# Patient Record
Sex: Male | Born: 1955 | Race: Black or African American | Hispanic: No | Marital: Single | Smoking: Current every day smoker
Health system: Southern US, Community
[De-identification: ages and names within clinical notes are randomized; demographics above are authoritative.]

## PROBLEM LIST (undated history)

## (undated) DIAGNOSIS — I10 Essential (primary) hypertension: Secondary | ICD-10-CM

## (undated) DIAGNOSIS — T7840XA Allergy, unspecified, initial encounter: Secondary | ICD-10-CM

## (undated) DIAGNOSIS — E785 Hyperlipidemia, unspecified: Secondary | ICD-10-CM

## (undated) DIAGNOSIS — Z86010 Personal history of colonic polyps: Secondary | ICD-10-CM

## (undated) HISTORY — PX: POLYPECTOMY: SHX149

## (undated) HISTORY — PX: TONSILLECTOMY: SUR1361

## (undated) HISTORY — DX: Essential (primary) hypertension: I10

## (undated) HISTORY — DX: Hyperlipidemia, unspecified: E78.5

## (undated) HISTORY — PX: COLONOSCOPY: SHX174

## (undated) HISTORY — DX: Allergy, unspecified, initial encounter: T78.40XA

## (undated) HISTORY — DX: Personal history of colonic polyps: Z86.010

---

## 2016-12-22 ENCOUNTER — Encounter: Payer: Self-pay | Admitting: Family Medicine

## 2016-12-30 ENCOUNTER — Encounter: Payer: Self-pay | Admitting: Internal Medicine

## 2017-02-14 ENCOUNTER — Ambulatory Visit (AMBULATORY_SURGERY_CENTER): Payer: Self-pay | Admitting: *Deleted

## 2017-02-14 VITALS — Ht 73.0 in | Wt 195.2 lb

## 2017-02-14 DIAGNOSIS — Z1211 Encounter for screening for malignant neoplasm of colon: Secondary | ICD-10-CM

## 2017-02-14 NOTE — Progress Notes (Signed)
No egg or soy allergy known to patient  No issues with past sedation with any surgeries  or procedures, no intubation problems  No diet pills per patient No home 02 use per patient  No blood thinners per patient  Pt denies issues with constipation  No A fib or A flutter  EMMI video sent to pt's e mail  

## 2017-02-20 ENCOUNTER — Telehealth: Payer: Self-pay | Admitting: Internal Medicine

## 2017-02-21 NOTE — Telephone Encounter (Signed)
Called pt and obtained email. Re-entered emmi Angela/PV

## 2017-02-22 ENCOUNTER — Telehealth: Payer: Self-pay | Admitting: Internal Medicine

## 2017-02-22 NOTE — Telephone Encounter (Signed)
Corrected information on EMMI. Notified patient that he should be able to access this now.

## 2017-02-28 ENCOUNTER — Encounter: Payer: Self-pay | Admitting: Internal Medicine

## 2017-02-28 ENCOUNTER — Ambulatory Visit (AMBULATORY_SURGERY_CENTER): Payer: Self-pay | Admitting: Internal Medicine

## 2017-02-28 ENCOUNTER — Other Ambulatory Visit: Payer: Self-pay | Admitting: Internal Medicine

## 2017-02-28 VITALS — BP 113/77 | HR 70 | Temp 99.3°F | Resp 13 | Ht 73.0 in | Wt 195.0 lb

## 2017-02-28 DIAGNOSIS — Z1211 Encounter for screening for malignant neoplasm of colon: Secondary | ICD-10-CM

## 2017-02-28 DIAGNOSIS — D125 Benign neoplasm of sigmoid colon: Secondary | ICD-10-CM

## 2017-02-28 DIAGNOSIS — Z1212 Encounter for screening for malignant neoplasm of rectum: Secondary | ICD-10-CM

## 2017-02-28 DIAGNOSIS — D124 Benign neoplasm of descending colon: Secondary | ICD-10-CM

## 2017-02-28 DIAGNOSIS — D123 Benign neoplasm of transverse colon: Secondary | ICD-10-CM

## 2017-02-28 MED ORDER — SODIUM CHLORIDE 0.9 % IV SOLN: 500.0000 mL | INTRAVENOUS | Status: DC

## 2017-02-28 NOTE — Progress Notes (Signed)
A and O x3. Report to RN. Tolerated MAC anesthesia well.

## 2017-02-28 NOTE — Patient Instructions (Addendum)
I found and removed 10 polyps today. I will let you know pathology results and when to have another routine colonoscopy by mail and/or My Chart.  You also have a condition called diverticulosis - common and not usually a problem. Please read the handout provided. I also saw enlarged hemorrhoids inside.  I appreciate the opportunity to care for you. Gatha Mayer, MD, FACG   YOU HAD AN ENDOSCOPIC PROCEDURE TODAY AT [facility]:   Refer to the procedure report that was given to you for any specific questions about what was found during the examination.  If the procedure report does not answer your questions, please call your gastroenterologist to clarify.  If you requested that your care partner not be given the details of your procedure findings, then the procedure report has been included in a sealed envelope for you to review at your convenience later.  YOU SHOULD EXPECT: Some feelings of bloating in the abdomen. Passage of more gas than usual.  Walking can help get rid of the air that was put into your GI tract during the procedure and reduce the bloating. If you had a lower endoscopy (such as a colonoscopy or flexible sigmoidoscopy) you may notice spotting of blood in your stool or on the toilet paper. If you underwent a bowel prep for your procedure, you may not have a normal bowel movement for a few days.  Please Note:  You might notice some irritation and congestion in your nose or some drainage.  This is from the oxygen used during your procedure.  There is no need for concern and it should clear up in a day or so.  SYMPTOMS TO REPORT IMMEDIATELY:   Following lower endoscopy (colonoscopy or flexible sigmoidoscopy):  Excessive amounts of blood in the stool  Significant tenderness or worsening of abdominal pains  Swelling of the abdomen that is new, acute  Fever of 100F or higher   For urgent or emergent issues, a gastroenterologist can be reached at any  hour by calling [phone].   No ASA products or NSAIDS for 2 weeks! Please read all handouts given to you by your recovery nurse.   DIET:  We do recommend a small meal at first, but then you may proceed to your regular diet.  Drink plenty of fluids but you should avoid alcoholic beverages for 24 hours.  ACTIVITY:  You should plan to take it easy for the rest of today and you should NOT DRIVE or use heavy machinery until tomorrow (because of the sedation medicines used during the test).    FOLLOW UP: Our staff will call the number listed on your records the next business day following your procedure to check on you and address any questions or concerns that you may have regarding the information given to you following your procedure. If we do not reach you, we will leave a message.  However, if you are feeling well and you are not experiencing any problems, there is no need to return our call.  We will assume that you have returned to your regular daily activities without incident.  If any biopsies were taken you will be contacted by phone or by letter within the next 1-3 weeks.  Please call us at [phone] if you have not heard about the biopsies in 3 weeks.    SIGNATURES/CONFIDENTIALITY: You and/or your care partner have signed paperwork which will be entered into your electronic medical record.  These signatures attest to  the fact that that the information above on your After Visit Summary has been reviewed and is understood.  Full responsibility of the confidentiality of this discharge information lies with you and/or your care-partner.  Thank you for letting us take care of your healthcare needs today.

## 2017-02-28 NOTE — Progress Notes (Signed)
Called to room to assist during endoscopic procedure.  Patient ID and intended procedure confirmed with present staff. Received instructions for my participation in the procedure from the performing physician.  

## 2017-02-28 NOTE — Op Note (Signed)
[facility] Patient Name: Samuel Bates Procedure Date: 02/28/2017 2:50 PM MRN: [id number] Endoscopist: Gatha Mayer , MD Age: 61 Referring MD:  Date of Birth: 1956/04/24 Gender: Male Account #: [id number] Procedure:                Colonoscopy Indications:              Screening for colorectal malignant neoplasm, This                            is the patient's first colonoscopy Medicines:                Propofol per Anesthesia, Monitored Anesthesia Care Procedure:                Pre-Anesthesia Assessment:                           - Prior to the procedure, a History and Physical                            was performed, and patient medications and                            allergies were reviewed. The patient's tolerance of                            previous anesthesia was also reviewed. The risks                            and benefits of the procedure and the sedation                            options and risks were discussed with the patient.                            All questions were answered, and informed consent                            was obtained. Prior Anticoagulants: The patient has                            taken no previous anticoagulant or antiplatelet                            agents. ASA Grade Assessment: II - A patient with                            mild systemic disease. After reviewing the risks                            and benefits, the patient was deemed in                            satisfactory condition to undergo the procedure.  After obtaining informed consent, the colonoscope                            was passed under direct vision. Throughout the                            procedure, the patient's blood pressure, pulse, and                            oxygen saturations were monitored continuously. The                            Colonoscope was introduced through the anus and   advanced to the the cecum, identified by                            appendiceal orifice and ileocecal valve. The                            colonoscopy was performed without difficulty. The                            patient tolerated the procedure well. The quality                            of the bowel preparation was good. The bowel                            preparation used was Miralax. The ileocecal valve,                            appendiceal orifice, and rectum were photographed. Scope In: 3:00:28 PM Scope Out: 3:25:13 PM Scope Withdrawal Time: 0 hours 20 minutes 34 seconds  Total Procedure Duration: 0 hours 24 minutes 45 seconds  Findings:                 The perianal and digital rectal examinations were                            normal. Pertinent negatives include normal prostate                            (size, shape, and consistency).                           Two pedunculated polyps were found in the sigmoid                            colon. The polyps were 12 to 15 mm in size. These                            polyps were removed with a hot snare. Resection and  retrieval were complete. Verification of patient                            identification for the specimen was done. Estimated                            blood loss: none.                           Eight sessile polyps were found in the descending                            colon and transverse colon. The polyps were 3 to 8                            mm in size. These polyps were removed with a cold                            snare. Resection and retrieval were complete.                            Verification of patient identification for the                            specimen was done. Estimated blood loss was minimal.                           Many small and large-mouthed diverticula were found                            in the sigmoid colon.                           Internal  hemorrhoids were found during retroflexion.                           The exam was otherwise without abnormality on                            direct and retroflexion views. Complications:            No immediate complications. Estimated Blood Loss:     Estimated blood loss was minimal. Impression:               - Two 12 to 15 mm polyps in the sigmoid colon,                            removed with a hot snare. Resected and retrieved.                            Proximal 12 mm and distal 15 mm - separate bottles                           - Eight 3 to 8 mm polyps in the  descending colon                            and in the transverse colon, removed with a cold                            snare. Resected and retrieved.                           10 POLYPS TOTAL                           - Diverticulosis in the sigmoid colon.                           - Internal hemorrhoids.                           - The examination was otherwise normal on direct                            and retroflexion views. Recommendation:           - Patient has a contact number available for                            emergencies. The signs and symptoms of potential                            delayed complications were discussed with the                            patient. Return to normal activities tomorrow.                            Written discharge instructions were provided to the                            patient.                           - Resume previous diet.                           - Continue present medications.                           - No aspirin, ibuprofen, naproxen, or other                            non-steroidal anti-inflammatory drugs for 2 weeks                            after polyp removal.                           - Repeat colonoscopy date to be determined after  pending pathology results are reviewed for                            surveillance. Gatha Mayer, MD 02/28/2017 3:35:22 PM This report has been signed electronically.

## 2017-03-01 ENCOUNTER — Telehealth: Payer: Self-pay | Admitting: *Deleted

## 2017-03-01 NOTE — Telephone Encounter (Signed)
  Follow up Call-  Call back number 02/28/2017  Post procedure Call Back phone  # [phone]  Permission to leave phone message Yes  Some recent data might be hidden     Patient questions:  Do you have a fever, pain , or abdominal swelling? No. Pain Score  0 *  Have you tolerated food without any problems? Yes.    Have you been able to return to your normal activities? Yes.    Do you have any questions about your discharge instructions: Diet   No. Medications  No. Follow up visit  No.  Do you have questions or concerns about your Care? No.  Actions: * If pain score is 4 or above: No action needed, pain <4.

## 2017-03-07 ENCOUNTER — Encounter: Payer: Self-pay | Admitting: Internal Medicine

## 2017-03-07 DIAGNOSIS — Z86010 Personal history of colonic polyps: Secondary | ICD-10-CM

## 2017-03-07 DIAGNOSIS — Z860101 Personal history of adenomatous and serrated colon polyps: Secondary | ICD-10-CM

## 2017-03-07 HISTORY — DX: Personal history of colonic polyps: Z86.010

## 2017-03-07 HISTORY — DX: Personal history of adenomatous and serrated colon polyps: Z86.0101

## 2018-02-28 ENCOUNTER — Encounter: Payer: Self-pay | Admitting: Internal Medicine

## 2018-03-30 ENCOUNTER — Other Ambulatory Visit: Payer: Self-pay

## 2018-03-30 ENCOUNTER — Ambulatory Visit (AMBULATORY_SURGERY_CENTER): Payer: Self-pay | Admitting: *Deleted

## 2018-03-30 VITALS — Ht 73.0 in | Wt 203.0 lb

## 2018-03-30 DIAGNOSIS — Z86010 Personal history of colonic polyps: Secondary | ICD-10-CM

## 2018-03-30 MED ORDER — BISACODYL 5 MG PO TBEC
DELAYED_RELEASE_TABLET | ORAL | 0 refills | Status: DC
Start: 2018-03-30 — End: 2018-04-09

## 2018-03-30 MED ORDER — POLYETHYLENE GLYCOL 3350 17 GM/SCOOP PO POWD: ORAL | 0 refills | Status: DC

## 2018-03-30 NOTE — Progress Notes (Signed)
No egg or soy allergy known to patient  No issues with past sedation with any surgeries  or procedures, no intubation problems  No diet pills per patient No home 02 use per patient  No blood thinners per patient  Pt denies issues with constipation  No A fib or A flutter  EMMI video offered and declined by the patient. Rx for Miralax and Ducolax printed for the patient to take to his pharmacy.(patient has an [place] card)

## 2018-04-09 ENCOUNTER — Ambulatory Visit (AMBULATORY_SURGERY_CENTER): Payer: Self-pay | Admitting: Internal Medicine

## 2018-04-09 ENCOUNTER — Encounter: Payer: Self-pay | Admitting: Internal Medicine

## 2018-04-09 VITALS — BP 111/72 | HR 74 | Temp 99.6°F | Resp 18 | Ht 73.0 in | Wt 203.0 lb

## 2018-04-09 DIAGNOSIS — Z860100 Personal history of colon polyps, unspecified: Secondary | ICD-10-CM

## 2018-04-09 DIAGNOSIS — D122 Benign neoplasm of ascending colon: Secondary | ICD-10-CM

## 2018-04-09 DIAGNOSIS — D126 Benign neoplasm of colon, unspecified: Secondary | ICD-10-CM

## 2018-04-09 DIAGNOSIS — D125 Benign neoplasm of sigmoid colon: Secondary | ICD-10-CM

## 2018-04-09 DIAGNOSIS — Z860101 Personal history of adenomatous and serrated colon polyps: Secondary | ICD-10-CM

## 2018-04-09 DIAGNOSIS — K635 Polyp of colon: Secondary | ICD-10-CM

## 2018-04-09 DIAGNOSIS — D123 Benign neoplasm of transverse colon: Secondary | ICD-10-CM

## 2018-04-09 MED ORDER — SODIUM CHLORIDE 0.9 % IV SOLN: 500.0000 mL | Freq: Once | INTRAVENOUS | Status: DC

## 2018-04-09 NOTE — Progress Notes (Signed)
Pt's states no medical or surgical changes since previsit or office visit. 

## 2018-04-09 NOTE — Progress Notes (Signed)
A/ox3 pleased with MAC, report to RN 

## 2018-04-09 NOTE — Patient Instructions (Addendum)
I removed 5 tiny polyps today and saw the internal hemorrhoids (these are cause of bleeding you described).   I will let you know pathology results and when to have another routine colonoscopy by mail and/or My Chart.  I appreciate the opportunity to care for you. Gatha Mayer, MD, FACG   YOU HAD AN ENDOSCOPIC PROCEDURE TODAY AT [facility]:   Refer to the procedure report that was given to you for any specific questions about what was found during the examination.  If the procedure report does not answer your questions, please call your gastroenterologist to clarify.  If you requested that your care partner not be given the details of your procedure findings, then the procedure report has been included in a sealed envelope for you to review at your convenience later.  YOU SHOULD EXPECT: Some feelings of bloating in the abdomen. Passage of more gas than usual.  Walking can help get rid of the air that was put into your GI tract during the procedure and reduce the bloating. If you had a lower endoscopy (such as a colonoscopy or flexible sigmoidoscopy) you may notice spotting of blood in your stool or on the toilet paper. If you underwent a bowel prep for your procedure, you may not have a normal bowel movement for a few days.  Please Note:  You might notice some irritation and congestion in your nose or some drainage.  This is from the oxygen used during your procedure.  There is no need for concern and it should clear up in a day or so.  SYMPTOMS TO REPORT IMMEDIATELY:   Following lower endoscopy (colonoscopy or flexible sigmoidoscopy):  Excessive amounts of blood in the stool  Significant tenderness or worsening of abdominal pains  Swelling of the abdomen that is new, acute  Fever of 100F or higher    For urgent or emergent issues, a gastroenterologist can be reached at any hour by calling [phone].   DIET:  We do recommend a small meal at first, but  then you may proceed to your regular diet.  Drink plenty of fluids but you should avoid alcoholic beverages for 24 hours.  ACTIVITY:  You should plan to take it easy for the rest of today and you should NOT DRIVE or use heavy machinery until tomorrow (because of the sedation medicines used during the test).    FOLLOW UP: Our staff will call the number listed on your records the next business day following your procedure to check on you and address any questions or concerns that you may have regarding the information given to you following your procedure. If we do not reach you, we will leave a message.  However, if you are feeling well and you are not experiencing any problems, there is no need to return our call.  We will assume that you have returned to your regular daily activities without incident.  If any biopsies were taken you will be contacted by phone or by letter within the next 1-3 weeks.  Please call us at [phone] if you have not heard about the biopsies in 3 weeks.    SIGNATURES/CONFIDENTIALITY: You and/or your care partner have signed paperwork which will be entered into your electronic medical record.  These signatures attest to the fact that that the information above on your After Visit Summary has been reviewed and is understood.  Full responsibility of the confidentiality of this discharge information lies with you and/or your  care-partner.

## 2018-04-09 NOTE — Progress Notes (Signed)
Called to room to assist during endoscopic procedure.  Patient ID and intended procedure confirmed with present staff. Received instructions for my participation in the procedure from the performing physician.  

## 2018-04-10 ENCOUNTER — Telehealth: Payer: Self-pay

## 2018-04-10 NOTE — Telephone Encounter (Signed)
  Follow up Call-  Call back number 04/09/2018 02/28/2017  Post procedure Call Back phone  # [phone]  Permission to leave phone message Yes Yes  Some recent data might be hidden     Patient questions:  Do you have a fever, pain , or abdominal swelling? No. Pain Score  0 *  Have you tolerated food without any problems? Yes.    Have you been able to return to your normal activities? Yes.    Do you have any questions about your discharge instructions: Diet   No. Medications  No. Follow up visit  No.  Do you have questions or concerns about your Care? No.  Actions: * If pain score is 4 or above: No action needed, pain <4.

## 2018-04-25 ENCOUNTER — Encounter: Payer: Self-pay | Admitting: Internal Medicine

## 2018-04-25 NOTE — Op Note (Signed)
[facility] Patient Name: Samuel Bates Procedure Date: 04/09/2018 4:18 PM MRN: [id number] Endoscopist: Gatha Mayer , MD Age: 62 Referring MD:  Date of Birth: 28-Jan-1956 Gender: Male Account #: [id number] Procedure:                Colonoscopy Indications:              Surveillance: History of numerous (> 10) adenomas                            on last colonoscopy (< 3 yrs) Medicines:                Propofol per Anesthesia, Monitored Anesthesia Care Procedure:                Pre-Anesthesia Assessment:                           - Prior to the procedure, a History and Physical                            was performed, and patient medications and                            allergies were reviewed. The patient's tolerance of                            previous anesthesia was also reviewed. The risks                            and benefits of the procedure and the sedation                            options and risks were discussed with the patient.                            All questions were answered, and informed consent                            was obtained. Prior Anticoagulants: The patient has                            taken no previous anticoagulant or antiplatelet                            agents. ASA Grade Assessment: II - A patient with                            mild systemic disease. After reviewing the risks                            and benefits, the patient was deemed in                            satisfactory condition to undergo the procedure.  After obtaining informed consent, the colonoscope                            was passed under direct vision. Throughout the                            procedure, the patient's blood pressure, pulse, and                            oxygen saturations were monitored continuously. The                            Colonoscope was introduced through the anus and                            advanced  to the the cecum, identified by                            appendiceal orifice and ileocecal valve. The                            colonoscopy was performed without difficulty. The                            patient tolerated the procedure well. The quality                            of the bowel preparation was good. The ileocecal                            valve, appendiceal orifice, and rectum were                            photographed. Scope In: 4:31:50 PM Scope Out: 4:49:08 PM Scope Withdrawal Time: 0 hours 12 minutes 36 seconds  Total Procedure Duration: 0 hours 17 minutes 18 seconds  Findings:                 The perianal and digital rectal examinations were                            normal.                           Five sessile polyps were found in the sigmoid                            colon, transverse colon and ascending colon. The                            polyps were diminutive in size. These polyps were                            removed with a cold snare. Resection and retrieval  were complete. Verification of patient                            identification for the specimen was done. Estimated                            blood loss was minimal.                           Multiple diverticula were found in the sigmoid                            colon.                           Internal hemorrhoids were found.                           The exam was otherwise without abnormality on                            direct and retroflexion views. Complications:            No immediate complications. Estimated Blood Loss:     Estimated blood loss was minimal. Impression:               - Five diminutive polyps in the sigmoid colon, in                            the transverse colon and in the ascending colon,                            removed with a cold snare. Resected and retrieved.                           - Diverticulosis in the sigmoid colon.                            - Internal hemorrhoids.                           - The examination was otherwise normal on direct                            and retroflexion views.                           - Personal history of colonic polyps. 10 adenomas                            2018 Recommendation:           - Patient has a contact number available for                            emergencies. The signs and symptoms of potential  delayed complications were discussed with the                            patient. Return to normal activities tomorrow.                            Written discharge instructions were provided to the                            patient.                           - Resume previous diet.                           - Continue present medications.                           - Repeat colonoscopy is recommended for                            surveillance. The colonoscopy date will be                            determined after pathology results from today's                            exam become available for review. Gatha Mayer, MD 04/25/2018 8:10:05 AM This report has been signed electronically.

## 2018-04-25 NOTE — Progress Notes (Signed)
2 adenomas 3 hyperplastic Recall 2022

## 2018-07-28 ENCOUNTER — Encounter (HOSPITAL_COMMUNITY): Payer: Self-pay

## 2018-07-28 ENCOUNTER — Emergency Department (HOSPITAL_COMMUNITY): Payer: Medicaid Other

## 2018-07-28 DIAGNOSIS — N186 End stage renal disease: Secondary | ICD-10-CM | POA: Diagnosis present

## 2018-07-28 DIAGNOSIS — A419 Sepsis, unspecified organism: Secondary | ICD-10-CM | POA: Diagnosis not present

## 2018-07-28 DIAGNOSIS — I5082 Biventricular heart failure: Secondary | ICD-10-CM | POA: Diagnosis present

## 2018-07-28 DIAGNOSIS — Z8371 Family history of colonic polyps: Secondary | ICD-10-CM | POA: Diagnosis not present

## 2018-07-28 DIAGNOSIS — E875 Hyperkalemia: Secondary | ICD-10-CM | POA: Diagnosis not present

## 2018-07-28 DIAGNOSIS — I081 Rheumatic disorders of both mitral and tricuspid valves: Secondary | ICD-10-CM | POA: Diagnosis present

## 2018-07-28 DIAGNOSIS — Z452 Encounter for adjustment and management of vascular access device: Secondary | ICD-10-CM

## 2018-07-28 DIAGNOSIS — I361 Nonrheumatic tricuspid (valve) insufficiency: Secondary | ICD-10-CM

## 2018-07-28 DIAGNOSIS — K922 Gastrointestinal hemorrhage, unspecified: Secondary | ICD-10-CM | POA: Diagnosis not present

## 2018-07-28 DIAGNOSIS — Z515 Encounter for palliative care: Secondary | ICD-10-CM | POA: Diagnosis not present

## 2018-07-28 DIAGNOSIS — E871 Hypo-osmolality and hyponatremia: Secondary | ICD-10-CM | POA: Diagnosis not present

## 2018-07-28 DIAGNOSIS — I255 Ischemic cardiomyopathy: Secondary | ICD-10-CM | POA: Diagnosis present

## 2018-07-28 DIAGNOSIS — Z888 Allergy status to other drugs, medicaments and biological substances status: Secondary | ICD-10-CM | POA: Diagnosis not present

## 2018-07-28 DIAGNOSIS — Z681 Body mass index (BMI) 19 or less, adult: Secondary | ICD-10-CM | POA: Diagnosis not present

## 2018-07-28 DIAGNOSIS — E785 Hyperlipidemia, unspecified: Secondary | ICD-10-CM | POA: Diagnosis present

## 2018-07-28 DIAGNOSIS — F17290 Nicotine dependence, other tobacco product, uncomplicated: Secondary | ICD-10-CM | POA: Diagnosis present

## 2018-07-28 DIAGNOSIS — I214 Non-ST elevation (NSTEMI) myocardial infarction: Secondary | ICD-10-CM | POA: Diagnosis present

## 2018-07-28 DIAGNOSIS — J9601 Acute respiratory failure with hypoxia: Secondary | ICD-10-CM | POA: Diagnosis not present

## 2018-07-28 DIAGNOSIS — R34 Anuria and oliguria: Secondary | ICD-10-CM | POA: Diagnosis not present

## 2018-07-28 DIAGNOSIS — R0902 Hypoxemia: Secondary | ICD-10-CM

## 2018-07-28 DIAGNOSIS — I2119 ST elevation (STEMI) myocardial infarction involving other coronary artery of inferior wall: Secondary | ICD-10-CM | POA: Diagnosis present

## 2018-07-28 DIAGNOSIS — D696 Thrombocytopenia, unspecified: Secondary | ICD-10-CM | POA: Diagnosis not present

## 2018-07-28 DIAGNOSIS — D631 Anemia in chronic kidney disease: Secondary | ICD-10-CM | POA: Diagnosis present

## 2018-07-28 DIAGNOSIS — D500 Iron deficiency anemia secondary to blood loss (chronic): Secondary | ICD-10-CM | POA: Diagnosis present

## 2018-07-28 DIAGNOSIS — R570 Cardiogenic shock: Secondary | ICD-10-CM | POA: Diagnosis present

## 2018-07-28 DIAGNOSIS — I472 Ventricular tachycardia, unspecified: Secondary | ICD-10-CM

## 2018-07-28 DIAGNOSIS — I340 Nonrheumatic mitral (valve) insufficiency: Secondary | ICD-10-CM

## 2018-07-28 DIAGNOSIS — R079 Chest pain, unspecified: Secondary | ICD-10-CM | POA: Diagnosis present

## 2018-07-28 DIAGNOSIS — Z823 Family history of stroke: Secondary | ICD-10-CM | POA: Diagnosis not present

## 2018-07-28 DIAGNOSIS — D649 Anemia, unspecified: Secondary | ICD-10-CM

## 2018-07-28 DIAGNOSIS — N185 Chronic kidney disease, stage 5: Secondary | ICD-10-CM

## 2018-07-28 DIAGNOSIS — I132 Hypertensive heart and chronic kidney disease with heart failure and with stage 5 chronic kidney disease, or end stage renal disease: Secondary | ICD-10-CM | POA: Diagnosis present

## 2018-07-28 DIAGNOSIS — I5021 Acute systolic (congestive) heart failure: Secondary | ICD-10-CM

## 2018-07-28 DIAGNOSIS — Z880 Allergy status to penicillin: Secondary | ICD-10-CM

## 2018-07-28 DIAGNOSIS — Z825 Family history of asthma and other chronic lower respiratory diseases: Secondary | ICD-10-CM | POA: Diagnosis not present

## 2018-07-28 DIAGNOSIS — N179 Acute kidney failure, unspecified: Secondary | ICD-10-CM

## 2018-07-28 DIAGNOSIS — I249 Acute ischemic heart disease, unspecified: Secondary | ICD-10-CM

## 2018-07-28 DIAGNOSIS — M109 Gout, unspecified: Secondary | ICD-10-CM | POA: Diagnosis not present

## 2018-07-28 DIAGNOSIS — G629 Polyneuropathy, unspecified: Secondary | ICD-10-CM | POA: Diagnosis not present

## 2018-07-28 DIAGNOSIS — R6521 Severe sepsis with septic shock: Secondary | ICD-10-CM | POA: Diagnosis not present

## 2018-07-28 DIAGNOSIS — Z66 Do not resuscitate: Secondary | ICD-10-CM | POA: Diagnosis not present

## 2018-07-28 DIAGNOSIS — K7200 Acute and subacute hepatic failure without coma: Secondary | ICD-10-CM | POA: Diagnosis not present

## 2018-07-28 DIAGNOSIS — I493 Ventricular premature depolarization: Secondary | ICD-10-CM | POA: Diagnosis not present

## 2018-07-28 DIAGNOSIS — T380X5A Adverse effect of glucocorticoids and synthetic analogues, initial encounter: Secondary | ICD-10-CM | POA: Diagnosis not present

## 2018-07-28 DIAGNOSIS — I5043 Acute on chronic combined systolic (congestive) and diastolic (congestive) heart failure: Secondary | ICD-10-CM | POA: Diagnosis present

## 2018-07-28 DIAGNOSIS — I509 Heart failure, unspecified: Secondary | ICD-10-CM

## 2018-07-28 DIAGNOSIS — E43 Unspecified severe protein-calorie malnutrition: Secondary | ICD-10-CM | POA: Diagnosis present

## 2018-07-28 DIAGNOSIS — J9690 Respiratory failure, unspecified, unspecified whether with hypoxia or hypercapnia: Secondary | ICD-10-CM

## 2018-07-28 DIAGNOSIS — N170 Acute kidney failure with tubular necrosis: Secondary | ICD-10-CM | POA: Diagnosis present

## 2018-07-28 DIAGNOSIS — I2510 Atherosclerotic heart disease of native coronary artery without angina pectoris: Secondary | ICD-10-CM | POA: Diagnosis present

## 2018-07-28 DIAGNOSIS — R739 Hyperglycemia, unspecified: Secondary | ICD-10-CM | POA: Diagnosis present

## 2018-07-28 LAB — CK: CK TOTAL: 608 U/L — AB (ref 49–397)

## 2018-07-28 LAB — DIFFERENTIAL
BASOS ABS: 0.1 10*3/uL (ref 0.0–0.1)
Band Neutrophils: 0 %
Basophils Relative: 0 %
Blasts: 0 %
Eosinophils Absolute: 0 10*3/uL (ref 0.0–0.5)
Eosinophils Relative: 0 %
Lymphocytes Relative: 8 %
Lymphs Abs: 8.7 10*3/uL — ABNORMAL HIGH (ref 0.7–4.0)
METAMYELOCYTES PCT: 2 %
Monocytes Absolute: 11.5 10*3/uL — ABNORMAL HIGH (ref 0.1–1.0)
Monocytes Relative: 4 %
Myelocytes: 0 %
Neutro Abs: 78.6 10*3/uL — ABNORMAL HIGH (ref 1.7–7.7)
Neutrophils Relative %: 86 %
Other: 0 %
Promyelocytes Relative: 0 %
nRBC: 88 /100 WBC — ABNORMAL HIGH

## 2018-07-28 LAB — I-STAT TROPONIN, ED
Troponin i, poc: 28.64 ng/mL (ref 0.00–0.08)
Troponin i, poc: 26.71 ng/mL (ref 0.00–0.08)

## 2018-07-28 LAB — CBC
HCT: 30.2 % — ABNORMAL LOW (ref 39.0–52.0)
Hemoglobin: 9.3 g/dL — ABNORMAL LOW (ref 13.0–17.0)
MCH: 21 pg — AB (ref 26.0–34.0)
MCHC: 30.8 g/dL (ref 30.0–36.0)
MCV: 68.3 fL — ABNORMAL LOW (ref 80.0–100.0)
Platelets: ADEQUATE 10*3/uL (ref 150–400)
RBC: 4.42 MIL/uL (ref 4.22–5.81)
RDW: 21.4 % — ABNORMAL HIGH (ref 11.5–15.5)
WBC: 9.6 10*3/uL (ref 4.0–10.5)
nRBC: 33.6 % — ABNORMAL HIGH (ref 0.0–0.2)

## 2018-07-28 LAB — BASIC METABOLIC PANEL
Anion gap: 18 — ABNORMAL HIGH (ref 5–15)
BUN: 198 mg/dL — ABNORMAL HIGH (ref 8–23)
CO2: 21 mmol/L — ABNORMAL LOW (ref 22–32)
Calcium: 7.8 mg/dL — ABNORMAL LOW (ref 8.9–10.3)
Chloride: 89 mmol/L — ABNORMAL LOW (ref 98–111)
Creatinine, Ser: 3.91 mg/dL — ABNORMAL HIGH (ref 0.61–1.24)
GFR calc Af Amer: 18 mL/min — ABNORMAL LOW (ref >60)
GFR calc non Af Amer: 15 mL/min — ABNORMAL LOW (ref >60)
Glucose, Bld: 154 mg/dL — ABNORMAL HIGH (ref 70–99)
Potassium: 5 mmol/L (ref 3.5–5.1)
Sodium: 128 mmol/L — ABNORMAL LOW (ref 135–145)

## 2018-07-28 LAB — RETICULOCYTES
IMMATURE RETIC FRACT: 32.6 % — AB (ref 2.3–15.9)
RBC.: 4.46 MIL/uL (ref 4.22–5.81)
Retic Count, Absolute: 95.4 10*3/uL (ref 19.0–186.0)
Retic Ct Pct: 2.1 % (ref 0.4–3.1)

## 2018-07-28 LAB — HEPATIC FUNCTION PANEL
ALT: 1058 U/L — ABNORMAL HIGH (ref 0–44)
AST: 1103 U/L — AB (ref 15–41)
Albumin: 3.6 g/dL (ref 3.5–5.0)
Alkaline Phosphatase: 86 U/L (ref 38–126)
BILIRUBIN DIRECT: 1.2 mg/dL — AB (ref 0.0–0.2)
BILIRUBIN INDIRECT: 1.8 mg/dL — AB (ref 0.3–0.9)
Total Bilirubin: 3 mg/dL — ABNORMAL HIGH (ref 0.3–1.2)
Total Protein: 7.2 g/dL (ref 6.5–8.1)

## 2018-07-28 LAB — DIC (DISSEMINATED INTRAVASCULAR COAGULATION)PANEL
D-Dimer, Quant: 12.38 ug/mL-FEU — ABNORMAL HIGH (ref 0.00–0.50)
Fibrinogen: 300 mg/dL (ref 210–475)
INR: 1.81
Prothrombin Time: 20.8 seconds — ABNORMAL HIGH (ref 11.4–15.2)
aPTT: 32 seconds (ref 24–36)

## 2018-07-28 LAB — URINALYSIS, ROUTINE W REFLEX MICROSCOPIC
Bilirubin Urine: NEGATIVE
Glucose, UA: NEGATIVE mg/dL
Hgb urine dipstick: NEGATIVE
KETONES UR: NEGATIVE mg/dL
Leukocytes, UA: NEGATIVE
Nitrite: NEGATIVE
PROTEIN: NEGATIVE mg/dL
Specific Gravity, Urine: 1.013 (ref 1.005–1.030)
pH: 5 (ref 5.0–8.0)

## 2018-07-28 LAB — IRON AND TIBC
Iron: 14 ug/dL — ABNORMAL LOW (ref 45–182)
Saturation Ratios: 3 % — ABNORMAL LOW (ref 17.9–39.5)
TIBC: 458 ug/dL — ABNORMAL HIGH (ref 250–450)
UIBC: 444 ug/dL

## 2018-07-28 LAB — COOXEMETRY PANEL
CARBOXYHEMOGLOBIN: 1.3 % (ref 0.5–1.5)
Methemoglobin: 1.8 % — ABNORMAL HIGH (ref 0.0–1.5)
O2 Saturation: 27.9 %
Total hemoglobin: 7.5 g/dL — ABNORMAL LOW (ref 12.0–16.0)

## 2018-07-28 LAB — NA AND K (SODIUM & POTASSIUM), RAND UR
Potassium Urine: 43 mmol/L
Sodium, Ur: <10 mmol/L

## 2018-07-28 LAB — FERRITIN: Ferritin: 28 ng/mL (ref 24–336)

## 2018-07-28 LAB — PROTIME-INR
INR: 1.83
Prothrombin Time: 20.9 seconds — ABNORMAL HIGH (ref 11.4–15.2)

## 2018-07-28 LAB — CBG MONITORING, ED: Glucose-Capillary: 148 mg/dL — ABNORMAL HIGH (ref 70–99)

## 2018-07-28 LAB — ECHOCARDIOGRAM COMPLETE

## 2018-07-28 LAB — LACTIC ACID, PLASMA
Lactic Acid, Venous: 2.2 mmol/L (ref 0.5–1.9)
Lactic Acid, Venous: 2.3 mmol/L (ref 0.5–1.9)

## 2018-07-28 LAB — MRSA PCR SCREENING: MRSA by PCR: NEGATIVE

## 2018-07-28 LAB — DIC (DISSEMINATED INTRAVASCULAR COAGULATION) PANEL
PLATELETS: 194 10*3/uL (ref 150–400)
SMEAR REVIEW: NONE SEEN

## 2018-07-28 LAB — TROPONIN I: Troponin I: 38.76 ng/mL (ref <0.03)

## 2018-07-28 LAB — AMMONIA: Ammonia: 21 umol/L (ref 9–35)

## 2018-07-28 LAB — CREATININE, URINE, RANDOM: Creatinine, Urine: 151.62 mg/dL

## 2018-07-28 LAB — BRAIN NATRIURETIC PEPTIDE: B Natriuretic Peptide: 4188.4 pg/mL — ABNORMAL HIGH (ref 0.0–100.0)

## 2018-07-28 LAB — FOLATE: Folate: 43.4 ng/mL (ref >5.9)

## 2018-07-28 LAB — VITAMIN B12: VITAMIN B 12: 5016 pg/mL — AB (ref 180–914)

## 2018-07-28 LAB — POC OCCULT BLOOD, ED: Fecal Occult Bld: POSITIVE — AB

## 2018-07-28 MED ORDER — VANCOMYCIN VARIABLE DOSE PER UNSTABLE RENAL FUNCTION (PHARMACIST DOSING): Status: DC

## 2018-07-28 MED ORDER — HEPARIN BOLUS VIA INFUSION
4000.0000 [IU] | Freq: Once | INTRAVENOUS | Status: DC
  Filled 2018-07-28: qty 4000

## 2018-07-28 MED ORDER — SODIUM CHLORIDE 0.9 % FOR CRRT
INTRAVENOUS_CENTRAL | Status: DC | PRN
  Administered 2018-07-30 – 2018-08-07 (×4): via INTRAVENOUS_CENTRAL
  Filled 2018-07-28 (×5): qty 1000

## 2018-07-28 MED ORDER — HEPARIN (PORCINE) 25000 UT/250ML-% IV SOLN
1100.0000 [IU]/h | INTRAVENOUS | Status: DC
  Filled 2018-07-28: qty 250

## 2018-07-28 MED ORDER — NITROGLYCERIN 0.4 MG SL SUBL: 0.4000 mg | SUBLINGUAL_TABLET | SUBLINGUAL | Status: DC | PRN

## 2018-07-28 MED ORDER — SODIUM CHLORIDE 0.9% FLUSH
3.0000 mL | Freq: Once | INTRAVENOUS | Status: AC
  Administered 2018-07-28: 3 mL via INTRAVENOUS

## 2018-07-28 MED ORDER — HEPARIN SODIUM (PORCINE) 1000 UNIT/ML DIALYSIS
1000.0000 [IU] | INTRAMUSCULAR | Status: DC | PRN
  Administered 2018-08-01: 2800 [IU] via INTRAVENOUS_CENTRAL
  Administered 2018-08-04 (×2): 2400 [IU] via INTRAVENOUS_CENTRAL
  Administered 2018-08-04: 3000 [IU] via INTRAVENOUS_CENTRAL
  Administered 2018-08-04 – 2018-08-06 (×6): 2400 [IU] via INTRAVENOUS_CENTRAL
  Administered 2018-08-10: 3600 [IU] via INTRAVENOUS_CENTRAL
  Filled 2018-07-28: qty 6
  Filled 2018-07-28: qty 3
  Filled 2018-07-28 (×2): qty 6
  Filled 2018-07-28: qty 4
  Filled 2018-07-28: qty 6
  Filled 2018-07-28: qty 3
  Filled 2018-07-28 (×3): qty 6
  Filled 2018-07-28: qty 3
  Filled 2018-07-28 (×6): qty 6

## 2018-07-28 MED ORDER — SODIUM CHLORIDE 0.9 % IV SOLN
2.0000 g | Freq: Two times a day (BID) | INTRAVENOUS | Status: DC
  Administered 2018-07-28 – 2018-07-29 (×3): 2 g via INTRAVENOUS
  Filled 2018-07-28 (×4): qty 2

## 2018-07-28 MED ORDER — NOREPINEPHRINE-SODIUM CHLORIDE 4-0.9 MG/250ML-% IV SOLN
0.0000 ug/min | INTRAVENOUS | Status: DC
  Administered 2018-07-28 – 2018-07-29 (×2): 5 ug/min via INTRAVENOUS
  Administered 2018-07-29: 17 ug/min via INTRAVENOUS
  Filled 2018-07-28 (×3): qty 250

## 2018-07-28 MED ORDER — HEPARIN (PORCINE) 25000 UT/250ML-% IV SOLN
1500.0000 [IU]/h | INTRAVENOUS | Status: DC
  Administered 2018-07-28: 1100 [IU]/h via INTRAVENOUS
  Administered 2018-07-29: 1400 [IU]/h via INTRAVENOUS
  Administered 2018-07-30 – 2018-07-31 (×3): 1500 [IU]/h via INTRAVENOUS
  Filled 2018-07-28 (×5): qty 250

## 2018-07-28 MED ORDER — PRISMASOL BGK 4/2.5 32-4-2.5 MEQ/L REPLACEMENT SOLN
Status: DC
  Administered 2018-07-28 – 2018-08-10 (×20): via INTRAVENOUS_CENTRAL
  Filled 2018-07-28 (×27): qty 5000

## 2018-07-28 MED ORDER — VANCOMYCIN HCL 10 G IV SOLR
2000.0000 mg | Freq: Once | INTRAVENOUS | Status: AC
  Administered 2018-07-28: 2000 mg via INTRAVENOUS
  Filled 2018-07-28 (×2): qty 2000

## 2018-07-28 MED ORDER — VANCOMYCIN HCL IN DEXTROSE 1-5 GM/200ML-% IV SOLN
1000.0000 mg | INTRAVENOUS | Status: DC
  Administered 2018-07-29: 1000 mg via INTRAVENOUS
  Filled 2018-07-28: qty 200

## 2018-07-28 MED ORDER — ONDANSETRON HCL 4 MG/2ML IJ SOLN: 4.0000 mg | Freq: Four times a day (QID) | INTRAMUSCULAR | Status: DC | PRN

## 2018-07-28 MED ORDER — SODIUM CHLORIDE 0.9 % IV SOLN
1.0000 g | INTRAVENOUS | Status: DC
  Filled 2018-07-28: qty 1

## 2018-07-28 MED ORDER — ASPIRIN EC 81 MG PO TBEC
81.0000 mg | DELAYED_RELEASE_TABLET | Freq: Every day | ORAL | Status: DC
  Administered 2018-07-29 – 2018-08-18 (×19): 81 mg via ORAL
  Filled 2018-07-28 (×21): qty 1

## 2018-07-28 MED ORDER — ACETAMINOPHEN 325 MG PO TABS: 650.0000 mg | ORAL_TABLET | ORAL | Status: DC | PRN

## 2018-07-28 MED ORDER — MILRINONE LACTATE IN DEXTROSE 20-5 MG/100ML-% IV SOLN
0.2500 ug/kg/min | INTRAVENOUS | Status: DC
  Administered 2018-07-28 – 2018-07-31 (×5): 0.125 ug/kg/min via INTRAVENOUS
  Administered 2018-08-01: 0.25 ug/kg/min via INTRAVENOUS
  Filled 2018-07-28 (×4): qty 100

## 2018-07-28 MED ORDER — NOREPINEPHRINE-SODIUM CHLORIDE 4-0.9 MG/250ML-% IV SOLN
INTRAVENOUS | Status: AC
  Filled 2018-07-28: qty 250

## 2018-07-28 MED ORDER — PANTOPRAZOLE SODIUM 40 MG IV SOLR
40.0000 mg | Freq: Two times a day (BID) | INTRAVENOUS | Status: DC
  Administered 2018-07-28 – 2018-08-01 (×8): 40 mg via INTRAVENOUS
  Filled 2018-07-28 (×8): qty 40

## 2018-07-28 MED ORDER — PRISMASOL BGK 4/2.5 32-4-2.5 MEQ/L IV SOLN
INTRAVENOUS | Status: DC
  Administered 2018-07-28 – 2018-07-29 (×6): via INTRAVENOUS_CENTRAL
  Filled 2018-07-28 (×14): qty 5000

## 2018-07-28 MED ORDER — ALTEPLASE 2 MG IJ SOLR
2.0000 mg | Freq: Once | INTRAMUSCULAR | Status: AC | PRN
  Administered 2018-08-04: 2 mg
  Filled 2018-07-28 (×2): qty 2

## 2018-07-28 MED ORDER — ATORVASTATIN CALCIUM 40 MG PO TABS: 40.0000 mg | ORAL_TABLET | Freq: Every day | ORAL | Status: DC

## 2018-07-28 MED ORDER — ASPIRIN 81 MG PO CHEW
324.0000 mg | CHEWABLE_TABLET | Freq: Once | ORAL | Status: AC
  Administered 2018-07-28: 324 mg via ORAL
  Filled 2018-07-28: qty 4

## 2018-07-28 MED ORDER — PRISMASOL BGK 4/2.5 32-4-2.5 MEQ/L REPLACEMENT SOLN
Status: DC
  Administered 2018-07-28 – 2018-08-09 (×12): via INTRAVENOUS_CENTRAL
  Filled 2018-07-28 (×14): qty 5000

## 2018-07-28 MED ORDER — HEPARIN BOLUS VIA INFUSION
4000.0000 [IU] | Freq: Once | INTRAVENOUS | Status: AC
  Administered 2018-07-28: 4000 [IU] via INTRAVENOUS
  Filled 2018-07-28: qty 4000

## 2018-07-28 NOTE — Progress Notes (Addendum)
Pharmacy Antibiotic Note  Samuel Bates is a 63 y.o. male admitted on 07/29/2018 with HCAP.  Pharmacy has been consulted for vancomycin and cefepime dosing.  Plan: Cefepime 1gm IV q24h Vancomycin 2000mg  IV x 1 then f/u per renal function and levels F/u renal function and clinical course.   Weight: 198 lb (89.8 kg)  Temp (24hrs), Avg:97.7 F (36.5 C), Min:97.7 F (36.5 C), Max:97.7 F (36.5 C)  Recent Labs  Lab 07/05/2018 1431  WBC 9.6  CREATININE 3.91*    Estimated Creatinine Clearance: 22.1 mL/min (A) (by C-G formula based on SCr of 3.91 mg/dL (H)).    Allergies  Allergen Reactions  . Penicillins Hives    Did it involve swelling of the face/tongue/throat, SOB, or low BP? YES Did it involve sudden or severe rash/hives, skin peeling, or any reaction on the inside of your mouth or nose? NO Did you need to seek medical attention at a hospital or doctor's office? YES When did it last happen? 30 years ago If all above answers are "NO", may proceed with cephalosporin use.  . Simvastatin Other (See Comments)    insomnia   Judeen Geralds A. Levada Dy, PharmD, [place] Pager: [phone] Please utilize Amion for appropriate phone number to reach the unit pharmacist ([place])  Addendum:  Patient starting CRRT. Will adjust cefepime and vancomycin for CRRT therapy.   Cefepime 2gm IV q12h Vancomycin 1000mg  IV q24h  Doctor Sheahan A. Levada Dy, PharmD, [place] Pager: [phone] Please utilize Amion for appropriate phone number to reach the unit pharmacist ([place])    07/09/2018 4:41 PM

## 2018-07-28 NOTE — Progress Notes (Signed)
ANTICOAGULATION CONSULT NOTE - Initial Consult  Pharmacy Consult for heparin Indication: Chest pain/ACS  Allergies  Allergen Reactions  . Penicillins Hives    Did it involve swelling of the face/tongue/throat, SOB, or low BP? YES Did it involve sudden or severe rash/hives, skin peeling, or any reaction on the inside of your mouth or nose? NO Did you need to seek medical attention at a hospital or doctor's office? YES When did it last happen? 30 years ago If all above answers are "NO", may proceed with cephalosporin use.  . Simvastatin Other (See Comments)    insomnia    Patient Measurements: Weight: 198 lb (89.8 kg) Heparin Dosing Weight: 89.8 kg  Vital Signs: Temp: 97.7 F (36.5 C) (01/25 1353) Temp Source: Rectal (01/25 1353) BP: 110/83 (01/25 1600) Pulse Rate: 88 (01/25 1500)  Labs: Recent Labs    07/16/2018 1431  HGB 9.3*  HCT 30.2*  PLT PLATELET CLUMPS NOTED ON SMEAR, COUNT APPEARS ADEQUATE  CREATININE 3.91*    Estimated Creatinine Clearance: 22.1 mL/min (A) (by C-G formula based on SCr of 3.91 mg/dL (H)).   Medical History: Past Medical History:  Diagnosis Date  . Allergy   . Hx of adenomatous colonic polyps 03/07/2017  . Hyperlipidemia   . Hypertension    Assessment: 63 yo male admitted with chest pain and r/o NSTEMI/ACS. MD wishes to start heparin drip. No anticoagulation noted PTA.    Goal of Therapy:  Heparin level 0.3-0.7 units/ml Monitor platelets by anticoagulation protocol: Yes   Plan:  Heparin bolus 4000 units IV x 1 Heparin drip 1100 units/hr HL in 6 hours HL and CBC daily Monitor for bleeding  Amayiah Gosnell A. Levada Dy, PharmD, [place] Pager: [phone] Please utilize Amion for appropriate phone number to reach the unit pharmacist ([place])   07/25/2018,4:30 PM

## 2018-07-28 NOTE — ED Notes (Signed)
Patient place on Zoll pads.

## 2018-07-28 NOTE — ED Provider Notes (Signed)
[facility] EMERGENCY DEPARTMENT Provider Note   CSN: [id number] Arrival date & time: 07/05/2018  1331     History   Chief Complaint Chief Complaint  Patient presents with  . Shortness of Breath    HPI Samuel Bates is a 63 y.o. male.  HPI   63 year old male with history hypertension, hyperlipidemia, smoking presents with concern for intermittent chest pain and shortness of breath.  Patient reports that symptoms began last Saturday, with nausea, vomiting, and cough productive of white mucus.  Monday night, he developed central chest pressure without radiation which was severe.  Reports after that episode, he has had intermittent chest pressure, which has come and gone.  Reports he is not had any chest pressure today.  Reports, however over the last few days it was worsened with laying down flat and ambulation.  Reports associated dyspnea which has continued through today.  Reports orthopnea, as well as dyspnea on exertion.  Reports cough has continued, but denies fevers.  Denies history of bleeding, black or bloody stools.  Emesis was yellow in color, no coffee ground emesis or bleeding. No diarrhea. Denies any difficulty urinating.  Reports no known fam hx of CAD Smokes black and milds but is decreasing. No etoh or drug use.  No hx of MI. Takes meds for htn and hlpd.   Past Medical History:  Diagnosis Date  . Allergy   . Hx of adenomatous colonic polyps 03/07/2017  . Hyperlipidemia   . Hypertension     Patient Active Problem List   Diagnosis Date Noted  . Hx of adenomatous colonic polyps 03/07/2017    Past Surgical History:  Procedure Laterality Date  . COLONOSCOPY    . POLYPECTOMY    . TONSILLECTOMY     age 12        Home Medications    Prior to Admission medications   Medication Sig Start Date End Date Taking? Authorizing Provider  amLODipine (NORVASC) 10 MG tablet Take 10 mg by mouth daily.   Yes [provider]  atorvastatin  (LIPITOR) 40 MG tablet Take 40 mg by mouth daily.    [provider]  Cetirizine HCl 10 MG CAPS Take 1 capsule by mouth as needed.    [provider]  hydrochlorothiazide (HYDRODIURIL) 25 MG tablet Take 25 mg by mouth daily.    [provider]  losartan (COZAAR) 50 MG tablet Take 50 mg by mouth daily.    [provider]  omega-3 fish oil (MAXEPA) 1000 MG CAPS capsule Take 2 capsules by mouth daily.    [provider]    Family History Family History  Problem Relation Age of Onset  . Colon polyps Brother   . Stroke Brother   . COPD Father   . COPD Sister   . Colon cancer Neg Hx   . Esophageal cancer Neg Hx   . Rectal cancer Neg Hx   . Stomach cancer Neg Hx   . Diabetes Neg Hx   . Colitis Neg Hx     Social History Social History   Tobacco Use  . Smoking status: Current Every Day Smoker    Types: Cigars  . Smokeless tobacco: Never Used  . Tobacco comment: has cut back not daily  Substance Use Topics  . Alcohol use: Yes    Alcohol/week: 2.0 standard drinks    Types: 2 Cans of beer per week    Comment: 2-3 beers per day  . Drug use: Yes  Types: Marijuana    Comment: had some Saturday 04/07/2018     Allergies   Penicillins and Simvastatin   Review of Systems Review of Systems  Constitutional: Positive for fatigue. Negative for fever.  Respiratory: Positive for cough and shortness of breath.   Cardiovascular: Positive for chest pain. Negative for leg swelling.  Gastrointestinal: Positive for nausea and vomiting. Negative for abdominal pain, blood in stool, constipation and diarrhea.  Genitourinary: Negative for difficulty urinating and dysuria.  Skin: Negative for rash.  Neurological: Negative for headaches.     Physical Exam Updated Vital Signs BP 108/81   Pulse 88   Temp 97.7 F (36.5 C) (Rectal)   Resp (!) 25   Wt 89.8 kg   SpO2 95%   BMI 26.12 kg/m   Physical Exam Vitals signs and nursing note reviewed.    Constitutional:      General: He is not in acute distress.    Appearance: He is well-developed. He is ill-appearing. He is not diaphoretic.  HENT:     Head: Normocephalic and atraumatic.  Eyes:     Conjunctiva/sclera: Conjunctivae normal.  Neck:     Musculoskeletal: Normal range of motion.  Cardiovascular:     Rate and Rhythm: Normal rate and regular rhythm.     Heart sounds: Normal heart sounds. No murmur. No friction rub. No gallop.   Pulmonary:     Effort: Pulmonary effort is normal. No respiratory distress.     Breath sounds: Rales present. No wheezing.  Abdominal:     General: There is no distension.     Palpations: Abdomen is soft.     Tenderness: There is no abdominal tenderness. There is no guarding.  Skin:    General: Skin is warm and dry.  Neurological:     Mental Status: He is alert and oriented to person, place, and time.      ED Treatments / Results  Labs (all labs ordered are listed, but only abnormal results are displayed) Labs Reviewed  BASIC METABOLIC PANEL - Abnormal; Notable for the following components:      Result Value   Sodium 128 (*)    Chloride 89 (*)    CO2 21 (*)    Glucose, Bld 154 (*)    BUN 198 (*)    Creatinine, Ser 3.91 (*)    Calcium 7.8 (*)    GFR calc non Af Amer 15 (*)    GFR calc Af Amer 18 (*)    Anion gap 18 (*)    All other components within normal limits  CBC - Abnormal; Notable for the following components:   Hemoglobin 9.3 (*)    HCT 30.2 (*)    MCV 68.3 (*)    MCH 21.0 (*)    RDW 21.4 (*)    nRBC 33.6 (*)    All other components within normal limits  I-STAT TROPONIN, ED - Abnormal; Notable for the following components:   Troponin i, poc 28.64 (*)    All other components within normal limits  CBG MONITORING, ED - Abnormal; Notable for the following components:   Glucose-Capillary 148 (*)    All other components within normal limits  I-STAT TROPONIN, ED - Abnormal; Notable for the following components:   Troponin  i, poc 26.71 (*)    All other components within normal limits  POC OCCULT BLOOD, ED - Abnormal; Notable for the following components:   Fecal Occult Bld POSITIVE (*)    All other components within normal  limits  CULTURE, BLOOD (ROUTINE X 2)  CULTURE, BLOOD (ROUTINE X 2)  VITAMIN B12  FOLATE  IRON AND TIBC  FERRITIN  RETICULOCYTES  URINALYSIS, ROUTINE W REFLEX MICROSCOPIC  DIFFERENTIAL  HEPATIC FUNCTION PANEL  DIC (DISSEMINATED INTRAVASCULAR COAGULATION) PANEL  PROTIME-INR  NA AND K (SODIUM & POTASSIUM), RAND UR  CREATININE, URINE, RANDOM  BRAIN NATRIURETIC PEPTIDE  CK  LACTIC ACID, PLASMA  LACTIC ACID, PLASMA  HEPARIN LEVEL (UNFRACTIONATED)  HEPARIN LEVEL (UNFRACTIONATED)  CBC    EKG EKG Interpretation  Date/Time:  Saturday July 28 2018 13:38:41 EST Ventricular Rate:  79 PR Interval:  164 QRS Duration: 150 QT Interval:  436 QTC Calculation: 499 R Axis:   170 Text Interpretation:  Normal sinus rhythm Possible Left atrial enlargement Non-specific intra-ventricular conduction block Right ventricular hypertrophy Cannot rule out Septal infarct , age undetermined Possible Lateral infarct , age undetermined Inferior infarct , age undetermined Abnormal ECG No previous ECGs available Confirmed by Gareth Morgan [phone]) on 07/06/2018 1:46:47 PM   Radiology Dg Chest 2 View  Result Date: 07/13/2018 CLINICAL DATA:  Shortness of breath for the past week with intermittent chest pain. EXAM: CHEST - 2 VIEW COMPARISON:  05/10/2006. FINDINGS: Stable enlarged cardiac silhouette. Interval band like area of patchy opacity in the right mid lung zone. Clear left lung. Stable mild diffuse prominence of the interstitial markings with normal vascularity. Decreased peribronchial thickening. Mild thoracolumbar spine degenerative changes. IMPRESSION: 1. Interval band like area of patchy opacity in the right mid lung zone. This could represent pneumonia, patchy atelectasis or small area of pulmonary  infarction. 2. Stable cardiomegaly and mild chronic interstitial lung disease. Electronically Signed   By: Claudie Revering M.D.   On: 07/20/2018 14:36    Procedures .Critical Care Performed by: Gareth Morgan, MD Authorized by: Gareth Morgan, MD   Critical care provider statement:    Critical care time (minutes):  45   Critical care was necessary to treat or prevent imminent or life-threatening deterioration of the following conditions:  Cardiac failure and circulatory failure   Critical care was time spent personally by me on the following activities:  Discussions with consultants, discussions with primary provider, evaluation of patient's response to treatment, examination of patient, obtaining history from patient or surrogate, re-evaluation of patient's condition, pulse oximetry, ordering and review of radiographic studies, ordering and review of laboratory studies and ordering and performing treatments and interventions   (including critical care time)  Medications Ordered in ED Medications  heparin bolus via infusion 4,000 Units (has no administration in time range)  heparin ADULT infusion 100 units/mL (25000 units/277mL sodium chloride 0.45%) (has no administration in time range)  ceFEPIme (MAXIPIME) 1 g in sodium chloride 0.9 % 100 mL IVPB (has no administration in time range)  vancomycin (VANCOCIN) 2,000 mg in sodium chloride 0.9 % 500 mL IVPB (has no administration in time range)  vancomycin variable dose per unstable renal function (pharmacist dosing) (has no administration in time range)  sodium chloride flush (NS) 0.9 % injection 3 mL (3 mLs Intravenous Given 07/27/2018 1433)  aspirin chewable tablet 324 mg (324 mg Oral Given 07/08/2018 1533)     Initial Impression / Assessment and Plan / ED Course  I have reviewed the triage vital signs and the nursing notes.  Pertinent labs & imaging results that were available during my care of the patient were reviewed by me and considered  in my medical decision making (see chart for details).     63 year old male with  history hypertension, hyperlipidemia, smoking presents with concern for intermittent chest pain and shortness of breath.  EKG shows elevation in the inferior and lateral leads, intraventricular conduction delay, with no previous EKGs for comparison.  Patient with shortness of breath today, without chest pain, and code STEMI not activated.  Troponin elevated to level of 28.  Called cardiology emergently regarding troponin elevation and presentation.  Discussed with Dr. Stanford Breed.  Suspect given patient's history of chest pressure on Monday, with no active chest pain today, and dyspnea, that he likely had an MI earlier this week, and is now having symptoms of congestive heart failure.  Labs returned showing acute kidney injury with a creatinine of 3.9, potassium of 5, BUN of 188.  Patient also with anemia with a hemoglobin of 9.  No recent prior lab work available.  He denies any history of black or bloody stools, or any other history of bleeding.  On rectal exam, he has dark stool, but it is not classic melena, and given overall presentation most consistent with cardiac ischemia, will continue with plan to give heparin for ACS.  Hemoccult ordered however do not feel this will affect plan at this time and feel continued close monitoring of hgb is appropriate.   CXR shows band like opacity-given cough, will cover for pneumonia but feel pneumonia is less likely and patient most likely with CHF as etiology of cough.   Suspect likely MI earlier this week, now with hypoperfusion from acute systolic heart failure leading to acute renal failure.  Labs pending looking at hepatic function, anemia panel, BNP, urine studies, ck.    Dr. Jonnie Finner of nephrology consulted and seeing patient. Dr. Stanford Breed of Cardiology to admit to ICU with CC Dr. Halford Chessman consulting.   Given aspirin, heparin.  Blood pressures stable. Pt sleepy however with  normal mentation.    Final Clinical Impressions(s) / ED Diagnoses   Final diagnoses:  ACS (acute coronary syndrome) ([place])  Acute renal failure, unspecified acute renal failure type (HCC)  Anemia, unspecified type    ED Discharge Orders    None       Gareth Morgan, MD 07/11/2018 1715

## 2018-07-28 NOTE — ED Notes (Signed)
Echo at the bedside °

## 2018-07-28 NOTE — Consult Note (Addendum)
Renal Service Consult Note [facility]  Travas Schexnayder 07/31/2018 Sol [place] Requesting Physician:  Dr. Billy Fischer, E.   Reason for Consult:  Renal failure HPI: The patient is a 63 y.o. year-old w/ history of HTN, HL came to ED for SOB x 1 week w/ some intermittent chest pain. No cardiac history. Had prolonged CP episode 1 wk ago w/ N/V. Now worsening DOE, nonprod cough but no edema or orthopnea. CXR here no CHF, small infiltrate RUL above the fissure. Trop is 26 and creat 3.9, BUN 180.   Admitted for CP, poss PNA and renal failure, started on IV heparin and IV abx. Asked to see for renal failure.    No prior hosp admission here.  Pt is awake but poor historian.  Was vomiting earlier this week.  No sob or cough.  He has 3 children, brother and cousin locally. Hx HTN , HL. Single and lives w/ his girlfriend.  +etoh, cigars, thc.   ROS  denies CP  no joint pain   no HA  no blurry vision  no rash    no dysuria  no difficulty voiding  no change in urine color    Past Medical History  Past Medical History:  Diagnosis Date  . Allergy   . Hx of adenomatous colonic polyps 03/07/2017  . Hyperlipidemia   . Hypertension    Past Surgical History  Past Surgical History:  Procedure Laterality Date  . COLONOSCOPY    . POLYPECTOMY    . TONSILLECTOMY     age 68   Family History  Family History  Problem Relation Age of Onset  . Colon polyps Brother   . Stroke Brother   . COPD Father   . COPD Sister   . Colon cancer Neg Hx   . Esophageal cancer Neg Hx   . Rectal cancer Neg Hx   . Stomach cancer Neg Hx   . Diabetes Neg Hx   . Colitis Neg Hx    Social History  reports that he has been smoking cigars. He has never used smokeless tobacco. He reports current alcohol use of about 2.0 standard drinks of alcohol per week. He reports current drug use. Drug: Marijuana. Allergies  Allergies  Allergen Reactions  . Penicillins Hives    Did it involve swelling of the  face/tongue/throat, SOB, or low BP? YES Did it involve sudden or severe rash/hives, skin peeling, or any reaction on the inside of your mouth or nose? NO Did you need to seek medical attention at a hospital or doctor's office? YES When did it last happen? 30 years ago If all above answers are "NO", may proceed with cephalosporin use.  . Simvastatin Other (See Comments)    insomnia   Home medications Prior to Admission medications   Medication Sig Start Date End Date Taking? Authorizing Provider  amLODipine (NORVASC) 10 MG tablet Take 10 mg by mouth daily.   Yes [provider]  atorvastatin (LIPITOR) 40 MG tablet Take 40 mg by mouth daily.    [provider]  Cetirizine HCl 10 MG CAPS Take 1 capsule by mouth as needed.    [provider]  hydrochlorothiazide (HYDRODIURIL) 25 MG tablet Take 25 mg by mouth daily.    [provider]  losartan (COZAAR) 50 MG tablet Take 50 mg by mouth daily.    [provider]  omega-3 fish oil (MAXEPA) 1000 MG CAPS capsule Take 2 capsules by mouth daily.    [provider]   Liver Function Tests No results for input(s): AST, ALT, ALKPHOS, BILITOT, PROT, ALBUMIN in the last 168 hours. No results for input(s): LIPASE, AMYLASE in the last 168 hours. CBC Recent Labs  Lab 08/03/2018 1431  WBC 9.6  HGB 9.3*  HCT 30.2*  MCV 68.3*  PLT PLATELET CLUMPS NOTED ON SMEAR, COUNT APPEARS ADEQUATE   Basic Metabolic Panel Recent Labs  Lab 07/06/2018 1431  NA 128*  K 5.0  CL 89*  CO2 21*  GLUCOSE 154*  BUN 198*  CREATININE 3.91*  CALCIUM 7.8*   Iron/TIBC/Ferritin/ %Sat No results found for: IRON, TIBC, FERRITIN, IRONPCTSAT  Vitals:   08/03/2018 1530 07/14/2018 1545 07/26/2018 1600 07/22/2018 1618  BP: (!) 116/99 (!) 117/95 110/83   Pulse:      Resp: (!) 27 18 20    Temp:      TempSrc:      SpO2:      Weight:    89.8 kg   Exam Gen awake, a bit foggy, no odor, follows all commands, Ox 3, appears  ill No rash, cyanosis or gangrene Sclera anicteric, throat clear  No jvd or bruits Chest clear bilat RRR no MRG Abd soft ntnd no mass or ascites +bs no bruits GU normal male MS no joint effusions or deformity Ext no LE or UE edema, no wounds or ulcers Neuro is a bit off, Ox 3, no asterixis, +mild tremors    Home meds:  - amlodipine 10/ losartan 50 qd/ hydrochlorothiazide 25 qd  - atorvastatin 40 qd  - prn's   Na 128  K 5.0  CO2 21  BUN 198  Cr 3.91  Ca 7.8    Trop 26.71  WBC 9k  Hb 9.3  Plt's clumping  CXR area of infiltrate above the fissure R lung, no edema / CHF   Assessment: 1. Renal failure - severe azotemia in setting of recent CP episode/ ^^trop and low EF on echo.  Suspect subacute / acute renal failure. Get renal US, UA and urine lytes to start with. Foley cath.  Pt looks uremic and prob has cardiogenic shock. Will need CRRT.  ECHO showed very low EF, 10% estimate per cards. Have d/w cardiology. CCM evaluating as well.  2. CM EF very low - final result pending 3. ^^trop 4. HTN - hold BP meds 5. Volume - no gross excess, CXR relatively clear  6. Anemia Hb 9.3, follow 7. ^LFT's ?shock liver    P: 1. As above.        Kelly Splinter MD [employer] pager [phone]   07/21/2018, 5:06 PM

## 2018-07-28 NOTE — ED Triage Notes (Signed)
Pt presents for evaluation of SOB x 1 week with some intermittent CP.

## 2018-07-28 NOTE — Consult Note (Signed)
NAME:  Samuel Bates, MRN:  [id number], DOB:  05-25-1956, LOS: 0 ADMISSION DATE:  07/13/2018, CONSULTATION DATE:  1/25 REFERRING MD: Gareth Morgan  , CHIEF COMPLAINT:  Shortness of breath x 5 days   Brief History   63 year old male with history of hypertension, hyperlipidemia and polyps who presents 1/25 with shortness of breath on exertion and non-productive cough x 5 days. No fever or chills.He did have chest pain x 1 week ago, which preceded his current symptoms. Because his dyspnea worsened, he presented for further evaluation to the ED.   History of present illness   Samuel Bates is a 63 y.o. male with a hx of hypertension, hyperlipidemia and polyps who presents 1/25 with shortness of breath on exertion and non-productive cough. EKG showed inferolateral ST elevation but there are inferior lateral Q waves as well. His initial troponin is 28 with follow-up being 26.7  , but no current chest pain. Pt. Did have CP x 1 week ago, substernal and radiated to his left upper extremity.  There was associated nausea and vomiting but he denies diaphoresis or dyspnea at that time.  He had recurrent pain by his report on Sunday and last chest pain was on Monday.  Over the past 5 days he has had worsening dyspnea on exertion but denies orthopnea or PND.Per his wife he has become progressively more confused, which is out of character for him. Marland Kitchen He was seen by Dr. Stanford Breed . He felt this was a late presentation infarct .  Given that he is not having active chest pain and has severe acute renal insufficiency he  did not feel  He  needed to proceed with cardiac catheterization emergently. Plan is for statin, and ASA. No BB at present. Echo is pending. Patient  is currently on 3 L [place] with sats of 96%. He states he has developed a cough and orthopnea over the past 5 days.He denies any fever or chills. He states he is a current every day smoker of cigars. He smokes approximately 2 cigars a day.He has smoked cigars for  56 years. Lactate, CK, LFT's, DIC panel, are pending. PCCM have been asked to consult in this patient suspected low perfusion with late presentation inferolateral myocardial infarction     Past Medical History   Past Medical History:  Diagnosis Date  . Allergy   . Hx of adenomatous colonic polyps 03/07/2017  . Hyperlipidemia   . Hypertension     Significant Hospital Events   1/25 Admission for dyspnea on exertion, AMS  Consults:  PCCM Cards Renal  Procedures:    Significant Diagnostic Tests:  1/25 CXR Interval band like area of patchy opacity in the right mid lung zone. This could represent pneumonia, patchy atelectasis or small area of pulmonary infarction. Stable cardiomegaly and mild chronic interstitial lung disease. 1/25 Echo EF is 10-15%, mild concentric hypertrophy, Systolic dysfunction severely reduced,Diffuse hypokinesis, restrictive physiology, indicative of decreased left ventricular diastolic compliance and/or increased left atrial pressure. Doppler parameters are consistent with high ventricular filling pressure. Regional wall motion abnormality: Akinesis of the mid-apical anterior, apical inferior, and apical myocardium; severe hypokinesis of the basal-mid inferior myocardium. MR>> moderate regurgitation, LA Moderately dilated, RV mildly dilated, RA severely dilated, TV moderate regurgitation, PAP 45 mm Hg, RAP 15 mm Hg.  1/25 Renal US    Micro Data:    Antimicrobials:  Vanc 1/26>> Cefepime 1/25>>  Interim history/subjective:  Pt currently on 3 L [place], soft BP  Objective  Blood pressure 110/83, pulse 88, temperature 97.7 F (36.5 C), temperature source Rectal, resp. rate 20, weight 89.8 kg, SpO2 95 %.       No intake or output data in the 24 hours ending 07/05/2018 1644 Filed Weights   07/06/2018 1618  Weight: 89.8 kg    Examination: General: Elderly male appears acutely ill , on 3 L [place], in NAD HENT: NCAT, No JVD, NO LAD Lungs: Bilateral chest  excursion, clear throughout, diminished per bases Cardiovascular: S1. S2, RRR No RG, + systolic murmur Abdomen: ND, NT, BS + Extremities: Warm and dry, no obvious deformities Neuro: Awake and alert x 2-3, Following commands, MAE x 4   Resolved Hospital Problem list     Assessment & Plan:  Recent inferior lateral myocardial infarction ( 1 week ago) EF is 10-15% per echo No current chest pain Soft BP BNP 4188 Plan: Per Cards  EKG prn Trend troponins Consider milrinone Trend Lactate Consider central line placement Statin ASA Hold heparin for now   Infiltrate on CXR Afebrile Plan Titrate oxygen to maintain sats > 94% Start vanc and cefepime as ordered Trend CXR Trend WBC and Fever Curve Sputum Culture if able Follow micro  Acute Kidney injury due to hypoperfusion state Creatinine 3.91 BUN 198 Plan Renal Consult>> HD most likely Renal US Consider HD cath Trend BMET and UO Avoid nephro toxic medications Maintain renal perfusion MAP > 65 mmHg  Anemia Heme + stools Plan Trend CBC Monitor for bleeding Type and screen Transfuse for HGB < 7   Abnormal LFT's 2/2 low perfusion state ALT / AST elevated  Plan Trend LFT's Check Ammonia Level   Nutrition NPO for now  Best practice:  Diet: NPO Pain/Anxiety/Delirium protocol (if indicated): NA VAP protocol (if indicated): NA DVT prophylaxis: PAS Hose GI prophylaxis: Protonix Glucose control: CBG Mobility:  BR Code Status: Full Family Communication: Pt updated Disposition:ICU   Labs   CBC: Recent Labs  Lab 07/13/2018 1431  WBC 9.6  HGB 9.3*  HCT 30.2*  MCV 68.3*  PLT PLATELET CLUMPS NOTED ON SMEAR, COUNT APPEARS ADEQUATE    Basic Metabolic Panel: Recent Labs  Lab 07/14/2018 1431  NA 128*  K 5.0  CL 89*  CO2 21*  GLUCOSE 154*  BUN 198*  CREATININE 3.91*  CALCIUM 7.8*   GFR: Estimated Creatinine Clearance: 22.1 mL/min (A) (by C-G formula based on SCr of 3.91 mg/dL (H)). Recent Labs  Lab  07/05/2018 1431  WBC 9.6    Liver Function Tests: No results for input(s): AST, ALT, ALKPHOS, BILITOT, PROT, ALBUMIN in the last 168 hours. No results for input(s): LIPASE, AMYLASE in the last 168 hours. No results for input(s): AMMONIA in the last 168 hours.  ABG No results found for: PHART, PCO2ART, PO2ART, HCO3, TCO2, ACIDBASEDEF, O2SAT   Coagulation Profile: No results for input(s): INR, PROTIME in the last 168 hours.  Cardiac Enzymes: No results for input(s): CKTOTAL, CKMB, CKMBINDEX, TROPONINI in the last 168 hours.  HbA1C: No results found for: HGBA1C  CBG: Recent Labs  Lab 07/21/2018 1532  GLUCAP 148*    Review of Systems:   Gen: Denies fever, chills, weight change, + fatigue, night sweats HEENT: Denies blurred vision, double vision, hearing loss, tinnitus, sinus congestion, rhinorrhea, sore throat, neck stiffness, dysphagia PULM: + shortness of breath, + cough, + sputum production, hemoptysis, wheezing CV: Denies chest pain, edema, + orthopnea, paroxysmal nocturnal dyspnea, palpitations GI: Denies abdominal pain, nausea, vomiting, diarrhea, hematochezia, + melena, constipation, change in bowel  habits GU: Denies dysuria, hematuria, polyuria, oliguria, urethral discharge Endocrine: Denies hot or cold intolerance, polyuria, polyphagia or appetite change Derm: Denies rash, dry skin, scaling or peeling skin change Heme: Denies easy bruising, bleeding, bleeding gums Neuro: Denies headache, numbness, weakness, slurred speech, loss of memory or consciousness  Past Medical History  He,  has a past medical history of Allergy, adenomatous colonic polyps (03/07/2017), Hyperlipidemia, and Hypertension.   Surgical History    Past Surgical History:  Procedure Laterality Date  . COLONOSCOPY    . POLYPECTOMY    . TONSILLECTOMY     age 62     Social History   reports that he has been smoking cigars. He has never used smokeless tobacco. He reports current alcohol use of about  2.0 standard drinks of alcohol per week. He reports current drug use. Drug: Marijuana.   Family History   His family history includes COPD in his father and sister; Colon polyps in his brother; Stroke in his brother. There is no history of Colon cancer, Esophageal cancer, Rectal cancer, Stomach cancer, Diabetes, or Colitis.   Allergies Allergies  Allergen Reactions  . Penicillins Hives    Did it involve swelling of the face/tongue/throat, SOB, or low BP? YES Did it involve sudden or severe rash/hives, skin peeling, or any reaction on the inside of your mouth or nose? NO Did you need to seek medical attention at a hospital or doctor's office? YES When did it last happen? 30 years ago If all above answers are "NO", may proceed with cephalosporin use.  . Simvastatin Other (See Comments)    insomnia     Home Medications  Prior to Admission medications   Medication Sig Start Date End Date Taking? Authorizing Provider  amLODipine (NORVASC) 10 MG tablet Take 10 mg by mouth daily.   Yes [provider]  atorvastatin (LIPITOR) 40 MG tablet Take 40 mg by mouth daily.    [provider]  Cetirizine HCl 10 MG CAPS Take 1 capsule by mouth as needed.    [provider]  hydrochlorothiazide (HYDRODIURIL) 25 MG tablet Take 25 mg by mouth daily.    [provider]  losartan (COZAAR) 50 MG tablet Take 50 mg by mouth daily.    [provider]  omega-3 fish oil (MAXEPA) 1000 MG CAPS capsule Take 2 capsules by mouth daily.    [provider]     APP Critical care time: 36 minutes      Magdalen Spatz, AGACNP-BC [place] Pager # [phone] After 3 pm [phone] 07/06/2018 4:45 PM

## 2018-07-28 NOTE — Procedures (Signed)
Central Venous Catheter Insertion Procedure Note Samuel Bates [id number] [birth year]  Procedure: Insertion of Central Venous Catheter Indications: Acute renal failure  Procedure Details Consent: Risks of procedure as well as the alternatives and risks of each were explained to the (patient/caregiver).  Consent for procedure obtained. Time Out: Verified patient identification, verified procedure, site/side was marked, verified correct patient position, special equipment/implants available, medications/allergies/relevent history reviewed, required imaging and test results available.  Performed  Maximum sterile technique was used including antiseptics, cap, gloves, gown, hand hygiene, mask and sheet. Skin prep: Chlorhexidine; local anesthetic administered A antimicrobial bonded/coated triple lumen dialysis catheter was placed in the right femoral vein due to multiple attempts were done on both IJ's with a very easy blood return but guidewire could not be threaded and both sides despite easy first time sticks and trial of higher and lower sticks.  Evaluation Blood flow good Complications: No apparent complications Patient did tolerate procedure well.   Samuel Bates 07/22/2018, 9:06 PM

## 2018-07-28 NOTE — ED Notes (Signed)
Patient transported to X-ray 

## 2018-07-28 NOTE — Consult Note (Addendum)
Cardiology Consultation:   Patient ID: Samuel Bates MRN: [id number]; DOB: February 07, 1956  Admit date: 07/05/2018 Date of Consult: 07/27/2018  Primary Care Provider: Saintclair Halsted, FNP Primary Cardiologist: New; Dr Samuel Bates    Patient Profile:   Samuel Bates is a 63 y.o. male with a hx of hypertension, hyperlipidemia and polyps who is being seen today for the evaluation of acute inferolateral myocardial infarction at the request of Samuel Morgan MD.  History of Present Illness:   No prior cardiac history.  Patient somewhat vague today.  However he states he had chest pain 1 week ago for 1 hour that was substernal and radiated to his left upper extremity.  There was associated nausea and vomiting but he denies diaphoresis or dyspnea at that time.  He had recurrent pain by his report on Sunday and last chest pain was on Monday.  Over the past 5 days he has had worsening dyspnea on exertion but denies orthopnea or PND.  He denies pedal edema.  He has not had fevers or chills but has had a cough.  It is nonproductive.  Because of his symptoms he presented for further evaluation.  Note he also denies melena or hematochezia.  Past Medical History:  Diagnosis Date  . Allergy   . Hx of adenomatous colonic polyps 03/07/2017  . Hyperlipidemia   . Hypertension     Past Surgical History:  Procedure Laterality Date  . COLONOSCOPY    . POLYPECTOMY    . TONSILLECTOMY     age 80     Inpatient Medications: Scheduled Meds:  Continuous Infusions:  PRN Meds:   Allergies:    Allergies  Allergen Reactions  . Penicillins Hives    Did it involve swelling of the face/tongue/throat, SOB, or low BP? YES Did it involve sudden or severe rash/hives, skin peeling, or any reaction on the inside of your mouth or nose? NO Did you need to seek medical attention at a hospital or doctor's office? YES When did it last happen? 30 years ago If all above answers are "NO", may proceed with  cephalosporin use.  . Simvastatin Other (See Comments)    insomnia    Social History:   Social History   Socioeconomic History  . Marital status: Single    Spouse name: Not on file  . Number of children: 3  . Years of education: Not on file  . Highest education level: Not on file  Occupational History  . Not on file  Social Needs  . Financial resource strain: Not on file  . Food insecurity:    Worry: Not on file    Inability: Not on file  . Transportation needs:    Medical: Not on file    Non-medical: Not on file  Tobacco Use  . Smoking status: Current Every Day Smoker    Types: Cigars  . Smokeless tobacco: Never Used  . Tobacco comment: has cut back not daily  Substance and Sexual Activity  . Alcohol use: Yes    Alcohol/week: 2.0 standard drinks    Types: 2 Cans of beer per week    Comment: 2-3 beers per day  . Drug use: Yes    Types: Marijuana    Comment: had some Saturday 04/07/2018  . Sexual activity: Not on file  Lifestyle  . Physical activity:    Days per week: Not on file    Minutes per session: Not on file  . Stress: Not on file  Relationships  . Social connections:  Talks on phone: Not on file    Gets together: Not on file    Attends religious service: Not on file    Active member of club or organization: Not on file    Attends meetings of clubs or organizations: Not on file    Relationship status: Not on file  . Intimate partner violence:    Fear of current or ex partner: Not on file    Emotionally abused: Not on file    Physically abused: Not on file    Forced sexual activity: Not on file  Other Topics Concern  . Not on file  Social History Narrative  . Not on file    Family History:    Family History  Problem Relation Age of Onset  . Colon polyps Brother   . Stroke Brother   . COPD Father   . COPD Sister   . Colon cancer Neg Hx   . Esophageal cancer Neg Hx   . Rectal cancer Neg Hx   . Stomach cancer Neg Hx   . Diabetes Neg Hx   .  Colitis Neg Hx      ROS:  Please see the history of present illness.  Patient denies fevers, chills, hemoptysis, melena, hematochezia, hematemesis.  He has had dyspnea on exertion and chest pain as outlined in HPI. All other ROS reviewed and negative.     Physical Exam/Data:   Vitals:   07/19/2018 1500 07/09/2018 1515 07/11/2018 1530 07/27/2018 1545  BP: (!) 116/94 107/88 (!) 116/99 (!) 117/95  Pulse: 88     Resp: (!) 23  (!) 27 18  Temp:      TempSrc:      SpO2:       No intake or output data in the 24 hours ending 07/18/2018 1608 Last 3 Weights 04/09/2018 03/30/2018 02/28/2017  Weight (lbs) 203 lb 203 lb 195 lb  Weight (kg) 92.08 kg 92.08 kg 88.451 kg     There is no height or weight on file to calculate BMI.  General:  Well nourished, well developed HEENT: normal Lymph: no adenopathy Neck: positive JVD Endocrine:  No thryomegaly Vascular: No carotid bruits; FA pulses 2+ bilaterally without bruits  Cardiac:  normal S1, S2; RRR; 2/6 systolic murmur apex Lungs:  clear to auscultation bilaterally, no wheezing, rhonchi or rales  Abd: soft, nontender, no hepatomegaly  Ext: no edema Musculoskeletal:  No deformities; 2+ radial and femoral pulses.  2+ right dorsalis pedis and 1+ left. Skin: warm and dry  Neuro:  CNs 2-12 intact, no focal abnormalities noted; somnelent Psych:  Normal affect   EKG:  The EKG was personally reviewed and demonstrates: Sinus rhythm, right axis deviation, inferior lateral ST elevation with inferior lateral Q waves.   Laboratory Data:  Chemistry Recent Labs  Lab 08/03/2018 1431  NA 128*  K 5.0  CL 89*  CO2 21*  GLUCOSE 154*  BUN 198*  CREATININE 3.91*  CALCIUM 7.8*  GFRNONAA 15*  GFRAA 18*  ANIONGAP 18*    Hematology Recent Labs  Lab 07/05/2018 1431  WBC 9.6  RBC 4.42  HGB 9.3*  HCT 30.2*  MCV 68.3*  MCH 21.0*  MCHC 30.8  RDW 21.4*  PLT PLATELET CLUMPS NOTED ON SMEAR, COUNT APPEARS ADEQUATE    Recent Labs  Lab 08/02/2018 1447  07/19/2018 1536  TROPIPOC 28.64* 26.71*    Radiology/Studies:  Dg Chest 2 View  Result Date: 07/18/2018 CLINICAL DATA:  Shortness of breath for the past week with intermittent  chest pain. EXAM: CHEST - 2 VIEW COMPARISON:  05/10/2006. FINDINGS: Stable enlarged cardiac silhouette. Interval band like area of patchy opacity in the right mid lung zone. Clear left lung. Stable mild diffuse prominence of the interstitial markings with normal vascularity. Decreased peribronchial thickening. Mild thoracolumbar spine degenerative changes. IMPRESSION: 1. Interval band like area of patchy opacity in the right mid lung zone. This could represent pneumonia, patchy atelectasis or small area of pulmonary infarction. 2. Stable cardiomegaly and mild chronic interstitial lung disease. Electronically Signed   By: Claudie Revering M.D.   On: 07/21/2018 14:36    Assessment and Plan:   1. Recent inferior lateral myocardial infarction-patient presents with complaints of chest pain intermittently last 5 days ago.  He then developed severe dyspnea on exertion.  His electrocardiogram shows inferolateral ST elevation but there are inferior lateral Q waves as well. His initial troponin is 28 with follow-up being 26.7.  I think this is a late presentation infarct and given that he is not having active chest pain and has severe acute renal insufficiency I do not think we need to proceed with cardiac catheterization emergently.  Will treat with aspirin, heparin and statin.  His blood pressure is borderline and he will likely require dialysis for uremia.  I will therefore not add a beta-blocker.  Check echocardiogram for LV function.  Prognosis is guarded as he is critically ill.  May need milrinone.  He will ultimately require cardiac catheterization as his renal function improves. 2. Acute renal failure-question secondary to hypoperfusion of kidneys with recent infarct.  Patient will need nephrology consultation and probable dialysis.  He  is somewhat somnolent and I wonder if this is secondary to uremia. 3. Microcytic anemia-patient denies melena, hematochezia or hematemesis.  He will need further work-up as he improves. 4. History of tobacco use-patient counseled on discontinuing.  Addendum-echocardiogram shows ejection fraction 10 to 15%, moderate mitral regurgitation, moderate left atrial enlargement, mild right ventricular enlargement with severely reduced function and severe right atrial enlargement.  There was moderate tricuspid regurgitation.  I have discussed the patient with Dr. Tamala Julian and Dr. Haroldine Laws.  He is being admitted to CCU.  We do not believe there is an indication to take patient emergently to cardiac catheterization laboratory as we feel his presentation was late.  He is heme positive on rectal exam and has a significant microcytic anemia.  Question if portion of BUN elevation related to GI bleeding (vs poor perfusion of kidneys related to recent MI).  I will therefore not add IV heparin at this point but will cover with subcutaneous heparin. Will add protonix. If hemoglobin remains stable we can consider adding IV heparin later.  Nephrology is seeing the patient and we feel he will likely need dialysis Discussed with Dr Melvia Heaps).  We will also place central line and check CVP and Coox.  If significantly decreased will need milrinone.  If he becomes hypotensive with dialysis we will add norepinephrine.  Patient is critically ill and prognosis is guarded.  CRITICAL CARE Performed by: Kirk Ruths   Total critical care time: 60 minutes  Critical care time was exclusive of separately billable procedures and treating other patients.  Critical care was necessary to treat or prevent imminent or life-threatening deterioration.  Critical care was time spent personally by me on the following activities: development of treatment plan with patient and/or surrogate as well as nursing, discussions with consultants, evaluation  of patient's response to treatment, examination of patient, obtaining history from patient  or surrogate, ordering and performing treatments and interventions, ordering and review of laboratory studies, ordering and review of radiographic studies, pulse oximetry and re-evaluation of patient's condition.  For questions or updates, please contact [place] Please consult [email or web address] for contact info under     Signed, Kirk Ruths, MD  07/24/2018 4:08 PM

## 2018-07-29 ENCOUNTER — Inpatient Hospital Stay (HOSPITAL_COMMUNITY): Payer: Medicaid Other

## 2018-07-29 LAB — RENAL FUNCTION PANEL
Albumin: 3.4 g/dL — ABNORMAL LOW (ref 3.5–5.0)
Albumin: 3.2 g/dL — ABNORMAL LOW (ref 3.5–5.0)
Anion gap: 14 (ref 5–15)
Anion gap: 10 (ref 5–15)
BUN: 156 mg/dL — ABNORMAL HIGH (ref 8–23)
BUN: 99 mg/dL — ABNORMAL HIGH (ref 8–23)
CALCIUM: 7.6 mg/dL — AB (ref 8.9–10.3)
CO2: 23 mmol/L (ref 22–32)
CO2: 23 mmol/L (ref 22–32)
Calcium: 7.6 mg/dL — ABNORMAL LOW (ref 8.9–10.3)
Chloride: 96 mmol/L — ABNORMAL LOW (ref 98–111)
Chloride: 100 mmol/L (ref 98–111)
Creatinine, Ser: 2.75 mg/dL — ABNORMAL HIGH (ref 0.61–1.24)
Creatinine, Ser: 1.9 mg/dL — ABNORMAL HIGH (ref 0.61–1.24)
GFR calc Af Amer: 43 mL/min — ABNORMAL LOW (ref >60)
GFR calc non Af Amer: 24 mL/min — ABNORMAL LOW (ref >60)
GFR calc non Af Amer: 37 mL/min — ABNORMAL LOW (ref >60)
GFR, EST AFRICAN AMERICAN: 27 mL/min — AB (ref >60)
Glucose, Bld: 138 mg/dL — ABNORMAL HIGH (ref 70–99)
Glucose, Bld: 134 mg/dL — ABNORMAL HIGH (ref 70–99)
Phosphorus: 6.3 mg/dL — ABNORMAL HIGH (ref 2.5–4.6)
Phosphorus: 4.3 mg/dL (ref 2.5–4.6)
Potassium: 4.8 mmol/L (ref 3.5–5.1)
Potassium: 4.9 mmol/L (ref 3.5–5.1)
SODIUM: 133 mmol/L — AB (ref 135–145)
Sodium: 133 mmol/L — ABNORMAL LOW (ref 135–145)

## 2018-07-29 LAB — CBC
HCT: 27.9 % — ABNORMAL LOW (ref 39.0–52.0)
HCT: 27.1 % — ABNORMAL LOW (ref 39.0–52.0)
HCT: 29.5 % — ABNORMAL LOW (ref 39.0–52.0)
HEMOGLOBIN: 8.3 g/dL — AB (ref 13.0–17.0)
Hemoglobin: 8.6 g/dL — ABNORMAL LOW (ref 13.0–17.0)
Hemoglobin: 9 g/dL — ABNORMAL LOW (ref 13.0–17.0)
MCH: 21 pg — AB (ref 26.0–34.0)
MCH: 20.9 pg — ABNORMAL LOW (ref 26.0–34.0)
MCH: 21 pg — ABNORMAL LOW (ref 26.0–34.0)
MCHC: 30.8 g/dL (ref 30.0–36.0)
MCHC: 30.6 g/dL (ref 30.0–36.0)
MCHC: 30.5 g/dL (ref 30.0–36.0)
MCV: 68.2 fL — ABNORMAL LOW (ref 80.0–100.0)
MCV: 68.1 fL — ABNORMAL LOW (ref 80.0–100.0)
MCV: 68.9 fL — AB (ref 80.0–100.0)
Platelets: 191 10*3/uL (ref 150–400)
Platelets: 186 10*3/uL (ref 150–400)
Platelets: 182 10*3/uL (ref 150–400)
RBC: 4.09 MIL/uL — ABNORMAL LOW (ref 4.22–5.81)
RBC: 3.98 MIL/uL — AB (ref 4.22–5.81)
RBC: 4.28 MIL/uL (ref 4.22–5.81)
RDW: 21.1 % — ABNORMAL HIGH (ref 11.5–15.5)
RDW: 21 % — ABNORMAL HIGH (ref 11.5–15.5)
RDW: 22.2 % — ABNORMAL HIGH (ref 11.5–15.5)
WBC: 10.2 10*3/uL (ref 4.0–10.5)
WBC: 8.9 10*3/uL (ref 4.0–10.5)
WBC: 9.4 10*3/uL (ref 4.0–10.5)
nRBC: 36.1 % — ABNORMAL HIGH (ref 0.0–0.2)
nRBC: 37.3 % — ABNORMAL HIGH (ref 0.0–0.2)
nRBC: 38.3 % — ABNORMAL HIGH (ref 0.0–0.2)

## 2018-07-29 LAB — COOXEMETRY PANEL
CARBOXYHEMOGLOBIN: 0.9 % (ref 0.5–1.5)
Carboxyhemoglobin: 1.5 % (ref 0.5–1.5)
Carboxyhemoglobin: 1.7 % — ABNORMAL HIGH (ref 0.5–1.5)
Carboxyhemoglobin: 1.5 % (ref 0.5–1.5)
Carboxyhemoglobin: 1.4 % (ref 0.5–1.5)
Methemoglobin: 1.7 % — ABNORMAL HIGH (ref 0.0–1.5)
Methemoglobin: 1.8 % — ABNORMAL HIGH (ref 0.0–1.5)
Methemoglobin: 0.9 % (ref 0.0–1.5)
Methemoglobin: 1.3 % (ref 0.0–1.5)
Methemoglobin: 2.1 % — ABNORMAL HIGH (ref 0.0–1.5)
O2 SAT: 18.8 %
O2 SAT: 54.4 %
O2 Saturation: 55.4 %
O2 Saturation: 58.5 %
O2 Saturation: 53.2 %
Total hemoglobin: 9.3 g/dL — ABNORMAL LOW (ref 12.0–16.0)
Total hemoglobin: 8.7 g/dL — ABNORMAL LOW (ref 12.0–16.0)
Total hemoglobin: 8.7 g/dL — ABNORMAL LOW (ref 12.0–16.0)
Total hemoglobin: 9.8 g/dL — ABNORMAL LOW (ref 12.0–16.0)
Total hemoglobin: 10.1 g/dL — ABNORMAL LOW (ref 12.0–16.0)

## 2018-07-29 LAB — COMPREHENSIVE METABOLIC PANEL
ALK PHOS: 78 U/L (ref 38–126)
ALT: 945 U/L — AB (ref 0–44)
AST: 756 U/L — AB (ref 15–41)
Albumin: 3.4 g/dL — ABNORMAL LOW (ref 3.5–5.0)
Anion gap: 12 (ref 5–15)
BUN: 121 mg/dL — AB (ref 8–23)
CO2: 24 mmol/L (ref 22–32)
Calcium: 7.6 mg/dL — ABNORMAL LOW (ref 8.9–10.3)
Chloride: 98 mmol/L (ref 98–111)
Creatinine, Ser: 2.19 mg/dL — ABNORMAL HIGH (ref 0.61–1.24)
GFR calc Af Amer: 36 mL/min — ABNORMAL LOW (ref >60)
GFR calc non Af Amer: 31 mL/min — ABNORMAL LOW (ref >60)
GLUCOSE: 126 mg/dL — AB (ref 70–99)
Potassium: 4.6 mmol/L (ref 3.5–5.1)
Sodium: 134 mmol/L — ABNORMAL LOW (ref 135–145)
Total Bilirubin: 3.4 mg/dL — ABNORMAL HIGH (ref 0.3–1.2)
Total Protein: 6.4 g/dL — ABNORMAL LOW (ref 6.5–8.1)

## 2018-07-29 LAB — HEPARIN LEVEL (UNFRACTIONATED)
Heparin Unfractionated: 0.1 IU/mL — ABNORMAL LOW (ref 0.30–0.70)
Heparin Unfractionated: 0.28 IU/mL — ABNORMAL LOW (ref 0.30–0.70)

## 2018-07-29 LAB — BASIC METABOLIC PANEL
Anion gap: 14 (ref 5–15)
BUN: 88 mg/dL — AB (ref 8–23)
CO2: 22 mmol/L (ref 22–32)
Calcium: 7.6 mg/dL — ABNORMAL LOW (ref 8.9–10.3)
Chloride: 95 mmol/L — ABNORMAL LOW (ref 98–111)
Creatinine, Ser: 1.8 mg/dL — ABNORMAL HIGH (ref 0.61–1.24)
GFR calc Af Amer: 46 mL/min — ABNORMAL LOW (ref >60)
GFR calc non Af Amer: 39 mL/min — ABNORMAL LOW (ref >60)
Glucose, Bld: 128 mg/dL — ABNORMAL HIGH (ref 70–99)
Potassium: 4.7 mmol/L (ref 3.5–5.1)
Sodium: 131 mmol/L — ABNORMAL LOW (ref 135–145)

## 2018-07-29 LAB — HIV ANTIBODY (ROUTINE TESTING W REFLEX): HIV Screen 4th Generation wRfx: NONREACTIVE

## 2018-07-29 LAB — MAGNESIUM
MAGNESIUM: 3.4 mg/dL — AB (ref 1.7–2.4)
Magnesium: 2.9 mg/dL — ABNORMAL HIGH (ref 1.7–2.4)

## 2018-07-29 LAB — LACTIC ACID, PLASMA: Lactic Acid, Venous: 2.1 mmol/L (ref 0.5–1.9)

## 2018-07-29 LAB — APTT: aPTT: 55 seconds — ABNORMAL HIGH (ref 24–36)

## 2018-07-29 LAB — LIPID PANEL
Cholesterol: 136 mg/dL (ref 0–200)
HDL: 17 mg/dL — ABNORMAL LOW (ref >40)
LDL Cholesterol: 100 mg/dL — ABNORMAL HIGH (ref 0–99)
Total CHOL/HDL Ratio: 8 RATIO
Triglycerides: 94 mg/dL (ref <150)
VLDL: 19 mg/dL (ref 0–40)

## 2018-07-29 LAB — ABO/RH: ABO/RH(D): B POS

## 2018-07-29 LAB — TROPONIN I
Troponin I: 28.85 ng/mL (ref <0.03)
Troponin I: 24.43 ng/mL (ref <0.03)

## 2018-07-29 LAB — PREPARE RBC (CROSSMATCH)

## 2018-07-29 MED ORDER — MIDAZOLAM HCL 2 MG/2ML IJ SOLN
1.0000 mg | Freq: Once | INTRAMUSCULAR | Status: AC
  Administered 2018-07-29: 1 mg via INTRAVENOUS

## 2018-07-29 MED ORDER — MIDAZOLAM HCL 2 MG/2ML IJ SOLN
INTRAMUSCULAR | Status: AC
  Administered 2018-07-29: 1 mg via INTRAVENOUS
  Filled 2018-07-29: qty 2

## 2018-07-29 MED ORDER — AMIODARONE IV BOLUS ONLY 150 MG/100ML: 150.0000 mg | Freq: Once | INTRAVENOUS | Status: DC

## 2018-07-29 MED ORDER — AMIODARONE LOAD VIA INFUSION
150.0000 mg | Freq: Once | INTRAVENOUS | Status: AC
  Administered 2018-07-29: 150 mg via INTRAVENOUS
  Filled 2018-07-29: qty 83.34

## 2018-07-29 MED ORDER — AMIODARONE LOAD VIA INFUSION
150.0000 mg | Freq: Once | INTRAVENOUS | Status: AC
  Administered 2018-07-29: 150 mg via INTRAVENOUS

## 2018-07-29 MED ORDER — FENTANYL CITRATE (PF) 100 MCG/2ML IJ SOLN
25.0000 ug | Freq: Once | INTRAMUSCULAR | Status: AC
  Administered 2018-07-29: 25 ug via INTRAVENOUS

## 2018-07-29 MED ORDER — AMIODARONE HCL IN DEXTROSE 360-4.14 MG/200ML-% IV SOLN
INTRAVENOUS | Status: AC
  Filled 2018-07-29: qty 200

## 2018-07-29 MED ORDER — AMIODARONE HCL IN DEXTROSE 360-4.14 MG/200ML-% IV SOLN
60.0000 mg/h | INTRAVENOUS | Status: DC
  Administered 2018-07-29 (×2): 60 mg/h via INTRAVENOUS
  Filled 2018-07-29: qty 200

## 2018-07-29 MED ORDER — SODIUM CHLORIDE 0.9% FLUSH
10.0000 mL | Freq: Two times a day (BID) | INTRAVENOUS | Status: DC
  Administered 2018-07-29 (×2): 10 mL
  Administered 2018-07-30: 20 mL
  Administered 2018-07-30 – 2018-07-31 (×2): 10 mL

## 2018-07-29 MED ORDER — MILRINONE LACTATE IN DEXTROSE 20-5 MG/100ML-% IV SOLN
0.2500 ug/kg/min | INTRAVENOUS | Status: DC
  Administered 2018-07-29: 0.25 ug/kg/min via INTRAVENOUS
  Filled 2018-07-29: qty 100

## 2018-07-29 MED ORDER — MILRINONE LACTATE IN DEXTROSE 20-5 MG/100ML-% IV SOLN: 0.2500 ug/kg/min | INTRAVENOUS | Status: DC

## 2018-07-29 MED ORDER — NOREPINEPHRINE 16 MG/250ML-% IV SOLN
0.0000 ug/min | INTRAVENOUS | Status: DC
  Administered 2018-07-30: 27 ug/min via INTRAVENOUS
  Administered 2018-07-30 – 2018-07-31 (×2): 12 ug/min via INTRAVENOUS
  Administered 2018-08-02: 13.973 ug/min via INTRAVENOUS
  Administered 2018-08-03 (×2): 22 ug/min via INTRAVENOUS
  Administered 2018-08-04: 25 ug/min via INTRAVENOUS
  Administered 2018-08-05: 20 ug/min via INTRAVENOUS
  Administered 2018-08-05: 28 ug/min via INTRAVENOUS
  Administered 2018-08-06: 30 ug/min via INTRAVENOUS
  Administered 2018-08-06: 40 ug/min via INTRAVENOUS
  Administered 2018-08-07 (×2): 30 ug/min via INTRAVENOUS
  Administered 2018-08-08: 18 ug/min via INTRAVENOUS
  Filled 2018-07-29 (×17): qty 250

## 2018-07-29 MED ORDER — FENTANYL CITRATE (PF) 100 MCG/2ML IJ SOLN
INTRAMUSCULAR | Status: AC
  Administered 2018-07-29: 25 ug via INTRAVENOUS
  Filled 2018-07-29: qty 2

## 2018-07-29 MED ORDER — AMIODARONE HCL IN DEXTROSE 360-4.14 MG/200ML-% IV SOLN
30.0000 mg/h | INTRAVENOUS | Status: DC
  Administered 2018-07-30 – 2018-07-31 (×5): 30 mg/h via INTRAVENOUS
  Administered 2018-08-01 – 2018-08-02 (×8): 60 mg/h via INTRAVENOUS
  Administered 2018-08-03 (×2): 30 mg/h via INTRAVENOUS
  Administered 2018-08-03: 60 mg/h via INTRAVENOUS
  Administered 2018-08-04 – 2018-08-09 (×11): 30 mg/h via INTRAVENOUS
  Filled 2018-07-29 (×27): qty 200

## 2018-07-29 MED ORDER — SODIUM CHLORIDE 0.9% IV SOLUTION
Freq: Once | INTRAVENOUS | Status: AC
  Administered 2018-07-29: 11:00:00 via INTRAVENOUS

## 2018-07-29 MED ORDER — SODIUM CHLORIDE 0.9% FLUSH: 10.0000 mL | INTRAVENOUS | Status: DC | PRN

## 2018-07-29 MED ORDER — HEPARIN BOLUS VIA INFUSION
2000.0000 [IU] | Freq: Once | INTRAVENOUS | Status: AC
  Administered 2018-07-29: 2000 [IU] via INTRAVENOUS
  Filled 2018-07-29: qty 2000

## 2018-07-29 MED ORDER — SODIUM CHLORIDE 0.9 % IV SOLN
510.0000 mg | INTRAVENOUS | Status: AC
  Administered 2018-07-29 – 2018-08-01 (×2): 510 mg via INTRAVENOUS
  Filled 2018-07-29 (×2): qty 17

## 2018-07-29 MED ORDER — CHLORHEXIDINE GLUCONATE CLOTH 2 % EX PADS
6.0000 | MEDICATED_PAD | Freq: Every day | CUTANEOUS | Status: DC
  Administered 2018-07-30 – 2018-08-20 (×19): 6 via TOPICAL

## 2018-07-29 MED ORDER — PRISMASOL BGK 0/2.5 32-2.5 MEQ/L IV SOLN
INTRAVENOUS | Status: DC
  Administered 2018-07-29 – 2018-08-01 (×23): via INTRAVENOUS_CENTRAL
  Filled 2018-07-29 (×32): qty 5000

## 2018-07-29 NOTE — Progress Notes (Addendum)
[facility] Progress Note  Subjective: EF 15 % by ECHO, high filling pressures ,+ regional wall motion changes. Tolerating CRRT, on milrinone and levo gtt's.  No c/o today. No SOB.   Vitals:   07/29/18 0600 07/29/18 0630 07/29/18 0700 07/29/18 0800  BP: 111/79 106/75 112/74 105/74  Pulse: 92  88 88  Resp: 11 12 12 11   Temp:    (!) 97.5 F (36.4 C)  TempSrc:    Oral  SpO2: 95%  96% 97%  Weight:      Height:        Inpatient medications: . sodium chloride   Intravenous Once  . aspirin EC  81 mg Oral Daily  . Chlorhexidine Gluconate Cloth  6 each Topical Daily  . norepinephrine      . pantoprazole (PROTONIX) IV  40 mg Intravenous BID  . sodium chloride flush  10-40 mL Intracatheter Q12H   .  prismasol BGK 4/2.5 400 mL/hr at 07/15/2018 2212  .  prismasol BGK 4/2.5 200 mL/hr at 07/25/2018 2057  . ceFEPime (MAXIPIME) IV Stopped (07/25/2018 2158)  . heparin 1,400 Units/hr (07/29/18 0900)  . milrinone 0.125 mcg/kg/min (07/29/18 0900)  . norepinephrine (LEVOPHED) Adult infusion 5 mcg/min (07/29/18 0900)  . prismasol BGK 4/2.5 1,800 mL/hr at 07/29/18 0850  . vancomycin     acetaminophen, alteplase, heparin, nitroGLYCERIN, ondansetron (ZOFRAN) IV, sodium chloride, sodium chloride flush  Iron/TIBC/Ferritin/ %Sat    Component Value Date/Time   IRON 14 (L) 07/17/2018 1651   TIBC 458 (H) 07/15/2018 1651   FERRITIN 28 07/07/2018 1651   IRONPCTSAT 3 (L) 07/24/2018 1651    Exam: Gen awake, more alert today, Ox 3 No rash, cyanosis or gangrene Sclera anicteric, throat clear  No jvd or bruits Chest clear bilat RRR no MRG Abd soft ntnd no mass or ascites +bs no bruits GU normal male MS no joint effusions or deformity Ext no LE or UE edema, no wounds or ulcers Neuro is a bit off, Ox 3 IJ temp HD cath    Home meds:  - amlodipine 10/ losartan 50 qd/ hydrochlorothiazide 25 qd  - atorvastatin 40 qd  - prn's   UA negative  UNa <10, UCr 151  Renal US > R kidney ^'d  echo, no hydro. L kidney not seen, positioning issue.   CXR area of infiltrate above the fissure R lung, no edema / CHF  ECHO EF 15%, regional WMA"s inf/ apical, ^'d filling pressures, severe RV    Assessment/ Plan: 1. Renal failure - prob AKI, no old labs. Severe azotemia w/ uremia on presentation, improving w/ CRRT.  Suspect hemodynamic/ cardiorenal. New ^^trop and severe LV dysfunction by ECHO. On milrinone/ levo gtt's. Making urine overnight 1000 cc, prob w/ inotrope assist. Cont CRRT for now. Needs repeat US or noncont CT when more stable to visualize the left kidney.  2. Cardiogenic shock - milrinone/ levo, per cards 3. ^troponin - prob recent MI 4. Hypotension- HTN meds on hold 5. Volume - no gross excess, CXR clear, high filling pressures; CXR today vasc congestion. Will start UF 50- 75 cc/hr.  6. Anemia Hb 9.3, follow 7. LFT's prob shock liver     Kelly Splinter MD [facility] pager [phone]   07/29/2018, 9:35 AM   Recent Labs  Lab 07/04/2018 1651 07/29/18 0416 07/29/18 0653  NA  --  133* 134*  K  --  4.8 4.6  CL  --  96* 98  CO2  --  23 24  GLUCOSE  --  138* 126*  BUN  --  156* 121*  CREATININE  --  2.75* 2.19*  CALCIUM  --  7.6* 7.6*  PHOS  --  6.3*  --   ALBUMIN 3.6 3.4* 3.4*  INR 1.81  1.83  --   --    Recent Labs  Lab 07/26/2018 1651 07/29/18 0653  AST 1,103* 756*  ALT 1,058* 945*  ALKPHOS 86 78  BILITOT 3.0* 3.4*  PROT 7.2 6.4*   Recent Labs  Lab 07/26/2018 1651 07/29/18 0150 07/29/18 0653  WBC  --  10.2 8.9  NEUTROABS 78.6*  --   --   HGB  --  8.6* 8.3*  HCT  --  27.9* 27.1*  MCV  --  68.2* 68.1*  PLT 194 191 186

## 2018-07-29 NOTE — Progress Notes (Signed)
Progress Note  Patient Name: Samuel Bates Date of Encounter: 07/29/2018  Primary Cardiologist: Dr Stanford Breed  Subjective   Pt denies CP or dyspnea  Inpatient Medications    Scheduled Meds: . aspirin EC  81 mg Oral Daily  . atorvastatin  40 mg Oral q1800  . Chlorhexidine Gluconate Cloth  6 each Topical Daily  . norepinephrine      . pantoprazole (PROTONIX) IV  40 mg Intravenous BID  . sodium chloride flush  10-40 mL Intracatheter Q12H   Continuous Infusions: .  prismasol BGK 4/2.5 400 mL/hr at 07/07/2018 2212  .  prismasol BGK 4/2.5 200 mL/hr at 07/06/2018 2057  . ceFEPime (MAXIPIME) IV Stopped (07/06/2018 2158)  . heparin 1,400 Units/hr (07/29/18 0700)  . milrinone 0.25 mcg/kg/min (07/29/18 0700)  . norepinephrine (LEVOPHED) Adult infusion 5 mcg/min (07/29/18 0700)  . prismasol BGK 4/2.5 1,800 mL/hr at 07/29/18 0600  . vancomycin     PRN Meds: acetaminophen, alteplase, heparin, nitroGLYCERIN, ondansetron (ZOFRAN) IV, sodium chloride, sodium chloride flush   Vital Signs    Vitals:   07/29/18 0530 07/29/18 0600 07/29/18 0630 07/29/18 0700  BP: 105/72 111/79 106/75 112/74  Pulse:  92  88  Resp: 13 11 12 12   Temp:      TempSrc:      SpO2:  95%  96%  Weight: 82 kg     Height:        Intake/Output Summary (Last 24 hours) at 07/29/2018 0709 Last data filed at 07/29/2018 0700 Gross per 24 hour  Intake 1032.25 ml  Output 1530 ml  Net -497.75 ml   Last 3 Weights 07/29/2018 07/15/2018 07/06/2018  Weight (lbs) 180 lb 12.4 oz 180 lb 12.4 oz 198 lb  Weight (kg) 82 kg 82 kg 89.812 kg      Telemetry    Sinus with PVCs and 3 beats NSVT - Personally Reviewed   Physical Exam   GEN: No acute distress.  Neck: Positive JVD Cardiac: RRR, 2/6 systolic murmur Respiratory: Clear to auscultation bilaterally. GI: Soft, nontender, non-distended  MS: No edema Neuro:  Nonfocal  Psych: Normal affect   Labs    Chemistry Recent Labs  Lab 07/09/2018 1431 07/22/2018 1651  07/29/18 0416  NA 128*  --  133*  K 5.0  --  4.8  CL 89*  --  96*  CO2 21*  --  23  GLUCOSE 154*  --  138*  BUN 198*  --  156*  CREATININE 3.91*  --  2.75*  CALCIUM 7.8*  --  7.6*  PROT  --  7.2  --   ALBUMIN  --  3.6 3.4*  AST  --  1,103*  --   ALT  --  1,058*  --   ALKPHOS  --  86  --   BILITOT  --  3.0*  --   GFRNONAA 15*  --  24*  GFRAA 18*  --  27*  ANIONGAP 18*  --  14     Hematology Recent Labs  Lab 07/14/2018 1431 07/15/2018 1651 07/29/18 0150  WBC 9.6  --  10.2  RBC 4.42 4.46 4.09*  HGB 9.3*  --  8.6*  HCT 30.2*  --  27.9*  MCV 68.3*  --  68.2*  MCH 21.0*  --  21.0*  MCHC 30.8  --  30.8  RDW 21.4*  --  21.1*  PLT PLATELET CLUMPS NOTED ON SMEAR, COUNT APPEARS ADEQUATE 194 191    Cardiac Enzymes Recent Labs  Lab  07/11/2018 2049 07/29/18 0150  TROPONINI 38.76* 28.85*    Recent Labs  Lab 07/06/2018 1447 07/08/2018 1536  TROPIPOC 28.64* 26.71*     BNP Recent Labs  Lab 07/13/2018 1651  BNP 4,188.4*     DDimer  Recent Labs  Lab 07/31/2018 1651  DDIMER 12.38*     Radiology    Dg Chest 2 View  Result Date: 07/31/2018 CLINICAL DATA:  Shortness of breath for the past week with intermittent chest pain. EXAM: CHEST - 2 VIEW COMPARISON:  05/10/2006. FINDINGS: Stable enlarged cardiac silhouette. Interval band like area of patchy opacity in the right mid lung zone. Clear left lung. Stable mild diffuse prominence of the interstitial markings with normal vascularity. Decreased peribronchial thickening. Mild thoracolumbar spine degenerative changes. IMPRESSION: 1. Interval band like area of patchy opacity in the right mid lung zone. This could represent pneumonia, patchy atelectasis or small area of pulmonary infarction. 2. Stable cardiomegaly and mild chronic interstitial lung disease. Electronically Signed   By: Claudie Revering M.D.   On: 07/09/2018 14:36   US Renal  Result Date: 07/29/2018 CLINICAL DATA:  Acute kidney injury EXAM: RENAL / URINARY TRACT ULTRASOUND  COMPLETE COMPARISON:  None. FINDINGS: Right Kidney: Renal measurements: 11.1 cm. Echogenicity mildly increased. No mass or hydronephrosis visualized. Upper pole cyst measures 1.6 x 1.3 x 1.5 cm. Left Kidney: Not visualized due to inability of patient to be positioned adequately. Bladder: Decompressed by Foley catheter. IMPRESSION: 1. Mildly echogenic right kidney, compatible with chronic medical renal disease. 2. Nonvisualization of the left kidney. Electronically Signed   By: Ulyses Jarred M.D.   On: 07/29/2018 03:55   Dg Chest Port 1 View  Result Date: 07/29/2018 CLINICAL DATA:  Respiratory failure EXAM: PORTABLE CHEST 1 VIEW COMPARISON:  Yesterday FINDINGS: Cardiomegaly. Mild atelectasis or scarring along the right minor fissure. Interstitial coarsening without Kerley lines or effusion. No pneumothorax. Aortic tortuosity. IMPRESSION: Cardiomegaly and vascular congestion. Electronically Signed   By: Monte Fantasia M.D.   On: 07/29/2018 06:53    Patient Profile     63 y.o. male with past medical history of hypertension, hyperlipidemia admitted with recent but late presenting inferior lateral myocardial infarction, cardiogenic shock, acute renal failure and microcytic anemia.  Echocardiogram showed severe LV and RV dysfunction and moderate mitral regurgitation.  Assessment & Plan    1 status post inferior lateral myocardial infarction/cardiogenic shock-[place] improved to 55 this AM; CVP 21.  Continue milrinone at present dose.  Continue low-dose norepinephrine.  Continue aspirin and heparin.  Given elevated liver functions will hold statin for now.  No beta-blocker in setting of cardiogenic shock.  Given late presentation we elected not to proceed with cardiac catheterization particularly given comorbidities of acute renal failure and microcytic anemia.  Will need to consider prior to discharge if he improves.  2 acute renal failure-nephrology following; on CRRT.  Some improvement in cognition this  morning likely from improving uremia.  3 elevated liver functions-likely from shock liver.  We will continue to follow.  Should improve with above measures.  Hold Lipitor for now.  4 microcytic anemia-hemoglobin 8.6 this morning.  We will continue to follow.  Continue Protonix.  Will need to transfuse if hemoglobin falls further.  No evidence of active bleeding at present but will follow closely given patient is on heparin.  5 question pneumonia-patient on antibiotics per critical care medicine.  6 hyponatremia-improving this morning.  Likely secondary to volume excess at time of admission.  Patient remains critically ill and  prognosis is guarded.  CRITICAL CARE Performed by: Kirk Ruths   Total critical care time: 40 minutes  Critical care time was exclusive of separately billable procedures and treating other patients.  Critical care was necessary to treat or prevent imminent or life-threatening deterioration.  Critical care was time spent personally by me on the following activities: development of treatment plan with patient and/or surrogate as well as nursing, discussions with consultants, evaluation of patient's response to treatment, examination of patient, obtaining history from patient or surrogate, ordering and performing treatments and interventions, ordering and review of laboratory studies, ordering and review of radiographic studies, pulse oximetry and re-evaluation of patient's condition.   For questions or updates, please contact [place] Please consult [email or web address] for contact info under        Signed, Kirk Ruths, MD  07/29/2018, 7:09 AM

## 2018-07-29 NOTE — Progress Notes (Signed)
CRITICAL VALUE ALERT  Critical Value: TROPONIN 38.76   Date & Time Notied:  07/24/2018 2200  Provider Notified: Dr Rhae Hammock ( cards fellow)   Orders Received/Actions taken: NONE

## 2018-07-29 NOTE — Progress Notes (Signed)
[facility] Progress Note Patient Name: Samuel Bates DOB: [birth year] MRN: [id number]   Date of Service  07/29/2018  HPI/Events of Note  Patient in wide complex tachycardia - slow VT vs AFIB with aberrancy. Last K+ = 4.7 amd Mg++ = 2.7.   eICU Interventions  Will order: 1. Amiodarone IV bolus 150 mg IV over 10 minutes.  2. 12 Lead EKG STAT.  3. Please inform Cardiology of events.      Intervention Category Major Interventions: Arrhythmia - evaluation and management  Sommer,Steven Eugene 07/29/2018, 11:08 PM

## 2018-07-29 NOTE — Consult Note (Signed)
Advanced Heart Failure Team Consult Note   Primary Physician: Saintclair Halsted, FNP PCP-Cardiologist:  No primary care provider on file.  Reason for Consultation: Cardiogenic shock  HPI:    Samuel Bates is seen today for evaluation of cardiogenic shock at the request of Dr. Ardis Hughs.   63 y/o male with no previous cardiac history - has h/o HTN and tobacco use-  presented to ER yesterday with evidence of OOH inferolateral MI c/b severe cardiogenic shock. Renal failure (creatinine 3.9 - unclear baseline), lactic acidosis, sever microcytic anemia. Initial trop 38 -> 28 -> 24  Echo showed EF 15% with severe RV failure as well. Admitted to ICU.   Unsuccessful attempts to get IJ access bilaterally so trialysis cath placed in RFV. Co-ox drawn from femoral line 29%. Started on milrinone 0.25 and NE 5.   Remains on pressors and CRRT.  Kept even over night. Now pulling -50. Temp down to 96 on warming blanket.   Has received 1u of blood.   Lethargic but can answer questions and is oriented   Review of Systems: [y] = yes, [ ]  = no   General: Weight gain [ ] ; Weight loss [ ] ; Anorexia [ ] ; Fatigue [ ] ; Fever [ ] ; Chills [ ] ; Weakness Blue.Reese ]  Cardiac: Chest pain/pressure [ y]; Resting SOB [ ] ; Exertional SOB [ ] ; Orthopnea [ ] ; Pedal Edema [ ] ; Palpitations [ ] ; Syncope [ ] ; Presyncope [ ] ; Paroxysmal nocturnal dyspnea[ ]   Pulmonary: Cough [ ] ; Wheezing[ ] ; Hemoptysis[ ] ; Sputum [ ] ; Snoring [ ]   GI: Vomiting[ ] ; Dysphagia[ ] ; Melena[ ] ; Hematochezia [ ] ; Heartburn[ ] ; Abdominal pain [ ] ; Constipation [ ] ; Diarrhea [ ] ; BRBPR [ ]   GU: Hematuria[ ] ; Dysuria [ ] ; Nocturia[ ]   Vascular: Pain in legs with walking [ ] ; Pain in feet with lying flat [ ] ; Non-healing sores [ ] ; Stroke [ ] ; TIA [ ] ; Slurred speech [ ] ;  Neuro: Headaches[ ] ; Vertigo[ ] ; Seizures[ ] ; Paresthesias[ ] ;Blurred vision [ ] ; Diplopia [ ] ; Vision changes [ ]   Ortho/Skin: Arthritis [ y]; Joint pain Blue.Reese ]; Muscle pain [ ] ; Joint  swelling [ ] ; Back Pain [ ] ; Rash [ ]   Psych: Depression[ ] ; Anxiety[ ]   Heme: Bleeding problems [ ] ; Clotting disorders [ ] ; Anemia [ ]   Endocrine: Diabetes [ ] ; Thyroid dysfunction[ ]   Home Medications Prior to Admission medications   Medication Sig Start Date End Date Taking? Authorizing Provider  amLODipine (NORVASC) 10 MG tablet Take 10 mg by mouth daily.   Yes [provider]  atorvastatin (LIPITOR) 40 MG tablet Take 40 mg by mouth daily.   Yes [provider]  Cetirizine HCl 10 MG CAPS Take 1 capsule by mouth as needed (allergies).    Yes [provider]  hydrochlorothiazide (HYDRODIURIL) 25 MG tablet Take 25 mg by mouth daily.   Yes [provider]  losartan (COZAAR) 50 MG tablet Take 50 mg by mouth daily.   Yes [provider]  omega-3 fish oil (MAXEPA) 1000 MG CAPS capsule Take 2 capsules by mouth daily.   Yes [provider]    Past Medical History: Past Medical History:  Diagnosis Date  . Allergy   . Hx of adenomatous colonic polyps 03/07/2017  . Hyperlipidemia   . Hypertension     Past Surgical History: Past Surgical History:  Procedure Laterality Date  . COLONOSCOPY    . POLYPECTOMY    . TONSILLECTOMY  age 17    Family History: Family History  Problem Relation Age of Onset  . Colon polyps Brother   . Stroke Brother   . COPD Father   . COPD Sister   . Colon cancer Neg Hx   . Esophageal cancer Neg Hx   . Rectal cancer Neg Hx   . Stomach cancer Neg Hx   . Diabetes Neg Hx   . Colitis Neg Hx     Social History: Social History   Socioeconomic History  . Marital status: Single    Spouse name: Not on file  . Number of children: 3  . Years of education: Not on file  . Highest education level: Not on file  Occupational History  . Not on file  Social Needs  . Financial resource strain: Not on file  . Food insecurity:    Worry: Not on file    Inability: Not on file  . Transportation needs:     Medical: Not on file    Non-medical: Not on file  Tobacco Use  . Smoking status: Current Every Day Smoker    Types: Cigars  . Smokeless tobacco: Never Used  . Tobacco comment: has cut back not daily  Substance and Sexual Activity  . Alcohol use: Yes    Alcohol/week: 2.0 standard drinks    Types: 2 Cans of beer per week    Comment: 2-3 beers per day  . Drug use: Yes    Types: Marijuana    Comment: had some Saturday 04/07/2018  . Sexual activity: Not on file  Lifestyle  . Physical activity:    Days per week: Not on file    Minutes per session: Not on file  . Stress: Not on file  Relationships  . Social connections:    Talks on phone: Not on file    Gets together: Not on file    Attends religious service: Not on file    Active member of club or organization: Not on file    Attends meetings of clubs or organizations: Not on file    Relationship status: Not on file  Other Topics Concern  . Not on file  Social History Narrative  . Not on file    Allergies:  Allergies  Allergen Reactions  . Penicillins Hives    Did it involve swelling of the face/tongue/throat, SOB, or low BP? YES Did it involve sudden or severe rash/hives, skin peeling, or any reaction on the inside of your mouth or nose? NO Did you need to seek medical attention at a hospital or doctor's office? YES When did it last happen? 30 years ago If all above answers are "NO", may proceed with cephalosporin use.  . Simvastatin Other (See Comments)    insomnia    Objective:    Vital Signs:   Temp:  [96.4 F (35.8 C)-97.7 F (36.5 C)] 97.5 F (36.4 C) (01/26 0800) Pulse Rate:  [84-92] 88 (01/26 0800) Resp:  [11-28] 14 (01/26 1000) BP: (91-126)/(72-113) 104/72 (01/26 1000) SpO2:  [84 %-98 %] 97 % (01/26 0800) Weight:  [82 kg-89.8 kg] 82 kg (01/26 0530) Last BM Date: 07/23/2018  Weight change: Filed Weights   07/14/2018 1618 07/21/2018 2230 07/29/18 0530  Weight: 89.8 kg 82 kg 82 kg     Intake/Output:   Intake/Output Summary (Last 24 hours) at 07/29/2018 1110 Last data filed at 07/29/2018 1000 Gross per 24 hour  Intake 1137 ml  Output 1660 ml  Net -523 ml  Physical Exam    General:  Lying in bed. Lethargic but responds. On warming blanket.  No resp difficulty HEENT: normal Neck: supple. JVP to jaw . Carotids 2+ bilat; no bruits. No lymphadenopathy or thyromegaly appreciated. Cor: PMI nondisplaced. Regular rate & rhythm. +s3 + 2/6 MR Lungs: clear Abdomen: soft, nontender, nondistended. No hepatosplenomegaly. No bruits or masses. Good bowel sounds. Extremities: no cyanosis, clubbing, rash, edema  RFV trialysis  Neuro: alert & orientedx3, cranial nerves grossly intact. moves all 4 extremities w/o difficulty. Affect pleasant   Telemetry   NSR 80s Personally reviewed  EKG    NSR with inferolateral Qs. Personally reviewed   Labs   Basic Metabolic Panel: Recent Labs  Lab 07/11/2018 1431 07/29/18 0150 07/29/18 0416 07/29/18 0653  NA 128*  --  133* 134*  K 5.0  --  4.8 4.6  CL 89*  --  96* 98  CO2 21*  --  23 24  GLUCOSE 154*  --  138* 126*  BUN 198*  --  156* 121*  CREATININE 3.91*  --  2.75* 2.19*  CALCIUM 7.8*  --  7.6* 7.6*  MG  --  3.4*  --   --   PHOS  --   --  6.3*  --     Liver Function Tests: Recent Labs  Lab 07/17/2018 1651 07/29/18 0416 07/29/18 0653  AST 1,103*  --  756*  ALT 1,058*  --  945*  ALKPHOS 86  --  78  BILITOT 3.0*  --  3.4*  PROT 7.2  --  6.4*  ALBUMIN 3.6 3.4* 3.4*   No results for input(s): LIPASE, AMYLASE in the last 168 hours. Recent Labs  Lab 07/26/2018 1908  AMMONIA 21    CBC: Recent Labs  Lab 07/04/2018 1431 07/04/2018 1651 07/29/18 0150 07/29/18 0653  WBC 9.6  --  10.2 8.9  NEUTROABS  --  78.6*  --   --   HGB 9.3*  --  8.6* 8.3*  HCT 30.2*  --  27.9* 27.1*  MCV 68.3*  --  68.2* 68.1*  PLT PLATELET CLUMPS NOTED ON SMEAR, COUNT APPEARS ADEQUATE 194 191 186    Cardiac Enzymes: Recent Labs   Lab 07/06/2018 1651 07/15/2018 2049 07/29/18 0150 07/29/18 0653  CKTOTAL 608*  --   --   --   TROPONINI  --  38.76* 28.85* 24.43*    BNP: BNP (last 3 results) Recent Labs    07/27/2018 1651  BNP 4,188.4*    ProBNP (last 3 results) No results for input(s): PROBNP in the last 8760 hours.   CBG: Recent Labs  Lab 08/02/2018 1532  GLUCAP 148*    Coagulation Studies: Recent Labs    07/12/2018 1651  LABPROT 20.8*  20.9*  INR 1.81  1.83     Imaging   Dg Chest 2 View  Result Date: 07/31/2018 CLINICAL DATA:  Shortness of breath for the past week with intermittent chest pain. EXAM: CHEST - 2 VIEW COMPARISON:  05/10/2006. FINDINGS: Stable enlarged cardiac silhouette. Interval band like area of patchy opacity in the right mid lung zone. Clear left lung. Stable mild diffuse prominence of the interstitial markings with normal vascularity. Decreased peribronchial thickening. Mild thoracolumbar spine degenerative changes. IMPRESSION: 1. Interval band like area of patchy opacity in the right mid lung zone. This could represent pneumonia, patchy atelectasis or small area of pulmonary infarction. 2. Stable cardiomegaly and mild chronic interstitial lung disease. Electronically Signed   By: Percell Locus.D.  On: 07/23/2018 14:36   US Renal  Result Date: 07/29/2018 CLINICAL DATA:  Acute kidney injury EXAM: RENAL / URINARY TRACT ULTRASOUND COMPLETE COMPARISON:  None. FINDINGS: Right Kidney: Renal measurements: 11.1 cm. Echogenicity mildly increased. No mass or hydronephrosis visualized. Upper pole cyst measures 1.6 x 1.3 x 1.5 cm. Left Kidney: Not visualized due to inability of patient to be positioned adequately. Bladder: Decompressed by Foley catheter. IMPRESSION: 1. Mildly echogenic right kidney, compatible with chronic medical renal disease. 2. Nonvisualization of the left kidney. Electronically Signed   By: Ulyses Jarred M.D.   On: 07/29/2018 03:55   Dg Chest Port 1 View  Result Date:  07/29/2018 CLINICAL DATA:  Respiratory failure EXAM: PORTABLE CHEST 1 VIEW COMPARISON:  Yesterday FINDINGS: Cardiomegaly. Mild atelectasis or scarring along the right minor fissure. Interstitial coarsening without Kerley lines or effusion. No pneumothorax. Aortic tortuosity. IMPRESSION: Cardiomegaly and vascular congestion. Electronically Signed   By: Monte Fantasia M.D.   On: 07/29/2018 06:53      Medications:     Current Medications: . sodium chloride   Intravenous Once  . aspirin EC  81 mg Oral Daily  . Chlorhexidine Gluconate Cloth  6 each Topical Daily  . norepinephrine      . pantoprazole (PROTONIX) IV  40 mg Intravenous BID  . sodium chloride flush  10-40 mL Intracatheter Q12H     Infusions: .  prismasol BGK 4/2.5 400 mL/hr at 07/17/2018 2212  .  prismasol BGK 4/2.5 200 mL/hr at 07/10/2018 2057  . ceFEPime (MAXIPIME) IV 200 mL/hr at 07/29/18 1000  . heparin 1,400 Units/hr (07/29/18 1000)  . milrinone 0.125 mcg/kg/min (07/29/18 1000)  . norepinephrine (LEVOPHED) Adult infusion 5 mcg/min (07/29/18 1000)  . prismasol BGK 4/2.5 1,800 mL/hr at 07/29/18 0850  . vancomycin         Patient Profile   63 y.o. male with past medical history of hypertension, hyperlipidemia admitted with recent but late presenting inferior lateral myocardial infarction, cardiogenic shock, acute renal failure and microcytic anemia.  Echocardiogram showed severe LV and RV dysfunction and moderate mitral regurgitation.   Assessment/Plan   1. Acute systolic HF with cardiogenic shock due to OOH MI - Echo 1/5 EF 15% severe RV dysfunction and moderate MR - Initial co-ox 28% but this was drawn femorally so inaccurate. Co-ox today (also femoral) 58% - Will continue NE and milrinone for now. Ideally will get central access in IJ or [place] (need to try and spare [place] for HD cath if needed) to get more accurate co-ox - CVP elevated - Continue volume removal with CRRT. Increase as tolerated.  - No ACE/ARB/ARNI or  b-blocker with shock and AKI - Will attempt to place central access today  2. CAD - s/p OOH in inferolateral MI - initial trop 28. Now trending down - no current angina so no role for emergent cath - continue ASA and statin. No b-blocker with shock   - Will need cath prior to d/c if renal function permits  3. AKI/ESRD with uremia - likely due to ATN and hypoperfusion. Initial BUN/CR 198/3.91 - unclear baseline - Renal following CRT started on 1/25 via RFV cath - Renal u/s 1/26: Mildly echogenic right kidney, compatible with chronic medical renal disease. - Continue heparin. Watch closely for bleeding. Discussed dosing with PharmD personally.  4. Microcytic anemia due to IDA - hgb 9.3 on admit with MCV 68.  - smear without schistocytes - has received 2u RBCS - Iron stores low. Will give Feraheme -  Will eventually need GI w/u - Continue protonix  5. Shock liver - improving with pressors  CRITICAL CARE Performed by: Glori Bickers  Total critical care time: 55 minutes  Critical care time was exclusive of separately billable procedures and treating other patients.  Critical care was necessary to treat or prevent imminent or life-threatening deterioration.  Critical care was time spent personally by me (independent of midlevel providers or residents) on the following activities: development of treatment plan with patient and/or surrogate as well as nursing, discussions with consultants, evaluation of patient's response to treatment, examination of patient, obtaining history from patient or surrogate, ordering and performing treatments and interventions, ordering and review of laboratory studies, ordering and review of radiographic studies, pulse oximetry and re-evaluation of patient's condition.    Length of Stay: 1  Glori Bickers, MD  07/29/2018, 11:10 AM  Advanced Heart Failure Team Pager [phone] (M-F; [employer])  Please contact [facility] for night-coverage  after hours (4p -7a ) and weekends on [email or web address]

## 2018-07-29 NOTE — Procedures (Signed)
Radial Artery Catheter Insertion Procedure Note Ibrohim Simmers [id number] 1955/12/16  Procedure: Insertion of Radial Artery Catheter Indications: BP monitoring  Procedure Details Consent: Risks of procedure as well as the alternatives and risks of each were explained to the (patient/caregiver).  Consent for procedure obtained. Time Out: Verified patient identification, verified procedure, site/side was marked, verified correct patient position, special equipment/implants available, medications/allergies/relevent history reviewed, required imaging and test results available.  Performed  Maximum sterile technique was used including antiseptics, cap, gloves, gown, hand hygiene, mask and sheet. Skin prep: Chlorhexidine; local anesthetic administered A antimicrobial bonded/coated single lumen catheter was placed in the left radial artery using the Seldinger technique.  Evaluation Blood flow good Complications: No apparent complications Patient did tolerate procedure well.   Glori Bickers MD 07/29/2018, 12:50 PM

## 2018-07-29 NOTE — H&P (Signed)
Lelon Perla, MD  Physician  Cardiology  Consult Note  Addendum  Date of Service:  07/27/2018 4:08 PM          Expand All Collapse All    Show:Clear all [x] Manual[x] Template[] Copied  Added by: [x] Oakley Orban, Denice Bors, MD  [] Hover for details  Cardiology Consultation:   Patient ID: Samuel Bates MRN: [id number]; DOB: 05-07-1956  Admit date: 07/04/2018 Date of Consult: 07/11/2018  Primary Care Provider: Saintclair Halsted, FNP Primary Cardiologist: New; Dr Stanford Breed    Patient Profile:   Samuel Bates is a 63 y.o. male with a hx of hypertension, hyperlipidemia and polyps who is being seen today for the evaluation of acute inferolateral myocardial infarction at the request of Gareth Morgan MD.  History of Present Illness:   No prior cardiac history.  Patient somewhat vague today.  However he states he had chest pain 1 week ago for 1 hour that was substernal and radiated to his left upper extremity.  There was associated nausea and vomiting but he denies diaphoresis or dyspnea at that time.  He had recurrent pain by his report on Sunday and last chest pain was on Monday.  Over the past 5 days he has had worsening dyspnea on exertion but denies orthopnea or PND.  He denies pedal edema.  He has not had fevers or chills but has had a cough.  It is nonproductive.  Because of his symptoms he presented for further evaluation.  Note he also denies melena or hematochezia.      Past Medical History:  Diagnosis Date  . Allergy   . Hx of adenomatous colonic polyps 03/07/2017  . Hyperlipidemia   . Hypertension          Past Surgical History:  Procedure Laterality Date  . COLONOSCOPY    . POLYPECTOMY    . TONSILLECTOMY     age 63     Inpatient Medications: Scheduled Meds:  Continuous Infusions:  PRN Meds:   Allergies:    Allergies  Allergen Reactions  . Penicillins Hives    Did it involve swelling of the face/tongue/throat, SOB, or low  BP? YES Did it involve sudden or severe rash/hives, skin peeling, or any reaction on the inside of your mouth or nose? NO Did you need to seek medical attention at a hospital or doctor's office? YES When did it last happen? 30 years ago If all above answers are "NO", may proceed with cephalosporin use.  . Simvastatin Other (See Comments)    insomnia    Social History:   Social History        Socioeconomic History  . Marital status: Single    Spouse name: Not on file  . Number of children: 3  . Years of education: Not on file  . Highest education level: Not on file  Occupational History  . Not on file  Social Needs  . Financial resource strain: Not on file  . Food insecurity:    Worry: Not on file    Inability: Not on file  . Transportation needs:    Medical: Not on file    Non-medical: Not on file  Tobacco Use  . Smoking status: Current Every Day Smoker    Types: Cigars  . Smokeless tobacco: Never Used  . Tobacco comment: has cut back not daily  Substance and Sexual Activity  . Alcohol use: Yes    Alcohol/week: 2.0 standard drinks    Types: 2 Cans of beer per week  Comment: 2-3 beers per day  . Drug use: Yes    Types: Marijuana    Comment: had some Saturday 04/07/2018  . Sexual activity: Not on file  Lifestyle  . Physical activity:    Days per week: Not on file    Minutes per session: Not on file  . Stress: Not on file  Relationships  . Social connections:    Talks on phone: Not on file    Gets together: Not on file    Attends religious service: Not on file    Active member of club or organization: Not on file    Attends meetings of clubs or organizations: Not on file    Relationship status: Not on file  . Intimate partner violence:    Fear of current or ex partner: Not on file    Emotionally abused: Not on file    Physically abused: Not on file    Forced sexual activity: Not on file  Other Topics  Concern  . Not on file  Social History Narrative  . Not on file    Family History:         Family History  Problem Relation Age of Onset  . Colon polyps Brother   . Stroke Brother   . COPD Father   . COPD Sister   . Colon cancer Neg Hx   . Esophageal cancer Neg Hx   . Rectal cancer Neg Hx   . Stomach cancer Neg Hx   . Diabetes Neg Hx   . Colitis Neg Hx      ROS:  Please see the history of present illness.  Patient denies fevers, chills, hemoptysis, melena, hematochezia, hematemesis.  He has had dyspnea on exertion and chest pain as outlined in HPI. All other ROS reviewed and negative.     Physical Exam/Data:         Vitals:   07/16/2018 1500 07/30/2018 1515 07/22/2018 1530 07/04/2018 1545  BP: (!) 116/94 107/88 (!) 116/99 (!) 117/95  Pulse: 88     Resp: (!) 23  (!) 27 18  Temp:      TempSrc:      SpO2:       No intake or output data in the 24 hours ending 07/17/2018 1608 Last 3 Weights 04/09/2018 03/30/2018 02/28/2017  Weight (lbs) 203 lb 203 lb 195 lb  Weight (kg) 92.08 kg 92.08 kg 88.451 kg     There is no height or weight on file to calculate BMI.  General:  Well nourished, well developed HEENT: normal Lymph: no adenopathy Neck: positive JVD Endocrine:  No thryomegaly Vascular: No carotid bruits; FA pulses 2+ bilaterally without bruits  Cardiac:  normal S1, S2; RRR; 2/6 systolic murmur apex Lungs:  clear to auscultation bilaterally, no wheezing, rhonchi or rales  Abd: soft, nontender, no hepatomegaly  Ext: no edema Musculoskeletal:  No deformities; 2+ radial and femoral pulses.  2+ right dorsalis pedis and 1+ left. Skin: warm and dry  Neuro:  CNs 2-12 intact, no focal abnormalities noted; somnelent Psych:  Normal affect   EKG:  The EKG was personally reviewed and demonstrates: Sinus rhythm, right axis deviation, inferior lateral ST elevation with inferior lateral Q waves.   Laboratory Data:  Chemistry LastLabs     Recent  Labs  Lab 07/17/2018 1431  NA 128*  K 5.0  CL 89*  CO2 21*  GLUCOSE 154*  BUN 198*  CREATININE 3.91*  CALCIUM 7.8*  GFRNONAA 15*  GFRAA 18*  ANIONGAP  18*      Hematology LastLabs     Recent Labs  Lab 07/23/2018 1431  WBC 9.6  RBC 4.42  HGB 9.3*  HCT 30.2*  MCV 68.3*  MCH 21.0*  MCHC 30.8  RDW 21.4*  PLT PLATELET CLUMPS NOTED ON SMEAR, COUNT APPEARS ADEQUATE      LastLabs      Recent Labs  Lab 07/04/2018 1447 07/13/2018 1536  TROPIPOC 28.64* 26.71*      Radiology/Studies:  Dg Chest 2 View  Result Date: 07/27/2018 CLINICAL DATA:  Shortness of breath for the past week with intermittent chest pain. EXAM: CHEST - 2 VIEW COMPARISON:  05/10/2006. FINDINGS: Stable enlarged cardiac silhouette. Interval band like area of patchy opacity in the right mid lung zone. Clear left lung. Stable mild diffuse prominence of the interstitial markings with normal vascularity. Decreased peribronchial thickening. Mild thoracolumbar spine degenerative changes. IMPRESSION: 1. Interval band like area of patchy opacity in the right mid lung zone. This could represent pneumonia, patchy atelectasis or small area of pulmonary infarction. 2. Stable cardiomegaly and mild chronic interstitial lung disease. Electronically Signed   By: Claudie Revering M.D.   On: 07/27/2018 14:36    Assessment and Plan:   1. Recent inferior lateral myocardial infarction-patient presents with complaints of chest pain intermittently last 5 days ago.  He then developed severe dyspnea on exertion.  His electrocardiogram shows inferolateral ST elevation but there are inferior lateral Q waves as well. His initial troponin is 28 with follow-up being 26.7.  I think this is a late presentation infarct and given that he is not having active chest pain and has severe acute renal insufficiency I do not think we need to proceed with cardiac catheterization emergently.  Will treat with aspirin, heparin and statin.  His blood pressure  is borderline and he will likely require dialysis for uremia.  I will therefore not add a beta-blocker.  Check echocardiogram for LV function.  Prognosis is guarded as he is critically ill.  May need milrinone.  He will ultimately require cardiac catheterization as his renal function improves. 2. Acute renal failure-question secondary to hypoperfusion of kidneys with recent infarct.  Patient will need nephrology consultation and probable dialysis.  He is somewhat somnolent and I wonder if this is secondary to uremia. 3. Microcytic anemia-patient denies melena, hematochezia or hematemesis.  He will need further work-up as he improves. 4. History of tobacco use-patient counseled on discontinuing.  Addendum-echocardiogram shows ejection fraction 10 to 15%, moderate mitral regurgitation, moderate left atrial enlargement, mild right ventricular enlargement with severely reduced function and severe right atrial enlargement.  There was moderate tricuspid regurgitation.  I have discussed the patient with Dr. Tamala Julian and Dr. Haroldine Laws.  He is being admitted to CCU.  We do not believe there is an indication to take patient emergently to cardiac catheterization laboratory as we feel his presentation was late.  He is heme positive on rectal exam and has a significant microcytic anemia.  Question if portion of BUN elevation related to GI bleeding (vs poor perfusion of kidneys related to recent MI).  I will therefore not add IV heparin at this point but will cover with subcutaneous heparin. Will add protonix. If hemoglobin remains stable we can consider adding IV heparin later.  Nephrology is seeing the patient and we feel he will likely need dialysis Discussed with Dr Melvia Heaps).  We will also place central line and check CVP and Coox.  If significantly decreased will need milrinone.  If  he becomes hypotensive with dialysis we will add norepinephrine.  Patient is critically ill and prognosis is guarded.  CRITICAL  CARE Performed by: Kirk Ruths   Total critical care time: 60 minutes  Critical care time was exclusive of separately billable procedures and treating other patients.  Critical care was necessary to treat or prevent imminent or life-threatening deterioration.  Critical care was time spent personally by me on the following activities: development of treatment plan with patient and/or surrogate as well as nursing, discussions with consultants, evaluation of patient's response to treatment, examination of patient, obtaining history from patient or surrogate, ordering and performing treatments and interventions, ordering and review of laboratory studies, ordering and review of radiographic studies, pulse oximetry and re-evaluation of patient's condition.  For questions or updates, please contact [place] Please consult [email or web address] for contact info under     Signed, Kirk Ruths, MD  07/27/2018 4:08 PM

## 2018-07-29 NOTE — Progress Notes (Signed)
ANTICOAGULATION CONSULT NOTE - Follow Up Consult  Pharmacy Consult for heparin Indication: late-presentation MI  Labs: Recent Labs    08/03/2018 1431 07/10/2018 1651 07/29/2018 2049 07/29/18 0150 07/29/18 0416  HGB 9.3*  --   --  8.6*  --   HCT 30.2*  --   --  27.9*  --   PLT PLATELET CLUMPS NOTED ON SMEAR, COUNT APPEARS ADEQUATE 194  --  191  --   APTT  --  32  --  55*  --   LABPROT  --  20.8*  20.9*  --   --   --   INR  --  1.81  1.83  --   --   --   HEPARINUNFRC  --   --   --   --  0.10*  CREATININE 3.91*  --   --   --  2.75*  CKTOTAL  --  608*  --   --   --   TROPONINI  --   --  38.76* 28.85*  --     Assessment: 63yo male subtherapeutic on heparin with initial dosing for MI; no gtt issues or signs of bleeding per RN.  Goal of Therapy:  Heparin level 0.3-0.7 units/ml   Plan:  Will rebolus with heparin 2000 units and increase heparin gtt by 4 units/kg/hr to 1400 units/hr and check level in 8 hours.    Wynona Neat, PharmD, BCPS  07/29/2018,6:06 AM

## 2018-07-29 NOTE — Procedures (Signed)
Central Venous Catheter Insertion Procedure Note [place] Gipe [id number] January 09, 1956  Procedure: Insertion of Central Venous Catheter Indications: Assessment of intravascular volume  Procedure Details Consent: Risks of procedure as well as the alternatives and risks of each were explained to the (patient/caregiver).  Consent for procedure obtained. Time Out: Verified patient identification, verified procedure, site/side was marked, verified correct patient position, special equipment/implants available, medications/allergies/relevent history reviewed, required imaging and test results available.  Performed  Maximum sterile technique was used including antiseptics, cap, gloves, gown, hand hygiene, mask and sheet. Skin prep: Chlorhexidine; local anesthetic administered A antimicrobial bonded/coated triple lumen catheter was placed in the left internal jugular vein using the Seldinger technique using u/s guidance. When feeding the wire there was some resistance distally but we had the wire in far enough to place the catheter safely.  Evaluation Blood flow good Complications: No apparent complications Patient did tolerate procedure well. Chest X-ray ordered to verify placement.  CXR: pending.  Glori Bickers MD 07/29/2018, 12:48 PM

## 2018-07-29 NOTE — Progress Notes (Addendum)
NAME:  Samuel Bates, MRN:  [id number], DOB:  [birth year], LOS: 1 ADMISSION DATE:  07/27/2018, CONSULTATION DATE:  1/25 REFERRING MD: Gareth Morgan  , CHIEF COMPLAINT:  Shortness of breath x 5 days   Brief History   63 year old man presented 1/25 with confusion, shortness of breath on exertion, non productive cough for 5 days. Symptoms were proceeded by chest pain which radiated to his left arm and were associated with nausea and vomiting one week prior. EKG showed inferolateral ST elevations and Q waves and he was found to have a troponin that peaked at 28.  Cardiology evaluated and thought this was a late presentation infarct and given acute renal injury with lack of active chest pain the plan was for medical management.   Past Medical History  HTN  HLD  Colon polyps  Current daily cigar smoker   Significant Hospital Events   1/25 - admission   Consults:  PCCM Cards Renal  Procedures:  1/25 R femoral HD catheter   Significant Diagnostic Tests:  1/25 echocardiogram EF 10-15%, mild concentric hypertrophy, restrictive physiology, akinesis of the apical myocardium, moderated mitral regurgitation, moderately dilated left atrium, severely dilated right atrium, moderate tricuspid regurgitation, pulmonary artery pressure 45 mm Hg    1/25 renal US - echogenic right kidney consistent with medical renal disease, left kidney not visualized due to positioning   Micro Data:  1/25 - blood cultures - in process  MRSA neg   Antimicrobials:  Vanc 1/26>> Cefepime 1/25>>  Interim history/subjective:  Denies chest pain or difficulty breathing this morning. He does feel fatigued and reports that his mentation is foggy but appears oriented. This morning his temp was found 97 and he was shivering, he was placed on a bare hugger.   Objective   Blood pressure 105/74, pulse 88, temperature (!) 97.5 F (36.4 C), temperature source Oral, resp. rate 11, height 6\' 3"  (1.905 m), weight 82 kg, SpO2 97  %. CVP:  [15 mmHg-24 mmHg] 15 mmHg      Intake/Output Summary (Last 24 hours) at 07/29/2018 0855 Last data filed at 07/29/2018 0800 Gross per 24 hour  Intake 1069.41 ml  Output 1568 ml  Net -498.59 ml   Filed Weights   08/03/2018 1618 07/30/2018 2230 07/29/18 0530  Weight: 89.8 kg 82 kg 82 kg    Examination: General: ill appearing, there is a bare hugger in place and CRRT is running  HENT: normocephalic, atraumatic, Roper in place  Lungs: course breath sounds at the bases, clear lung sounds over the anterior lung fields without wheezes or rhonchi  Cardiovascular: regular rate and rhythm, no murmur  Abdomen: soft, non tender, non distended  Extremities: no peripheral edema, extremities are warm  Neuro: awake, alert, oriented   Resolved Hospital Problem list     Assessment & Plan:   Subacute inferior lateral myocardial infarction  - trending troponin  - medical management - heparin, aspirin - holding off on statin given elevated transaminase  - holding off on beta blocker for cardiogenic shock  - continue to encourage cessation of tobacco use  - cardiology following, possible cath when renal function is improved   Cardiogenic shock  Systolic congestive heart failure  Elevated BNP  Unknown baseline cardiac function, no prior echo available.  - milrinone, levophed to keep MAP > 65  Hypoxic respiratory failure  ? Pneumonia  Hypoxia likely secondary to cardiogenic shock. Abnormalities on CXR more consistent with pulmonary edema.  - titrate supplemental oxygen to SpO2 94%  -  afebrile, no leukocytosis, negative MRSA . I feel that it is unlikely that this is pneumonia, discontinue vancomycin and cefepime after he has received 48 hours of coverage tomorrow   Acute kidney injury, non oliguric  Hyponatremia  Last renal function testing available is in care everywhere from 2014 when he had a creatinine of 1.6 and GFR 46. Renal ultrasound obtained this admission is consistent with  chronic renal disease.  - creatinine improving with improvement in perfusion  - nephrology following  - follow urine output and BMP   Elevated transaminase  Likely shock liver but unknown baseline. Ammonia was 21 when checked at the time of admission.  - continue to trend   Microcytic Anemia 2/2 GI bleed   Hx of colon adenomas  Reports dark tarry stools in the weeks leading up to admission. Last colonoscopy 04/2018 showed three hyperplastic polyps and two adenomas which were removed with a cold snare, Diverticulosis, and Internal hemorrhoids. - Hgb 8.3 - transfuse for hgb < 8 given acute coronary syndrome. Cardiology has ordered a unit to be given today  - continue protonix 40 mg BID  - would perform EGD/ colonoscopy when stable   Hypocalcemia  Ca 7.7 when corrected for albumin. Normal Mag and phos.  - ? provide calcium carbonate  - ? Obtain PTH, 25 OH vitamin D, ionized calcium - monitor QT   HTN  - hold home amlodipine and hydralazine   Best practice:  Diet: heart health  Pain/Anxiety/Delirium protocol (if indicated): na VAP protocol (if indicated): na DVT prophylaxis: heparin gtt  GI prophylaxis: IV protonix 40 mg BID  Glucose control: CBG monitoring  Mobility: bed rest Code Status: full  Family Communication: patient has contacted family and updated them earlier today  Disposition:   Labs   CBC: Recent Labs  Lab 07/25/2018 1431 07/12/2018 1651 07/29/18 0150 07/29/18 0653  WBC 9.6  --  10.2 8.9  NEUTROABS  --  78.6*  --   --   HGB 9.3*  --  8.6* 8.3*  HCT 30.2*  --  27.9* 27.1*  MCV 68.3*  --  68.2* 68.1*  PLT PLATELET CLUMPS NOTED ON SMEAR, COUNT APPEARS ADEQUATE 194 191 188    Basic Metabolic Panel: Recent Labs  Lab 07/05/2018 1431 07/29/18 0150 07/29/18 0416 07/29/18 0653  NA 128*  --  133* 134*  K 5.0  --  4.8 4.6  CL 89*  --  96* 98  CO2 21*  --  23 24  GLUCOSE 154*  --  138* 126*  BUN 198*  --  156* 121*  CREATININE 3.91*  --  2.75* 2.19*    CALCIUM 7.8*  --  7.6* 7.6*  MG  --  3.4*  --   --   PHOS  --   --  6.3*  --    GFR: Estimated Creatinine Clearance: 40.6 mL/min (A) (by C-G formula based on SCr of 2.19 mg/dL (H)). Recent Labs  Lab 07/07/2018 1431 07/14/2018 1651 07/10/2018 1908 07/29/18 0150 07/29/18 0416 07/29/18 0653  WBC 9.6  --   --  10.2  --  8.9  LATICACIDVEN  --  2.2* 2.3*  --  2.1*  --     Liver Function Tests: Recent Labs  Lab 07/21/2018 1651 07/29/18 0416 07/29/18 0653  AST 1,103*  --  756*  ALT 1,058*  --  945*  ALKPHOS 86  --  78  BILITOT 3.0*  --  3.4*  PROT 7.2  --  6.4*  ALBUMIN  3.6 3.4* 3.4*   No results for input(s): LIPASE, AMYLASE in the last 168 hours. Recent Labs  Lab 07/07/2018 1908  AMMONIA 21    ABG    Component Value Date/Time   O2SAT 58.5 07/29/2018 0815     Coagulation Profile: Recent Labs  Lab 07/29/2018 1651  INR 1.81  1.83    Cardiac Enzymes: Recent Labs  Lab 07/12/2018 1651 07/27/2018 2049 07/29/18 0150 07/29/18 0653  CKTOTAL 608*  --   --   --   TROPONINI  --  38.76* 28.85* 24.43*    HbA1C: No results found for: HGBA1C  CBG: Recent Labs  Lab 07/27/2018 1532  GLUCAP 148*    Review of Systems:   Denies chest pain or shortness of breath.   Past Medical History  He,  has a past medical history of Allergy, adenomatous colonic polyps (03/07/2017), Hyperlipidemia, and Hypertension.   Surgical History    Past Surgical History:  Procedure Laterality Date  . COLONOSCOPY    . POLYPECTOMY    . TONSILLECTOMY     age 20     Social History   reports that he has been smoking cigars. He has never used smokeless tobacco. He reports current alcohol use of about 2.0 standard drinks of alcohol per week. He reports current drug use. Drug: Marijuana.   Family History   His family history includes COPD in his father and sister; Colon polyps in his brother; Stroke in his brother. There is no history of Colon cancer, Esophageal cancer, Rectal cancer, Stomach cancer,  Diabetes, or Colitis.   Allergies Allergies  Allergen Reactions  . Penicillins Hives    Did it involve swelling of the face/tongue/throat, SOB, or low BP? YES Did it involve sudden or severe rash/hives, skin peeling, or any reaction on the inside of your mouth or nose? NO Did you need to seek medical attention at a hospital or doctor's office? YES When did it last happen? 30 years ago If all above answers are "NO", may proceed with cephalosporin use.  . Simvastatin Other (See Comments)    insomnia     Home Medications  Prior to Admission medications   Medication Sig Start Date End Date Taking? Authorizing Provider  amLODipine (NORVASC) 10 MG tablet Take 10 mg by mouth daily.   Yes [provider]  atorvastatin (LIPITOR) 40 MG tablet Take 40 mg by mouth daily.   Yes [provider]  Cetirizine HCl 10 MG CAPS Take 1 capsule by mouth as needed (allergies).    Yes [provider]  hydrochlorothiazide (HYDRODIURIL) 25 MG tablet Take 25 mg by mouth daily.   Yes [provider]  losartan (COZAAR) 50 MG tablet Take 50 mg by mouth daily.   Yes [provider]  omega-3 fish oil (MAXEPA) 1000 MG CAPS capsule Take 2 capsules by mouth daily.   Yes [provider]     Critical care time: **

## 2018-07-30 DIAGNOSIS — R570 Cardiogenic shock: Secondary | ICD-10-CM

## 2018-07-30 DIAGNOSIS — R0902 Hypoxemia: Secondary | ICD-10-CM

## 2018-07-30 LAB — COOXEMETRY PANEL
Carboxyhemoglobin: 1.3 % (ref 0.5–1.5)
Carboxyhemoglobin: 1.5 % (ref 0.5–1.5)
Methemoglobin: 2.1 % — ABNORMAL HIGH (ref 0.0–1.5)
Methemoglobin: 1.2 % (ref 0.0–1.5)
O2 Saturation: 53 %
O2 Saturation: 57 %
Total hemoglobin: 9.6 g/dL — ABNORMAL LOW (ref 12.0–16.0)
Total hemoglobin: 9.5 g/dL — ABNORMAL LOW (ref 12.0–16.0)

## 2018-07-30 LAB — RENAL FUNCTION PANEL
ANION GAP: 14 (ref 5–15)
Albumin: 3.2 g/dL — ABNORMAL LOW (ref 3.5–5.0)
Albumin: 3.2 g/dL — ABNORMAL LOW (ref 3.5–5.0)
Anion gap: 13 (ref 5–15)
BUN: 68 mg/dL — ABNORMAL HIGH (ref 8–23)
BUN: 48 mg/dL — ABNORMAL HIGH (ref 8–23)
CHLORIDE: 96 mmol/L — AB (ref 98–111)
CO2: 22 mmol/L (ref 22–32)
CO2: 21 mmol/L — ABNORMAL LOW (ref 22–32)
Calcium: 7.7 mg/dL — ABNORMAL LOW (ref 8.9–10.3)
Calcium: 7.7 mg/dL — ABNORMAL LOW (ref 8.9–10.3)
Chloride: 96 mmol/L — ABNORMAL LOW (ref 98–111)
Creatinine, Ser: 1.88 mg/dL — ABNORMAL HIGH (ref 0.61–1.24)
Creatinine, Ser: 1.93 mg/dL — ABNORMAL HIGH (ref 0.61–1.24)
GFR calc Af Amer: 43 mL/min — ABNORMAL LOW (ref >60)
GFR calc Af Amer: 42 mL/min — ABNORMAL LOW (ref >60)
GFR calc non Af Amer: 37 mL/min — ABNORMAL LOW (ref >60)
GFR calc non Af Amer: 36 mL/min — ABNORMAL LOW (ref >60)
Glucose, Bld: 136 mg/dL — ABNORMAL HIGH (ref 70–99)
Glucose, Bld: 127 mg/dL — ABNORMAL HIGH (ref 70–99)
PHOSPHORUS: 3.2 mg/dL (ref 2.5–4.6)
Phosphorus: 3.1 mg/dL (ref 2.5–4.6)
Potassium: 4.4 mmol/L (ref 3.5–5.1)
Potassium: 4.2 mmol/L (ref 3.5–5.1)
Sodium: 131 mmol/L — ABNORMAL LOW (ref 135–145)
Sodium: 131 mmol/L — ABNORMAL LOW (ref 135–145)

## 2018-07-30 LAB — CBC
HEMATOCRIT: 30.5 % — AB (ref 39.0–52.0)
Hemoglobin: 9.2 g/dL — ABNORMAL LOW (ref 13.0–17.0)
MCH: 21 pg — ABNORMAL LOW (ref 26.0–34.0)
MCHC: 30.2 g/dL (ref 30.0–36.0)
MCV: 69.6 fL — ABNORMAL LOW (ref 80.0–100.0)
Platelets: 188 10*3/uL (ref 150–400)
RBC: 4.38 MIL/uL (ref 4.22–5.81)
RDW: 22 % — ABNORMAL HIGH (ref 11.5–15.5)
WBC: 12 10*3/uL — ABNORMAL HIGH (ref 4.0–10.5)
nRBC: 47.9 % — ABNORMAL HIGH (ref 0.0–0.2)

## 2018-07-30 LAB — TYPE AND SCREEN
ABO/RH(D): B POS
Antibody Screen: NEGATIVE
Unit division: 0

## 2018-07-30 LAB — BPAM RBC
Blood Product Expiration Date: 202002202359
ISSUE DATE / TIME: 202001261106
UNIT TYPE AND RH: 7300

## 2018-07-30 LAB — HEPATIC FUNCTION PANEL
ALT: 767 U/L — ABNORMAL HIGH (ref 0–44)
AST: 284 U/L — ABNORMAL HIGH (ref 15–41)
Albumin: 3.1 g/dL — ABNORMAL LOW (ref 3.5–5.0)
Alkaline Phosphatase: 78 U/L (ref 38–126)
BILIRUBIN TOTAL: 3 mg/dL — AB (ref 0.3–1.2)
Bilirubin, Direct: 1.1 mg/dL — ABNORMAL HIGH (ref 0.0–0.2)
Indirect Bilirubin: 1.9 mg/dL — ABNORMAL HIGH (ref 0.3–0.9)
Total Protein: 6.7 g/dL (ref 6.5–8.1)

## 2018-07-30 LAB — HEPARIN LEVEL (UNFRACTIONATED): Heparin Unfractionated: 0.56 IU/mL (ref 0.30–0.70)

## 2018-07-30 LAB — APTT: APTT: 85 s — AB (ref 24–36)

## 2018-07-30 LAB — MAGNESIUM: Magnesium: 2.9 mg/dL — ABNORMAL HIGH (ref 1.7–2.4)

## 2018-07-30 MED ORDER — ORAL CARE MOUTH RINSE
15.0000 mL | Freq: Two times a day (BID) | OROMUCOSAL | Status: DC
  Administered 2018-07-30 – 2018-08-16 (×20): 15 mL via OROMUCOSAL

## 2018-07-30 NOTE — Progress Notes (Signed)
ANTICOAGULATION CONSULT NOTE - Follow Up Consult  Pharmacy Consult for heparin Indication: late-presentation MI  Labs: Recent Labs    07/27/2018 1651 07/16/2018 2049 07/29/18 0150 07/29/18 0416 07/29/18 0653 07/29/18 1428 07/29/18 1607 07/29/18 2008 07/29/18 2240  HGB  --   --  8.6*  --  8.3*  --  9.0*  --   --   HCT  --   --  27.9*  --  27.1*  --  29.5*  --   --   PLT 194  --  191  --  186  --  182  --   --   APTT 32  --  55*  --   --   --   --   --   --   LABPROT 20.8*  20.9*  --   --   --   --   --   --   --   --   INR 1.81  1.83  --   --   --   --   --   --   --   --   HEPARINUNFRC  --   --   --  0.10*  --  <0.10*  --   --  0.28*  CREATININE  --   --   --  2.75* 2.19*  --  1.90* 1.80*  --   CKTOTAL 608*  --   --   --   --   --   --   --   --   TROPONINI  --  38.76* 28.85*  --  24.43*  --   --   --   --     Assessment: 63yo male remains slightly subtherapeutic on heparin after rate change; no gtt issues or signs of bleeding per RN.  Goal of Therapy:  Heparin level 0.3-0.7 units/ml   Plan:  Will increase heparin gtt by 1-2 units/kg/hr to 1500 units/hr and check level in 8 hours.    Samuel Bates, PharmD, BCPS  07/30/2018,12:03 AM

## 2018-07-30 NOTE — Progress Notes (Signed)
Patient ID: Samuel Bates, male   DOB: 1955/11/20, 63 y.o.   MRN: [id number] [name] Progress Note   Assessment/ Plan:   1.  Acute kidney injury (possibly on chronic kidney disease): Secondary to cardiogenic shock with CRRT started for severe azotemia/uremic symptoms.  Overnight, with limitations to ultrafiltration after developing wide-complex tachycardia with some hemodynamic instability.  This morning, reattempt ultrafiltration with CRRT of net -100 cc/hour. 2.  Acute exacerbation of systolic heart failure with cardiogenic shock: Status post recent MI, continue supportive management with CRRT for assistance with volume unloading in the setting of acute kidney injury.  Elevated CVP noted.  He remains on Levophed, milrinone and amiodarone drip. 3.  Anemia: Secondary to chronic disease and iron deficiency, ongoing intravenous iron.  Status post 2 units PRBCs. 4.  Elevated transaminases: Secondary to shock liver from cardiogenic shock.  Monitor with management of underlying congestive heart failure. 5.  Hyponatremia: Secondary to acute kidney injury/CHF exacerbation, monitor with CRRT/ultrafiltration.  Limit water/hypotonic fluids.  Subjective:   With wide-complex tachycardia overnight limiting ultrafiltration.  He was asymptomatic during that event.   Objective:   BP 105/76   Pulse 86   Temp 97.7 F (36.5 C) (Oral)   Resp 14   Ht 6\' 3"  (1.905 m)   Wt 81.5 kg   SpO2 96%   BMI 22.46 kg/m   Intake/Output Summary (Last 24 hours) at 07/30/2018 0802 Last data filed at 07/30/2018 0700 Gross per 24 hour  Intake 2480.86 ml  Output 3921 ml  Net -1440.14 ml   Weight change: -8.312 kg  Physical Exam: Gen: Comfortably resting in bed, oxygen via nasal cannula CVS: Pulse regular rhythm, normal rate, S1 and S2 normal Resp: Anteriorly clear to auscultation, no distinct rales or rhonchi, elevated JVP Abd: Soft, flat, nontender Ext: No lower extremity edema  palpable  Imaging: Dg Chest 2 View  Result Date: 07/18/2018 CLINICAL DATA:  Shortness of breath for the past week with intermittent chest pain. EXAM: CHEST - 2 VIEW COMPARISON:  05/10/2006. FINDINGS: Stable enlarged cardiac silhouette. Interval band like area of patchy opacity in the right mid lung zone. Clear left lung. Stable mild diffuse prominence of the interstitial markings with normal vascularity. Decreased peribronchial thickening. Mild thoracolumbar spine degenerative changes. IMPRESSION: 1. Interval band like area of patchy opacity in the right mid lung zone. This could represent pneumonia, patchy atelectasis or small area of pulmonary infarction. 2. Stable cardiomegaly and mild chronic interstitial lung disease. Electronically Signed   By: Claudie Revering M.D.   On: 07/11/2018 14:36   US Renal  Result Date: 07/29/2018 CLINICAL DATA:  Acute kidney injury EXAM: RENAL / URINARY TRACT ULTRASOUND COMPLETE COMPARISON:  None. FINDINGS: Right Kidney: Renal measurements: 11.1 cm. Echogenicity mildly increased. No mass or hydronephrosis visualized. Upper pole cyst measures 1.6 x 1.3 x 1.5 cm. Left Kidney: Not visualized due to inability of patient to be positioned adequately. Bladder: Decompressed by Foley catheter. IMPRESSION: 1. Mildly echogenic right kidney, compatible with chronic medical renal disease. 2. Nonvisualization of the left kidney. Electronically Signed   By: Ulyses Jarred M.D.   On: 07/29/2018 03:55   Dg Chest Port 1 View  Result Date: 07/29/2018 CLINICAL DATA:  Encounter for central line placement. EXAM: PORTABLE CHEST 1 VIEW COMPARISON:  07/29/2018 FINDINGS: The there is been interval placement of a left IJ catheter with tip projecting over the SVC. No pneumothorax identified. Cardiac enlargement is stable. No pleural effusion or edema identified. No airspace opacities. IMPRESSION: 1.  No complications after left IJ catheter placement. The catheter tip projects over the SVC. 2. Cardiac  enlargement. Electronically Signed   By: Kerby Moors M.D.   On: 07/29/2018 13:27   Dg Chest Port 1 View  Result Date: 07/29/2018 CLINICAL DATA:  Respiratory failure EXAM: PORTABLE CHEST 1 VIEW COMPARISON:  Yesterday FINDINGS: Cardiomegaly. Mild atelectasis or scarring along the right minor fissure. Interstitial coarsening without Kerley lines or effusion. No pneumothorax. Aortic tortuosity. IMPRESSION: Cardiomegaly and vascular congestion. Electronically Signed   By: Monte Fantasia M.D.   On: 07/29/2018 06:53    Labs: BMET Recent Labs  Lab 07/12/2018 1431 07/29/18 0416 07/29/18 0653 07/29/18 1607 07/29/18 2008 07/30/18 0420  NA 128* 133* 134* 133* 131* 131*  K 5.0 4.8 4.6 4.9 4.7 4.4  CL 89* 96* 98 100 95* 96*  CO2 21* 23 24 23 22 22   GLUCOSE 154* 138* 126* 134* 128* 136*  BUN 198* 156* 121* 99* 88* 68*  CREATININE 3.91* 2.75* 2.19* 1.90* 1.80* 1.88*  CALCIUM 7.8* 7.6* 7.6* 7.6* 7.6* 7.7*  PHOS  --  6.3*  --  4.3  --  3.2   CBC Recent Labs  Lab 07/05/2018 1651 07/29/18 0150 07/29/18 0653 07/29/18 1607 07/30/18 0420  WBC  --  10.2 8.9 9.4 12.0*  NEUTROABS 78.6*  --   --   --   --   HGB  --  8.6* 8.3* 9.0* 9.2*  HCT  --  27.9* 27.1* 29.5* 30.5*  MCV  --  68.2* 68.1* 68.9* 69.6*  PLT 194 191 186 182 188    Medications:    . aspirin EC  81 mg Oral Daily  . Chlorhexidine Gluconate Cloth  6 each Topical Daily  . mouth rinse  15 mL Mouth Rinse BID  . pantoprazole (PROTONIX) IV  40 mg Intravenous BID  . sodium chloride flush  10-40 mL Intracatheter Q12H   Elmarie Shiley, MD 07/30/2018, 8:02 AM

## 2018-07-30 NOTE — Progress Notes (Addendum)
Advanced Heart Failure Rounding Note  PCP-Cardiologist: No primary care provider on file.   Subjective:    Coox 53% this am on milrinone 0.125 mcg/kg/min. Cr 1.88. Weight down 1 lb. CVP 16-17. Negative 1.4 L.   Remains on CRRT. Keeping even.   Had VT twice overnight. Around 1915 for about 1-2 minutes. Started on amio. Re-bolused for 8 minute run from 2300 - 2308.   Feeling OK this am. No SOB at rest, but has not been up with lines and gtts. No chest pain.   Echo 07/31/2018 LVEF 15%, with severe RV failure.   Objective:   Weight Range: 81.5 kg Body mass index is 22.46 kg/m.   Vital Signs:   Temp:  [96.5 F (35.8 C)-97.8 F (36.6 C)] 97.7 F (36.5 C) (01/27 0300) Pulse Rate:  [84-155] 88 (01/27 0700) Resp:  [9-23] 19 (01/27 0700) BP: (94-113)/(67-99) 105/76 (01/27 0700) SpO2:  [90 %-98 %] 96 % (01/27 0700) Arterial Line BP: (73-120)/(52-72) 99/60 (01/27 0700) Weight:  [81.5 kg] 81.5 kg (01/27 0430) Last BM Date: 07/13/2018  Weight change: Filed Weights   07/13/2018 2230 07/29/18 0530 07/30/18 0430  Weight: 82 kg 82 kg 81.5 kg   Intake/Output:   Intake/Output Summary (Last 24 hours) at 07/30/2018 0729 Last data filed at 07/30/2018 0700 Gross per 24 hour  Intake 2518.02 ml  Output 3959 ml  Net -1440.98 ml    Physical Exam    General:  NAD HEENT: Normal Neck: Supple. JVP to jaw. Carotids 2+ bilat; no bruits. No lymphadenopathy or thyromegaly appreciated. Cor: PMI nondisplaced. Regular rate & rhythm. No rubs, gallops or murmurs. Lungs: Diminished basilar sounds.  Abdomen: Soft, nontender, nondistended. No hepatosplenomegaly. No bruits or masses. Good bowel sounds. Extremities: No cyanosis, clubbing, rash, edema. R groin HD cath stable.  Neuro: Alert & orientedx3, cranial nerves grossly intact. moves all 4 extremities w/o difficulty. Affect pleasant  Telemetry   NSR 80-90s this am. 2 runs of VT overnight. ~1915 and from 2300 - 2308, Now on amiodarone. Personally  reviewed.   EKG    NSR 87 bpm this am, personally reviewed  Labs    CBC Recent Labs    07/24/2018 1651  07/29/18 1607 07/30/18 0420  WBC  --    < > 9.4 12.0*  NEUTROABS 78.6*  --   --   --   HGB  --    < > 9.0* 9.2*  HCT  --    < > 29.5* 30.5*  MCV  --    < > 68.9* 69.6*  PLT 194   < > 182 188   < > = values in this interval not displayed.   Basic Metabolic Panel Recent Labs    07/29/18 1607 07/29/18 2008 07/30/18 0420  NA 133* 131* 131*  K 4.9 4.7 4.4  CL 100 95* 96*  CO2 23 22 22   GLUCOSE 134* 128* 136*  BUN 99* 88* 68*  CREATININE 1.90* 1.80* 1.88*  CALCIUM 7.6* 7.6* 7.7*  MG  --  2.9* 2.9*  PHOS 4.3  --  3.2   Liver Function Tests Recent Labs    07/21/2018 1651  07/29/18 0653 07/29/18 1607 07/30/18 0420  AST 1,103*  --  756*  --   --   ALT 1,058*  --  945*  --   --   ALKPHOS 86  --  78  --   --   BILITOT 3.0*  --  3.4*  --   --  PROT 7.2  --  6.4*  --   --   ALBUMIN 3.6   < > 3.4* 3.2* 3.2*   < > = values in this interval not displayed.   No results for input(s): LIPASE, AMYLASE in the last 72 hours. Cardiac Enzymes Recent Labs    07/20/2018 1651 07/16/2018 2049 07/29/18 0150 07/29/18 0653  CKTOTAL 608*  --   --   --   TROPONINI  --  38.76* 28.85* 24.43*    BNP: BNP (last 3 results) Recent Labs    07/10/2018 1651  BNP 4,188.4*    ProBNP (last 3 results) No results for input(s): PROBNP in the last 8760 hours.   D-Dimer Recent Labs    07/25/2018 1651  DDIMER 12.38*   Hemoglobin A1C No results for input(s): HGBA1C in the last 72 hours. Fasting Lipid Panel Recent Labs    07/29/18 0416  CHOL 136  HDL 17*  LDLCALC 100*  TRIG 94  CHOLHDL 8.0   Thyroid Function Tests No results for input(s): TSH, T4TOTAL, T3FREE, THYROIDAB in the last 72 hours.  Invalid input(s): FREET3  Other results:   Imaging    Dg Chest Port 1 View  Result Date: 07/29/2018 CLINICAL DATA:  Encounter for central line placement. EXAM: PORTABLE CHEST 1 VIEW  COMPARISON:  07/29/2018 FINDINGS: The there is been interval placement of a left IJ catheter with tip projecting over the SVC. No pneumothorax identified. Cardiac enlargement is stable. No pleural effusion or edema identified. No airspace opacities. IMPRESSION: 1. No complications after left IJ catheter placement. The catheter tip projects over the SVC. 2. Cardiac enlargement. Electronically Signed   By: Kerby Moors M.D.   On: 07/29/2018 13:27      Medications:     Scheduled Medications: . aspirin EC  81 mg Oral Daily  . Chlorhexidine Gluconate Cloth  6 each Topical Daily  . mouth rinse  15 mL Mouth Rinse BID  . pantoprazole (PROTONIX) IV  40 mg Intravenous BID  . sodium chloride flush  10-40 mL Intracatheter Q12H     Infusions: .  prismasol BGK 4/2.5 400 mL/hr at 07/30/18 0124  .  prismasol BGK 4/2.5 200 mL/hr at 07/30/18 0127  . amiodarone 30 mg/hr (07/30/18 0700)  . amiodarone    . ceFEPime (MAXIPIME) IV Stopped (07/29/18 2244)  . ferumoxytol Stopped (07/29/18 1413)  . heparin 1,500 Units/hr (07/30/18 0700)  . milrinone 0.125 mcg/kg/min (07/30/18 0700)  . norepinephrine (LEVOPHED) Adult infusion 16 mcg/min (07/30/18 0700)  . prismasol bgk dialysis solution with potassium 1,800 mL/hr at 07/30/18 4403  . vancomycin Stopped (07/29/18 2159)     PRN Medications:  acetaminophen, alteplase, heparin, nitroGLYCERIN, ondansetron (ZOFRAN) IV, sodium chloride, sodium chloride flush   Patient Profile   62 y.o.malewith past medical history of hypertension, hyperlipidemia admitted with recent but late presenting inferior lateral myocardial infarction, cardiogenic shock, acute renal failure and microcytic anemia. Echocardiogram showed severe LV and RV dysfunction and moderate mitral regurgitation.  Assessment/Plan   1. Acute systolic HF with cardiogenic shock due to OOH MI - Echo 1/5 EF 15% severe RV dysfunction and moderate MR - Initial co-ox 28% but this was drawn femorally so  inaccurate.  - Coox 53% on milrinone 0.125 mcg/kg/min. Follow closely. Hesitant to increase with VT.  - CVP 16-17. Need to pull on CRRT. Remains on NE at 16. MAPs 80-90s - Continue volume removal with CRRT. Held even overnight after VT.  - No ACE/ARB/ARNI or b-blocker with shock and AKI  2. CAD - s/p OOH in inferolateral MI - Initial trop 28. Trended down.  - No current angina so no role for emergent cath - continue ASA and statin. No b-blocker with shock - Will need cath prior to d/c if renal function permits  3. AKI/ESRD with uremia - likely due to ATN and hypoperfusion. Initial BUN/CR 198/3.91 - Unclear baseline - Renal following CRT started on 1/25 via RFV cath - Renal u/s 1/26: Mildly echogenic right kidney, compatible with chronic medical renal disease. - Continue heparin. Watch closely for bleeding. Dosing per pharmD.   4. Microcytic anemia due to IDA - Hgb 9.2 today. MCV 69.6.  - Smear without schistocytes - Has received 2u RBCS - Iron stores low. Given Feraheme - Will eventually need GI w/u - Continue protonix  5. Shock liver - Improving with pressors.  6. VT - Noted x 2 overnight. Now on amiodarone gtt. - Follow closely while on milrinone. May need Lifevest.  Patient remains critically ill.   Medication concerns reviewed with patient and pharmacy team. Barriers identified: None at this time.   Length of Stay: 2  Annamaria Helling  07/30/2018, 7:29 AM  Advanced Heart Failure Team Pager [phone] (M-F; 7a - 4p)  Please contact [facility] for night-coverage after hours (4p -7a ) and weekends on [email or web address]  Agree with above.   Remains on milrinone, NE and CRT. Had 2 episodes of VT overnight after milrinone increased to 0.375 and CRT rate increased to -150. Milrinone now at 0.125. CRT - 50. CVP 16-17. Making minimal urine (5-10cc/hr). Much more alert.   On exam Lying flat in bed NAD JVP to ear LIJ TLC Cor RRR Lungs CTA Ab soft NT  Ext   Tr edema. Warm. RFV trialysis cath  He remains critically ill on dual pressors and CRT. Had 2 episodes of VT overnight and now on IV amio. Will keep milrinone at 0.125. Titrate NE as needed to keep SBP ~ 100 and co-ox 55% or greater. Will increase CRT to -100. Watch rhythm closely. He has completed his infarct with no evidence of ongoing angina so no role for emergent cath. Will need prior to d/c (particualrly with VT; renal function permitting.  Discussed at length with patient and family.    CRITICAL CARE Performed by: Glori Bickers  Total critical care time: 35 minutes  Critical care time was exclusive of separately billable procedures and treating other patients.  Critical care was necessary to treat or prevent imminent or life-threatening deterioration.  Critical care was time spent personally by me (independent of midlevel providers or residents) on the following activities: development of treatment plan with patient and/or surrogate as well as nursing, discussions with consultants, evaluation of patient's response to treatment, examination of patient, obtaining history from patient or surrogate, ordering and performing treatments and interventions, ordering and review of laboratory studies, ordering and review of radiographic studies, pulse oximetry and re-evaluation of patient's condition.  Glori Bickers, MD  1:00 PM

## 2018-07-30 NOTE — Progress Notes (Addendum)
NAME:  Samuel Bates, MRN:  [id number], DOB:  08-07-1955, LOS: 2 ADMISSION DATE:  07/10/2018, CONSULTATION DATE:  1/25 REFERRING MD: Gareth Morgan  , CHIEF COMPLAINT:  Shortness of breath x 5 days   Brief History   63 year old man presented 1/25 with confusion, shortness of breath on exertion, non productive cough for 5 days. Symptoms were proceeded by chest pain which radiated to his left arm and were associated with nausea and vomiting one week prior. EKG showed inferolateral ST elevations and Q waves and he was found to have a troponin that peaked at 28.  Cardiology evaluated and thought this was a late presentation infarct and given acute renal injury with lack of active chest pain the plan was for medical management.   Past Medical History  HTN  HLD  Colon polyps  Current daily cigar smoker   Significant Hospital Events   1/25 - admission   Consults:  PCCM Cards Renal  Procedures:  1/25 R femoral HD catheter  1/26 Radial artery catheter  1/26 IJ CVC   Significant Diagnostic Tests:  1/25 echocardiogram EF 10-15%, mild concentric hypertrophy, restrictive physiology, akinesis of the apical myocardium, moderated mitral regurgitation, moderately dilated left atrium, severely dilated right atrium, moderate tricuspid regurgitation, pulmonary artery pressure 45 mm Hg    1/25 renal US - echogenic right kidney consistent with medical renal disease, left kidney not visualized due to positioning   Micro Data:  1/25 - blood cultures - ngtd  MRSA neg   Antimicrobials:  Vanc 1/26>> Cefepime 1/25>>  Interim history/subjective:  Denies chest pain or difficulty breathing this morning. He does feel fatigued and reports that his mentation is foggy but appears oriented. This morning his temp was found 97 and he was shivering, he was placed on a bare hugger.   Objective   Blood pressure 105/76, pulse 88, temperature 97.7 F (36.5 C), temperature source Oral, resp. rate 19, height 6'  3" (1.905 m), weight 81.5 kg, SpO2 96 %. CVP:  [5 mmHg-27 mmHg] 17 mmHg      Intake/Output Summary (Last 24 hours) at 07/30/2018 0713 Last data filed at 07/30/2018 0700 Gross per 24 hour  Intake 2518.02 ml  Output 3959 ml  Net -1440.98 ml   Filed Weights   07/12/2018 2230 07/29/18 0530 07/30/18 0430  Weight: 82 kg 82 kg 81.5 kg    Examination: General: ill appearing, there is a bare hugger in place and CRRT is running  HENT: normocephalic, atraumatic, [place] in place  Lungs: course breath sounds at the bases, clear lung sounds over the anterior lung fields without wheezes or rhonchi  Cardiovascular: regular rate and rhythm, no murmur  Abdomen: soft, non tender, non distended  Extremities: no peripheral edema, extremities are warm  Neuro: awake, alert, oriented   Resolved Hospital Problem list   Patient had wide complex tachycardia, was bolused IV amiodarone and cardiology was contacted. This morning follow up EKG looks like sinus rhythm with a normal rate.   Heparin dose increased because levels were slightly subtherapeutic   Assessment & Plan:   Subacute inferior lateral myocardial infarction  Troponin peaked at 38 then trended down  - medical management - heparin, aspirin - holding off on statin given elevated transaminase  - holding off on beta blocker, ACE/ARB/ARNI for cardiogenic shock and renal failure  - continue to encourage cessation of tobacco use  - cardiology following, possible cath when renal function is improved   Cardiogenic shock  Systolic congestive heart failure  Elevated BNP  Unknown baseline cardiac function, no prior echo available.  - milrinone, levophed to keep MAP > 65 - ? Discontinue amiodarone  - net neg on dialysis   Vtach Resolved  - continue to follow electrolytes closely   Hypoxic respiratory failure  ? Pneumonia  Hypoxia likely secondary to cardiogenic shock. Abnormalities on CXR more consistent with pulmonary edema.  - titrate  supplemental oxygen to SpO2 94%  - afebrile, no leukocytosis, negative MRSA . I feel that it is unlikely that this is pneumonia, he has received 48 hours of vancomycin and cefepime, ? discontinue today   Acute kidney injury, non oliguric  Hyponatremia  Last renal function testing available is in care everywhere from 2014 when he had a creatinine of 1.6 and GFR 46. Renal ultrasound obtained this admission is consistent with chronic renal disease.  - creatinine improving with improvement in perfusion  - CRRT with net 2 L neg this admission  - nephrology following  - follow urine output and BMP   Elevated transaminase  Likely shock liver but unknown baseline. Ammonia was 21 when checked at the time of admission.  - continue to trend   Microcytic Anemia 2/2 GI bleed   Hx of colon adenomas  Reports dark tarry stools in the weeks leading up to admission. Last colonoscopy 04/2018 showed three hyperplastic polyps and two adenomas which were removed with a cold snare, diverticulosis, and Internal hemorrhoids. - tranfused 1 U PRBC yesterday, hemoglobin improved to 9 today  - transfuse for hgb < 8 given acute coronary syndrome - continue protonix 40 mg BID  - would consider EGD/ colonoscopy when stable   Hypocalcemia  Ca 7.9 when corrected for albumin. Normal Mag and phos.  - ? provide calcium carbonate  - monitor QT   HTN  - hold home amlodipine and hydralazine   Best practice:  Diet: heart health  Pain/Anxiety/Delirium protocol (if indicated): na VAP protocol (if indicated): na DVT prophylaxis: heparin gtt  GI prophylaxis: IV protonix 40 mg BID  Glucose control: CBG monitoring  Mobility: up as tolerated  Code Status: full  Family Communication: Patients girlfried was updated at bedside yesterday afternoon, patient states his daughter, son, cousin and girlfriend will be back later today and we will speak with them then.  Disposition: continue ICU level of care for CRRT and  pressors  Labs   CBC: Recent Labs  Lab 07/07/2018 1431 07/08/2018 1651 07/29/18 0150 07/29/18 0653 07/29/18 1607 07/30/18 0420  WBC 9.6  --  10.2 8.9 9.4 12.0*  NEUTROABS  --  78.6*  --   --   --   --   HGB 9.3*  --  8.6* 8.3* 9.0* 9.2*  HCT 30.2*  --  27.9* 27.1* 29.5* 30.5*  MCV 68.3*  --  68.2* 68.1* 68.9* 69.6*  PLT PLATELET CLUMPS NOTED ON SMEAR, COUNT APPEARS ADEQUATE 194 191 186 182 322    Basic Metabolic Panel: Recent Labs  Lab 07/29/18 0150 07/29/18 0416 07/29/18 0653 07/29/18 1607 07/29/18 2008 07/30/18 0420  NA  --  133* 134* 133* 131* 131*  K  --  4.8 4.6 4.9 4.7 4.4  CL  --  96* 98 100 95* 96*  CO2  --  23 24 23 22 22   GLUCOSE  --  138* 126* 134* 128* 136*  BUN  --  156* 121* 99* 88* 68*  CREATININE  --  2.75* 2.19* 1.90* 1.80* 1.88*  CALCIUM  --  7.6* 7.6* 7.6* 7.6* 7.7*  MG 3.4*  --   --   --  2.9* 2.9*  PHOS  --  6.3*  --  4.3  --  3.2   GFR: Estimated Creatinine Clearance: 47 mL/min (A) (by C-G formula based on SCr of 1.88 mg/dL (H)). Recent Labs  Lab 07/29/2018 1651 07/21/2018 1908 07/29/18 0150 07/29/18 0416 07/29/18 0653 07/29/18 1607 07/30/18 0420  WBC  --   --  10.2  --  8.9 9.4 12.0*  LATICACIDVEN 2.2* 2.3*  --  2.1*  --   --   --     Liver Function Tests: Recent Labs  Lab 07/29/2018 1651 07/29/18 0416 07/29/18 0653 07/29/18 1607 07/30/18 0420  AST 1,103*  --  756*  --   --   ALT 1,058*  --  945*  --   --   ALKPHOS 86  --  78  --   --   BILITOT 3.0*  --  3.4*  --   --   PROT 7.2  --  6.4*  --   --   ALBUMIN 3.6 3.4* 3.4* 3.2* 3.2*   No results for input(s): LIPASE, AMYLASE in the last 168 hours. Recent Labs  Lab 07/26/2018 1908  AMMONIA 21    ABG    Component Value Date/Time   O2SAT 53.0 07/30/2018 0420     Coagulation Profile: Recent Labs  Lab 08/02/2018 1651  INR 1.81  1.83    Cardiac Enzymes: Recent Labs  Lab 07/31/2018 1651 07/06/2018 2049 07/29/18 0150 07/29/18 0653  CKTOTAL 608*  --   --   --   TROPONINI  --   38.76* 28.85* 24.43*    HbA1C: No results found for: HGBA1C  CBG: Recent Labs  Lab 07/18/2018 1532  GLUCAP 148*    Review of Systems:   Denies nausea, chest pain or shortness of breath now or overnight when he was in vtach.   Past Medical History  He,  has a past medical history of Allergy, adenomatous colonic polyps (03/07/2017), Hyperlipidemia, and Hypertension.   Surgical History    Past Surgical History:  Procedure Laterality Date  . COLONOSCOPY    . POLYPECTOMY    . TONSILLECTOMY     age 67     Social History   reports that he has been smoking cigars. He has never used smokeless tobacco. He reports current alcohol use of about 2.0 standard drinks of alcohol per week. He reports current drug use. Drug: Marijuana.   Family History   His family history includes COPD in his father and sister; Colon polyps in his brother; Stroke in his brother. There is no history of Colon cancer, Esophageal cancer, Rectal cancer, Stomach cancer, Diabetes, or Colitis.   Allergies Allergies  Allergen Reactions  . Penicillins Hives    Did it involve swelling of the face/tongue/throat, SOB, or low BP? YES Did it involve sudden or severe rash/hives, skin peeling, or any reaction on the inside of your mouth or nose? NO Did you need to seek medical attention at a hospital or doctor's office? YES When did it last happen? 30 years ago If all above answers are "NO", may proceed with cephalosporin use.  . Simvastatin Other (See Comments)    insomnia     Home Medications  Prior to Admission medications   Medication Sig Start Date End Date Taking? Authorizing Provider  amLODipine (NORVASC) 10 MG tablet Take 10 mg by mouth daily.   Yes [provider]  atorvastatin (LIPITOR) 40 MG tablet Take  40 mg by mouth daily.   Yes [provider]  Cetirizine HCl 10 MG CAPS Take 1 capsule by mouth as needed (allergies).    Yes [provider]  hydrochlorothiazide  (HYDRODIURIL) 25 MG tablet Take 25 mg by mouth daily.   Yes [provider]  losartan (COZAAR) 50 MG tablet Take 50 mg by mouth daily.   Yes [provider]  omega-3 fish oil (MAXEPA) 1000 MG CAPS capsule Take 2 capsules by mouth daily.   Yes [provider]    Attending note:  63 year old male with extensive cardiac history who presents to PCCM with cardiogenic shock, renal failure and hypoxemia.  Amio started overnight for wide complex tachycardia, no new complaints.  On exam, bibasilar crackles noted.  I reviewed CXR myself, pulmonary edema noted.  Discussed with PCCM-NP.  Continue heparin.  Continue milrinone and norepi for BP support.  Replace electrolytes as indicated.  CRRT per renal.  Appreciate input from cardiology.  Continue volume negative.  Titrate O2 for sat of 88-92%.  D/c abx and monitor clinically.  The patient is critically ill with multiple organ systems failure and requires high complexity decision making for assessment and support, frequent evaluation and titration of therapies, application of advanced monitoring technologies and extensive interpretation of multiple databases.   Critical Care Time devoted to patient care services described in this note is  32  Minutes. This time reflects time of care of this signee Dr Jennet Maduro. This critical care time does not reflect procedure time, or teaching time or supervisory time of PA/NP/Med student/Med Resident etc but could involve care discussion time.  Rush Farmer, M.D. [facility] Pulmonary/Critical Care Medicine. Pager: [phone]. After hours pager: [phone].

## 2018-07-30 NOTE — Progress Notes (Signed)
ANTICOAGULATION CONSULT NOTE - [place] for heparin Indication: Chest pain/ACS  Allergies  Allergen Reactions  . Penicillins Hives    Did it involve swelling of the face/tongue/throat, SOB, or low BP? YES Did it involve sudden or severe rash/hives, skin peeling, or any reaction on the inside of your mouth or nose? NO Did you need to seek medical attention at a hospital or doctor's office? YES When did it last happen? 30 years ago If all above answers are "NO", may proceed with cephalosporin use.  . Simvastatin Other (See Comments)    insomnia    Patient Measurements: Height: 6\' 3"  (190.5 cm) Weight: 179 lb 10.8 oz (81.5 kg) IBW/kg (Calculated) : 84.5 Heparin Dosing Weight: 89.8 kg  Vital Signs: Temp: 97.7 F (36.5 C) (01/27 0300) Temp Source: Oral (01/27 0300) BP: 102/81 (01/27 0800) Pulse Rate: 88 (01/27 0830)  Labs: Recent Labs    07/10/2018 1651 07/26/2018 2049 07/29/18 0150  07/29/18 0653 07/29/18 1428 07/29/18 1607 07/29/18 2008 07/29/18 2240 07/30/18 0420 07/30/18 0735  HGB  --   --  8.6*  --  8.3*  --  9.0*  --   --  9.2*  --   HCT  --   --  27.9*  --  27.1*  --  29.5*  --   --  30.5*  --   PLT 194  --  191  --  186  --  182  --   --  188  --   APTT 32  --  55*  --   --   --   --   --   --  85*  --   LABPROT 20.8*  20.9*  --   --   --   --   --   --   --   --   --   --   INR 1.81  1.83  --   --   --   --   --   --   --   --   --   --   HEPARINUNFRC  --   --   --    < >  --  <0.10*  --   --  0.28*  --  0.56  CREATININE  --   --   --    < > 2.19*  --  1.90* 1.80*  --  1.88*  --   CKTOTAL 608*  --   --   --   --   --   --   --   --   --   --   TROPONINI  --  38.76* 28.85*  --  24.43*  --   --   --   --   --   --    < > = values in this interval not displayed.    Estimated Creatinine Clearance: 47 mL/min (A) (by C-G formula based on SCr of 1.88 mg/dL (H)).   Medical History: Past Medical History:  Diagnosis Date  .  Allergy   . Hx of adenomatous colonic polyps 03/07/2017  . Hyperlipidemia   . Hypertension    Assessment: 63 yo male admitted with chest pain and r/o NSTEMI/ACS. MD wishes to start heparin drip. No anticoagulation noted PTA.    Heparin level therapeutic this morning, H/H low but stable.  Goal of Therapy:  Heparin level 0.3-0.7 units/ml Monitor platelets by anticoagulation protocol: Yes   Plan:  -Continue heparin 1500 units/hr -Daily heparin level  and CBC  Arrie Senate, PharmD, BCPS Clinical Pharmacist [phone] Please check AMION for all [place] numbers 07/30/2018

## 2018-07-31 DIAGNOSIS — J810 Acute pulmonary edema: Secondary | ICD-10-CM

## 2018-07-31 DIAGNOSIS — J9601 Acute respiratory failure with hypoxia: Secondary | ICD-10-CM

## 2018-07-31 LAB — RENAL FUNCTION PANEL
Albumin: 3.1 g/dL — ABNORMAL LOW (ref 3.5–5.0)
Albumin: 3.1 g/dL — ABNORMAL LOW (ref 3.5–5.0)
Anion gap: 12 (ref 5–15)
Anion gap: 12 (ref 5–15)
BUN: 33 mg/dL — ABNORMAL HIGH (ref 8–23)
BUN: 29 mg/dL — AB (ref 8–23)
CO2: 24 mmol/L (ref 22–32)
CO2: 23 mmol/L (ref 22–32)
CREATININE: 2.04 mg/dL — AB (ref 0.61–1.24)
Calcium: 7.9 mg/dL — ABNORMAL LOW (ref 8.9–10.3)
Calcium: 8 mg/dL — ABNORMAL LOW (ref 8.9–10.3)
Chloride: 96 mmol/L — ABNORMAL LOW (ref 98–111)
Chloride: 96 mmol/L — ABNORMAL LOW (ref 98–111)
Creatinine, Ser: 1.99 mg/dL — ABNORMAL HIGH (ref 0.61–1.24)
GFR calc Af Amer: 41 mL/min — ABNORMAL LOW (ref >60)
GFR calc Af Amer: 39 mL/min — ABNORMAL LOW (ref >60)
GFR calc non Af Amer: 35 mL/min — ABNORMAL LOW (ref >60)
GFR calc non Af Amer: 34 mL/min — ABNORMAL LOW (ref >60)
Glucose, Bld: 116 mg/dL — ABNORMAL HIGH (ref 70–99)
Glucose, Bld: 127 mg/dL — ABNORMAL HIGH (ref 70–99)
Phosphorus: 2.7 mg/dL (ref 2.5–4.6)
Phosphorus: 2.6 mg/dL (ref 2.5–4.6)
Potassium: 4.2 mmol/L (ref 3.5–5.1)
Potassium: 4.3 mmol/L (ref 3.5–5.1)
Sodium: 132 mmol/L — ABNORMAL LOW (ref 135–145)
Sodium: 131 mmol/L — ABNORMAL LOW (ref 135–145)

## 2018-07-31 LAB — HEPATIC FUNCTION PANEL
ALK PHOS: 79 U/L (ref 38–126)
ALT: 640 U/L — ABNORMAL HIGH (ref 0–44)
AST: 187 U/L — ABNORMAL HIGH (ref 15–41)
Albumin: 3.1 g/dL — ABNORMAL LOW (ref 3.5–5.0)
Bilirubin, Direct: 0.9 mg/dL — ABNORMAL HIGH (ref 0.0–0.2)
Indirect Bilirubin: 1.8 mg/dL — ABNORMAL HIGH (ref 0.3–0.9)
Total Bilirubin: 2.7 mg/dL — ABNORMAL HIGH (ref 0.3–1.2)
Total Protein: 6.5 g/dL (ref 6.5–8.1)

## 2018-07-31 LAB — CBC
HCT: 29 % — ABNORMAL LOW (ref 39.0–52.0)
Hemoglobin: 9 g/dL — ABNORMAL LOW (ref 13.0–17.0)
MCH: 21.7 pg — ABNORMAL LOW (ref 26.0–34.0)
MCHC: 31 g/dL (ref 30.0–36.0)
MCV: 70 fL — ABNORMAL LOW (ref 80.0–100.0)
Platelets: 184 10*3/uL (ref 150–400)
RBC: 4.14 MIL/uL — ABNORMAL LOW (ref 4.22–5.81)
RDW: 23 % — ABNORMAL HIGH (ref 11.5–15.5)
WBC: 11.8 10*3/uL — ABNORMAL HIGH (ref 4.0–10.5)
nRBC: 70.1 % — ABNORMAL HIGH (ref 0.0–0.2)

## 2018-07-31 LAB — COOXEMETRY PANEL
CARBOXYHEMOGLOBIN: 1.5 % (ref 0.5–1.5)
CARBOXYHEMOGLOBIN: 1.4 % (ref 0.5–1.5)
Carboxyhemoglobin: 2.3 % — ABNORMAL HIGH (ref 0.5–1.5)
Methemoglobin: 1.4 % (ref 0.0–1.5)
Methemoglobin: 0.8 % (ref 0.0–1.5)
Methemoglobin: 1.8 % — ABNORMAL HIGH (ref 0.0–1.5)
O2 Saturation: 51.5 %
O2 Saturation: 95.2 %
O2 Saturation: 48.2 %
Total hemoglobin: 9.3 g/dL — ABNORMAL LOW (ref 12.0–16.0)
Total hemoglobin: 9.4 g/dL — ABNORMAL LOW (ref 12.0–16.0)
Total hemoglobin: 9.4 g/dL — ABNORMAL LOW (ref 12.0–16.0)

## 2018-07-31 LAB — BASIC METABOLIC PANEL
Anion gap: 12 (ref 5–15)
BUN: 34 mg/dL — ABNORMAL HIGH (ref 8–23)
CO2: 24 mmol/L (ref 22–32)
Calcium: 7.9 mg/dL — ABNORMAL LOW (ref 8.9–10.3)
Chloride: 96 mmol/L — ABNORMAL LOW (ref 98–111)
Creatinine, Ser: 2.03 mg/dL — ABNORMAL HIGH (ref 0.61–1.24)
GFR calc Af Amer: 40 mL/min — ABNORMAL LOW (ref >60)
GFR calc non Af Amer: 34 mL/min — ABNORMAL LOW (ref >60)
Glucose, Bld: 116 mg/dL — ABNORMAL HIGH (ref 70–99)
Potassium: 4.2 mmol/L (ref 3.5–5.1)
SODIUM: 132 mmol/L — AB (ref 135–145)

## 2018-07-31 LAB — APTT: aPTT: 81 seconds — ABNORMAL HIGH (ref 24–36)

## 2018-07-31 LAB — PHOSPHORUS: Phosphorus: 2.7 mg/dL (ref 2.5–4.6)

## 2018-07-31 LAB — HEPARIN LEVEL (UNFRACTIONATED): Heparin Unfractionated: 0.37 IU/mL (ref 0.30–0.70)

## 2018-07-31 LAB — PATHOLOGIST SMEAR REVIEW

## 2018-07-31 LAB — CALCIUM, IONIZED: Calcium, Ionized, Serum: 4.3 mg/dL — ABNORMAL LOW (ref 4.5–5.6)

## 2018-07-31 LAB — MAGNESIUM: MAGNESIUM: 2.8 mg/dL — AB (ref 1.7–2.4)

## 2018-07-31 MED ORDER — ASPIRIN 81 MG PO CHEW
81.0000 mg | CHEWABLE_TABLET | ORAL | Status: AC
  Administered 2018-08-01: 81 mg via ORAL
  Filled 2018-07-31: qty 1

## 2018-07-31 MED ORDER — SODIUM CHLORIDE 0.9% FLUSH
3.0000 mL | Freq: Two times a day (BID) | INTRAVENOUS | Status: DC
  Administered 2018-07-31: 3 mL via INTRAVENOUS

## 2018-07-31 MED ORDER — SODIUM CHLORIDE 0.9 % IV SOLN: INTRAVENOUS | Status: DC

## 2018-07-31 MED ORDER — SODIUM CHLORIDE 0.9% FLUSH: 3.0000 mL | INTRAVENOUS | Status: DC | PRN

## 2018-07-31 MED ORDER — AMIODARONE IV BOLUS ONLY 150 MG/100ML: 150.0000 mg | Freq: Once | INTRAVENOUS | Status: DC

## 2018-07-31 MED ORDER — SODIUM CHLORIDE 0.9 % IV SOLN: 250.0000 mL | INTRAVENOUS | Status: DC | PRN

## 2018-07-31 MED ORDER — SODIUM BICARBONATE 8.4 % IV SOLN
INTRAVENOUS | Status: AC
  Filled 2018-07-31: qty 100

## 2018-07-31 NOTE — Care Management Note (Signed)
Case Management Note  Patient Details  Name: Samuel Bates MRN: [id number] Date of Birth: 1955-11-27  Subjective/Objective:  63 yo male presented with confusion, SOB and a non-productive cough.                  Action/Plan: CM following for dispositional needs. Patient remains on CRRT and will need cath prior to transitioning home once renal function improved. May need a Armed forces training and education officer, with requested documents provided to [employer] rep. CM team will continue to follow.   Expected Discharge Date:                  Expected Discharge Plan:  Home/Self Care  In-House Referral:  NA  Discharge planning Services  CM Consult  Post Acute Care Choice:  NA Choice offered to:  NA  DME Arranged:  Life vest DME Agency:  [facility]  HH Arranged:  NA HH Agency:  NA  Status of Service:  In process, will continue to follow  If discussed at Long Length of Stay Meetings, dates discussed:    Additional Comments:  Midge Minium RN, BSN, NCM-BC, ACM-RN [phone] 07/31/2018, 4:54 PM

## 2018-07-31 NOTE — Progress Notes (Signed)
Spoke with Dr. Delsa Sale, received verbal orders to notify if Vtach sustain >15 seconds and if pt is symptomatic. Decreased fluid removal rate to 167mls/hr on CRRT. Will continue to monitor closely.

## 2018-07-31 NOTE — Progress Notes (Addendum)
Advanced Heart Failure Rounding Note  PCP-Cardiologist: No primary care provider on file.   Subjective:    1/26 Had VT twice ~1915 for 1-2 minutes. Started on amio. Re-bolused due to 8 minute run ~2300   Coox 51.5% this am on milrinone 0.125 mcg/kg/min. Cr 1.88 -> 2.03. Weight down 2 lbs. CVP 14-16 cm  Remains on NE 12 and Amiodarone 30 mg/hr.   Remains on CRRT. Goal -100 cc/hr.   Feeling OK this am. Denies CP, SOB at rest, or lightheadedness or dizziness on CRRT. He also denies palpitations despite frequent PVCs/NSVT.   Echo 08/03/2018 LVEF 15%, with severe RV failure.   Objective:   Weight Range: 80.5 kg Body mass index is 22.18 kg/m.   Vital Signs:   Temp:  [97.3 F (36.3 C)-98.2 F (36.8 C)] 97.5 F (36.4 C) (01/28 0741) Pulse Rate:  [82-113] 85 (01/28 0730) Resp:  [11-25] 11 (01/28 0730) BP: (96-106)/(71-84) 102/80 (01/27 1900) SpO2:  [89 %-100 %] 97 % (01/28 0730) Arterial Line BP: (74-119)/(54-73) 107/73 (01/28 0730) Weight:  [80.5 kg] 80.5 kg (01/28 0600) Last BM Date: 07/30/18  Weight change: Filed Weights   07/29/18 0530 07/30/18 0430 07/31/18 0600  Weight: 82 kg 81.5 kg 80.5 kg   Intake/Output:   Intake/Output Summary (Last 24 hours) at 07/31/2018 0745 Last data filed at 07/31/2018 0700 Gross per 24 hour  Intake 1403.81 ml  Output 3834 ml  Net -2430.19 ml    Physical Exam    General: NAD HEENT: Normal Neck: Supple. JVP to jaw. Carotids 2+ bilat; no bruits. No thyromegaly or nodule noted. Cor: PMI nondisplaced. RRR, No M/G/R noted Lungs: Clear anteriorly, dependent crackles.  Abdomen: Soft, non-tender, non-distended, no HSM. No bruits or masses. +BS  Extremities: No cyanosis, clubbing, or rash. R groin HD cath stable.  Neuro: Alert & orientedx3, cranial nerves grossly intact. moves all 4 extremities w/o difficulty. Affect pleasant   Telemetry   NSR 80-90s this am. 2 runs of VT overnight. ~1915 and from 2300 - 2308, Now on amiodarone.  Personally reviewed.   EKG    No new tracings.    Labs    CBC Recent Labs    07/07/2018 1651  07/30/18 0420 07/31/18 0500  WBC  --    < > 12.0* 11.8*  NEUTROABS 78.6*  --   --   --   HGB  --    < > 9.2* 9.0*  HCT  --    < > 30.5* 29.0*  MCV  --    < > 69.6* 70.0*  PLT 194   < > 188 184   < > = values in this interval not displayed.   Basic Metabolic Panel Recent Labs    07/30/18 0420 07/30/18 1537 07/31/18 0500  NA 131* 131* 132*  K 4.4 4.2 4.2  CL 96* 96* 96*  CO2 22 21* 24  GLUCOSE 136* 127* 116*  BUN 68* 48* 34*  CREATININE 1.88* 1.93* 2.03*  CALCIUM 7.7* 7.7* 7.9*  MG 2.9*  --  2.8*  PHOS 3.2 3.1 2.7   Liver Function Tests Recent Labs    07/30/18 0735 07/30/18 1537 07/31/18 0500  AST 284*  --  187*  ALT 767*  --  640*  ALKPHOS 78  --  79  BILITOT 3.0*  --  2.7*  PROT 6.7  --  6.5  ALBUMIN 3.1* 3.2* 3.1*   No results for input(s): LIPASE, AMYLASE in the last 72 hours. Cardiac Enzymes Recent  Labs    07/16/2018 1651 07/14/2018 2049 07/29/18 0150 07/29/18 0653  CKTOTAL 608*  --   --   --   TROPONINI  --  38.76* 28.85* 24.43*    BNP: BNP (last 3 results) Recent Labs    07/12/2018 1651  BNP 4,188.4*    ProBNP (last 3 results) No results for input(s): PROBNP in the last 8760 hours.   D-Dimer Recent Labs    07/27/2018 1651  DDIMER 12.38*   Hemoglobin A1C No results for input(s): HGBA1C in the last 72 hours. Fasting Lipid Panel Recent Labs    07/29/18 0416  CHOL 136  HDL 17*  LDLCALC 100*  TRIG 94  CHOLHDL 8.0   Thyroid Function Tests No results for input(s): TSH, T4TOTAL, T3FREE, THYROIDAB in the last 72 hours.  Invalid input(s): FREET3  Other results:   Imaging    No results found.   Medications:     Scheduled Medications: . aspirin EC  81 mg Oral Daily  . Chlorhexidine Gluconate Cloth  6 each Topical Daily  . mouth rinse  15 mL Mouth Rinse BID  . pantoprazole (PROTONIX) IV  40 mg Intravenous BID  . sodium chloride  flush  10-40 mL Intracatheter Q12H    Infusions: .  prismasol BGK 4/2.5 400 mL/hr at 07/31/18 0314  .  prismasol BGK 4/2.5 200 mL/hr at 07/31/18 0315  . amiodarone 30 mg/hr (07/31/18 0700)  . ferumoxytol Stopped (07/29/18 1413)  . heparin 1,500 Units/hr (07/31/18 0700)  . milrinone 0.125 mcg/kg/min (07/31/18 0700)  . norepinephrine (LEVOPHED) Adult infusion 12 mcg/min (07/31/18 0700)  . prismasol bgk dialysis solution with potassium 1,800 mL/hr at 07/31/18 0603    PRN Medications: acetaminophen, alteplase, heparin, nitroGLYCERIN, ondansetron (ZOFRAN) IV, sodium chloride, sodium chloride flush   Patient Profile   63 y.o.malewith past medical history of hypertension, hyperlipidemia admitted with recent but late presenting inferior lateral myocardial infarction, cardiogenic shock, acute renal failure and microcytic anemia. Echocardiogram showed severe LV and RV dysfunction and moderate mitral regurgitation.  Assessment/Plan   1. Acute systolic HF with cardiogenic shock due to OOH MI - Echo 1/5 EF 15% severe RV dysfunction and moderate MR - Initial co-ox 28% but this was drawn femorally so inaccurate.  - Coox 51.5% on milrinone 0.125 mcg/kg/min. Will recheck and follow closely. Hesitant to increase with VT.  - CVP 14-15. CRRT pulling -100 cc/hr currently.  - Remains on NE 12. MAPS 70s  - Continue volume removal with CRRT. Held even overnight after VT.  - No ACE/ARB/ARNI or b-blocker with shock and AKI  2. CAD - s/p OOH in inferolateral MI - Initial trop 28. Trended down.  - No current angina so no role for emergent cath - Continue ASA and statin. No b-blocker with shock - Will need cath prior to discharge, especially with VT.   3. AKI/ESRD with uremia - likely due to ATN and hypoperfusion. Initial BUN/CR 198/3.91 - Unclear baseline.  - Renal following CRT started on 1/25 via RFV cath. Goal -100 cc/hr now.  - Renal u/s 1/26: Mildly echogenic right kidney, compatible with  chronic medical renal disease. - Continue heparin. Watch closely for bleeding. Dosing per PharmD.   4. Microcytic anemia due to IDA - Hgb 9.0 today. MCV 69.6.  - Smear without schistocytes - Has received 2u RBCS - Iron stores low. Given Feraheme - Will eventually need GI w/u - Continue protonix  5. Shock liver - Improving with hemodynamic support.   6. VT - Noted x  2 1/26. Now on amiodarone gtt. - Follow closely while on milrinone. May need Lifevest.  Patient remains critically ill. Continue to try and pull fluid. Watching closely for recurrent VT.   Medication concerns reviewed with patient and pharmacy team. Barriers identified: None at this time.   Length of Stay: 3  Annamaria Helling  07/31/2018, 7:45 AM  Advanced Heart Failure Team Pager [phone] (M-F; 7a - 4p)  Please contact [facility] for night-coverage after hours (4p -7a ) and weekends on [email or web address]  Agree with above.   Remains anuric. On CRRT. Requiring dual pressors with milrinone 0.125 and NE 12. Co-ox still low at 51%. CVP high but coming down slowly. CRT @ -100/hr. Not pulling faster due to previous VT at higher rates. VT stable on IV amio. No CP.   On exam Lying in bed. NAD JVP to jaw Cor RRR  Lungs CTA Ab soft NT Ext warm trace edema RFV trialysis   Remains pressor dependent and anuric. Volume status improving with CRT. Will continue at -100. Cont inotropes to keep SBP > 100 and co-ox as close to 55% or greater as possible. May need to try and increase milrinone. Watch for recurrent VT. Continue IV amio for now. Ideally would like to move trialysis cath to [facility] but given difficulty CCM team had with IJ access may be best to do under fluoro in cath lab tomorrow. Will d/w Renal .  CRITICAL CARE Performed by: Glori Bickers  Total critical care time: 35 minutes  Critical care time was exclusive of separately billable procedures and treating other patients.  Critical care was  necessary to treat or prevent imminent or life-threatening deterioration.  Critical care was time spent personally by me (independent of midlevel providers or residents) on the following activities: development of treatment plan with patient and/or surrogate as well as nursing, discussions with consultants, evaluation of patient's response to treatment, examination of patient, obtaining history from patient or surrogate, ordering and performing treatments and interventions, ordering and review of laboratory studies, ordering and review of radiographic studies, pulse oximetry and re-evaluation of patient's condition.  Glori Bickers, MD  8:47 AM

## 2018-07-31 NOTE — Progress Notes (Addendum)
NAME:  Samuel Bates, MRN:  [id number], DOB:  Jul 18, 1955, LOS: 2 ADMISSION DATE:  07/04/2018, CONSULTATION DATE:1/25 REFERRING VP:XTGG Schlossman, CHIEF COMPLAINT:Shortness of breath x 5 days  Brief History   63 year old man presented 1/25 with confusion, shortness of breath on exertion, non productive cough for 5 days. Symptoms were proceeded by chest pain which radiated to his left arm and were associated with nausea and vomiting one week prior. EKG showed inferolateral ST elevations and Q waves and he was found to have a troponin that peaked at 28.  Cardiology evaluated and thought this was a late presentation infarct and given acute renal injury with lack of active chest pain the plan was for medical management.   Past Medical History  HTN  HLD  Colon polyps  Current daily cigar smoker   Significant Hospital Events   1/25 - admission   Consults:  PCCM Cards Renal  Procedures:  1/25 R femoral HD catheter  1/26 Radial artery catheter  1/26 IJ CVC   Significant Diagnostic Tests:  1/25 echocardiogram EF 10-15%, mild concentric hypertrophy, restrictive physiology, akinesis of the apical myocardium, moderated mitral regurgitation, moderately dilated left atrium, severely dilated right atrium, moderate tricuspid regurgitation, pulmonary artery pressure 45 mm Hg    1/25 renal US - echogenic right kidney consistent with medical renal disease, left kidney not visualized due to positioning   Micro Data:  1/25 - blood cultures - ngtd  MRSA neg   Antimicrobials:  Vanc 1/26>> Cefepime 1/25>>  Interim history/subjective:   No acute overnight events.  Patient is feeling better today then he did yesterday, still denying any new symptoms such as chest pain or shortness of breath   Objective   Blood pressure 105/76, pulse 88, temperature 97.7 F (36.5 C), temperature source Oral, resp. rate 19, height 6\' 3"  (1.905 m), weight 81.5 kg, SpO2 96 %. CVP:  [5 mmHg-27  mmHg] 17 mmHg      Intake/Output Summary (Last 24 hours) at 07/30/2018 2694 Last data filed at 07/30/2018 0700    Gross per 24 hour  Intake 2518.02 ml  Output 3959 ml  Net -1440.98 ml        Filed Weights   07/27/2018 2230 07/29/18 0530 07/30/18 0430  Weight: 82 kg 82 kg 81.5 kg    Examination:  General: comfortable appearing, there is a bare hugger in place and CRRT is running  HENT: normocephalic, atraumatic, [place] in place  Lungs: course breath sounds at the bases, clear lung sounds over the anterior lung fields without wheezes or rhonchi  Cardiovascular: regular rate and rhythm, no murmur  Abdomen: soft, non tender, non distended  Extremities: no peripheral edema, extremities are warm  Neuro: awake, alert, oriented   Resolved Hospital Problem list     Assessment & Plan:   Subacute inferior lateral myocardial infarction  Troponin peaked at 38 then trended down  - medical management - heparin, aspirin - holding off on statin given elevated transaminase  - holding off on beta blocker, ACE/ARB/ARNI for cardiogenic shock and renal failure  - cardiology following, possible cath when renal function is improved   Cardiogenic shock  Systolic congestive heart failure  Elevated BNP  Unknown baseline cardiac function, no prior echo available.  - milrinone, levophed to keep MAP > 65 - net neg 2.4 L on dialysis   Vtach - amio  - continue to follow electrolytes closely   Hypoxic respiratory failure  Hypoxia likely secondary to cardiogenic shock. Abnormalities on CXR consistent  with pulmonary edema.  - wean supplemental oxygen to SpO2 94%   Acute kidney injury, non oliguric  Hyponatremia  Last renal function testing available is in care everywhere from 2014 when he had a creatinine of 1.6 and GFR 46. Renal ultrasound obtained this admission is consistent with chronic renal disease.  - CRRT with net 4 L neg this admission  - nephrology following  - follow urine  output and BMP   Elevated transaminase  Likely shock liver but unknown baseline.   - continue to trend   Microcytic Anemia 2/2 GI bleed   Hx of colon adenomas  Reports dark tarry stools in the weeks leading up to admission. Last colonoscopy 04/2018 showed three hyperplastic polyps and two adenomas which were removed with a cold snare, diverticulosis, and Internal hemorrhoids. - tranfused 1 U PRBC yesterday, hemoglobin improved to 9 today  - transfuse for hgb < 8 given acute coronary syndrome - continue protonix 40 mg BID  - would consider EGD/ colonoscopy when stable   Hypocalcemia  Ca 8.2 when corrected for albumin. Normal Mag and phos.  - monitor QT   HTN  - hold home amlodipine and hydralazine   Best practice:  Diet: heart health  Pain/Anxiety/Delirium protocol (if indicated): na VAP protocol (if indicated): na DVT prophylaxis: heparin gtt  GI prophylaxis: IV protonix 40 mg BID  Glucose control: CBG monitoring  Mobility: up as tolerated  Code Status: full  Family Communication: no family at bedside  Disposition: continue ICU level of care  Labs   CBC: LastLabs          Recent Labs  Lab 07/15/2018 1431 07/14/2018 1651 07/29/18 0150 07/29/18 0653 07/29/18 1607 07/30/18 0420  WBC 9.6  --  10.2 8.9 9.4 12.0*  NEUTROABS  --  78.6*  --   --   --   --   HGB 9.3*  --  8.6* 8.3* 9.0* 9.2*  HCT 30.2*  --  27.9* 27.1* 29.5* 30.5*  MCV 68.3*  --  68.2* 68.1* 68.9* 69.6*  PLT PLATELET CLUMPS NOTED ON SMEAR, COUNT APPEARS ADEQUATE 194 191 186 182 188      Basic Metabolic Panel: LastLabs          Recent Labs  Lab 07/29/18 0150 07/29/18 0416 07/29/18 0653 07/29/18 1607 07/29/18 2008 07/30/18 0420  NA  --  133* 134* 133* 131* 131*  K  --  4.8 4.6 4.9 4.7 4.4  CL  --  96* 98 100 95* 96*  CO2  --  23 24 23 22 22   GLUCOSE  --  138* 126* 134* 128* 136*  BUN  --  156* 121* 99* 88* 68*  CREATININE  --  2.75* 2.19* 1.90* 1.80* 1.88*  CALCIUM  --  7.6* 7.6*  7.6* 7.6* 7.7*  MG 3.4*  --   --   --  2.9* 2.9*  PHOS  --  6.3*  --  4.3  --  3.2     GFR: Estimated Creatinine Clearance: 47 mL/min (A) (by C-G formula based on SCr of 1.88 mg/dL (H)). LastLabs           Recent Labs  Lab 07/08/2018 1651 07/19/2018 1908 07/29/18 0150 07/29/18 0416 07/29/18 0653 07/29/18 1607 07/30/18 0420  WBC  --   --  10.2  --  8.9 9.4 12.0*  LATICACIDVEN 2.2* 2.3*  --  2.1*  --   --   --       Liver Function Tests: LastLabs  Recent Labs  Lab 07/11/2018 1651 07/29/18 0416 07/29/18 0653 07/29/18 1607 07/30/18 0420  AST 1,103*  --  756*  --   --   ALT 1,058*  --  945*  --   --   ALKPHOS 86  --  78  --   --   BILITOT 3.0*  --  3.4*  --   --   PROT 7.2  --  6.4*  --   --   ALBUMIN 3.6 3.4* 3.4* 3.2* 3.2*     LastLabs  No results for input(s): LIPASE, AMYLASE in the last 168 hours.   LastLabs     Recent Labs  Lab 07/10/2018 1908  AMMONIA 21      ABG Labs(Brief)          Component Value Date/Time   O2SAT 53.0 07/30/2018 0420       Coagulation Profile: LastLabs  Recent Labs  Lab 07/20/2018 1651  INR 1.81  1.83      Cardiac Enzymes: LastLabs        Recent Labs  Lab 07/04/2018 1651 07/27/2018 2049 07/29/18 0150 07/29/18 0653  CKTOTAL 608*  --   --   --   TROPONINI  --  38.76* 28.85* 24.43*      HbA1C: LastLabs  No results found for: HGBA1C    CBG: LastLabs     Recent Labs  Lab 08/03/2018 1532  GLUCAP 148*      Review of Systems:   Denies nausea, chest pain or shortness of breath now or overnight when he was in vtach.   Past Medical History  He,  has a past medical history of Allergy, adenomatous colonic polyps (03/07/2017), Hyperlipidemia, and Hypertension.   Surgical History         Past Surgical History:  Procedure Laterality Date  . COLONOSCOPY    . POLYPECTOMY    . TONSILLECTOMY     age 78     Social History   reports that he has been smoking cigars. He  has never used smokeless tobacco. He reports current alcohol use of about 2.0 standard drinks of alcohol per week. He reports current drug use. Drug: Marijuana.   Family History   His family history includes COPD in his father and sister; Colon polyps in his brother; Stroke in his brother. There is no history of Colon cancer, Esophageal cancer, Rectal cancer, Stomach cancer, Diabetes, or Colitis.   Allergies      Allergies  Allergen Reactions  . Penicillins Hives    Did it involve swelling of the face/tongue/throat, SOB, or low BP? YES Did it involve sudden or severe rash/hives, skin peeling, or any reaction on the inside of your mouth or nose? NO Did you need to seek medical attention at a hospital or doctor's office? YES When did it last happen? 30 years ago If all above answers are "NO", may proceed with cephalosporin use.  . Simvastatin Other (See Comments)    insomnia     Home Medications         Prior to Admission medications   Medication Sig Start Date End Date Taking? Authorizing Provider  amLODipine (NORVASC) 10 MG tablet Take 10 mg by mouth daily.   Yes [provider]  atorvastatin (LIPITOR) 40 MG tablet Take 40 mg by mouth daily.   Yes [provider]  Cetirizine HCl 10 MG CAPS Take 1 capsule by mouth as needed (allergies).    Yes [provider]  hydrochlorothiazide (HYDRODIURIL) 25 MG tablet  Take 25 mg by mouth daily.   Yes [provider]  losartan (COZAAR) 50 MG tablet Take 50 mg by mouth daily.   Yes [provider]  omega-3 fish oil (MAXEPA) 1000 MG CAPS capsule Take 2 capsules by mouth daily.   Yes [provider]    Attending note:  63 year old male who presents 1 wk after NSTEMI who developed hypotension and acute renal failure so was not taken to the cath lab.  On CRRT overnight, no new complaints.  On exam, diffuse crackles.  I reviewed CXR myself, pulmonary edema noted.  Discussed  with PCCM-NP.  Will continue pressors support for now.  Will continue CRRT.  Amio for VT.  Milrinone for cardiogenic failure.  No abx necessary at this point.  PCCM will continue to follow.  The patient is critically ill with multiple organ systems failure and requires high complexity decision making for assessment and support, frequent evaluation and titration of therapies, application of advanced monitoring technologies and extensive interpretation of multiple databases.   Critical Care Time devoted to patient care services described in this note is  33  Minutes. This time reflects time of care of this signee Dr Jennet Maduro. This critical care time does not reflect procedure time, or teaching time or supervisory time of PA/NP/Med student/Med Resident etc but could involve care discussion time.  Rush Farmer, M.D. [facility] Pulmonary/Critical Care Medicine. Pager: [phone]. After hours pager: [phone].

## 2018-07-31 NOTE — Progress Notes (Signed)
Pt sustained Vtach for at least 30 seconds, BP systolic dropped to mid 88Q during run, otherwise pt asymptomatic. MD notified.

## 2018-07-31 NOTE — Progress Notes (Signed)
ANTICOAGULATION CONSULT NOTE - [place] for heparin Indication: Chest pain/ACS  Allergies  Allergen Reactions  . Penicillins Hives    Did it involve swelling of the face/tongue/throat, SOB, or low BP? YES Did it involve sudden or severe rash/hives, skin peeling, or any reaction on the inside of your mouth or nose? NO Did you need to seek medical attention at a hospital or doctor's office? YES When did it last happen? 30 years ago If all above answers are "NO", may proceed with cephalosporin use.  . Simvastatin Other (See Comments)    insomnia    Patient Measurements: Height: 6\' 3"  (190.5 cm) Weight: 177 lb 7.5 oz (80.5 kg) IBW/kg (Calculated) : 84.5 Heparin Dosing Weight: 89.8 kg  Vital Signs: Temp: 97.5 F (36.4 C) (01/28 0741) Temp Source: Oral (01/28 0741) Pulse Rate: 85 (01/28 0730)  Labs: Recent Labs    07/20/2018 1651 07/12/2018 2049 07/29/18 0150  07/29/18 0653  07/29/18 1607  07/29/18 2240 07/30/18 0420 07/30/18 0735 07/30/18 1537 07/31/18 0500 07/31/18 0743  HGB  --   --  8.6*  --  8.3*  --  9.0*  --   --  9.2*  --   --  9.0*  --   HCT  --   --  27.9*  --  27.1*  --  29.5*  --   --  30.5*  --   --  29.0*  --   PLT 194  --  191  --  186  --  182  --   --  188  --   --  184  --   APTT 32  --  55*  --   --   --   --   --   --  85*  --   --  81*  --   LABPROT 20.8*  20.9*  --   --   --   --   --   --   --   --   --   --   --   --   --   INR 1.81  1.83  --   --   --   --   --   --   --   --   --   --   --   --   --   HEPARINUNFRC  --   --   --    < >  --    < >  --   --  0.28*  --  0.56  --   --  0.37  CREATININE  --   --   --    < > 2.19*  --  1.90*   < >  --  1.88*  --  1.93* 2.03*  --   CKTOTAL 608*  --   --   --   --   --   --   --   --   --   --   --   --   --   TROPONINI  --  38.76* 28.85*  --  24.43*  --   --   --   --   --   --   --   --   --    < > = values in this interval not displayed.    Estimated Creatinine  Clearance: 43 mL/min (A) (by C-G formula based on SCr of 2.03 mg/dL (H)).   Medical History: Past Medical History:  Diagnosis  Date  . Allergy   . Hx of adenomatous colonic polyps 03/07/2017  . Hyperlipidemia   . Hypertension    Assessment: 63 yo male admitted with chest pain and r/o NSTEMI/ACS. MD wishes to start heparin drip. No anticoagulation noted PTA.    Heparin level therapeutic this morning, H/H low but stable. Will discuss LOT for heparin therapy with HF team.  Goal of Therapy:  Heparin level 0.3-0.7 units/ml Monitor platelets by anticoagulation protocol: Yes   Plan:  -Continue heparin 1500 units/hr -Daily heparin level and CBC  Arrie Senate, PharmD, BCPS Clinical Pharmacist [phone] Please check AMION for all [facility] numbers 07/31/2018

## 2018-07-31 NOTE — Progress Notes (Signed)
[facility] NEPHROLOGY PROGRESS NOTE  Assessment/ Plan: Pt is a 63 y.o. yo male with CHF, cardiogenic shock developed AKI with uremia, now on CRRT.   #Acute kidney injury secondary to cardiogenic shock: CRRT started for severe azotemia and uremic symptoms.  On 4K bath with UF net 100 cc an hour.  CVP is 14-16 today.  Monitor BMP.  Avoid nephrotoxins.  Patient remains anuric.  #Cardiogenic shock, acute systolic CHF exacerbation: Recent MI, on CRRT to assist for volume management.  He is on amiodarone, milrinone per cardiology.  #Hypotension in the setting of CHF exacerbation: On pressors.  #Anemia: Received blood transfusion and IV iron.  Monitor hemoglobin.  #Hypervolemic hyponatremia: Serum sodium 132.  Continue UF.  Minimize free water.  Subjective: Seen and examined at bedside.  Lying in bed comfortable.  Denied headache, dizziness, nausea vomiting chest pain.  No event overnight. Objective Vital signs in last 24 hours: Vitals:   07/31/18 0700 07/31/18 0715 07/31/18 0730 07/31/18 0741  BP:      Pulse: 82 87 85   Resp: (!) 22 15 11    Temp:    (!) 97.5 F (36.4 C)  TempSrc:    Oral  SpO2: 98% 97% 97%   Weight:      Height:       Weight change: -1 kg  Intake/Output Summary (Last 24 hours) at 07/31/2018 0825 Last data filed at 07/31/2018 0800 Gross per 24 hour  Intake 1281.55 ml  Output 3906 ml  Net -2624.45 ml       Labs: Basic Metabolic Panel: Recent Labs  Lab 07/30/18 0420 07/30/18 1537 07/31/18 0500  NA 131* 131* 132*  K 4.4 4.2 4.2  CL 96* 96* 96*  CO2 22 21* 24  GLUCOSE 136* 127* 116*  BUN 68* 48* 34*  CREATININE 1.88* 1.93* 2.03*  CALCIUM 7.7* 7.7* 7.9*  PHOS 3.2 3.1 2.7   Liver Function Tests: Recent Labs  Lab 07/29/18 0653  07/30/18 0735 07/30/18 1537 07/31/18 0500  AST 756*  --  284*  --  187*  ALT 945*  --  767*  --  640*  ALKPHOS 78  --  78  --  79  BILITOT 3.4*  --  3.0*  --  2.7*  PROT 6.4*  --  6.7  --  6.5  ALBUMIN  3.4*   < > 3.1* 3.2* 3.1*   < > = values in this interval not displayed.   No results for input(s): LIPASE, AMYLASE in the last 168 hours. Recent Labs  Lab 07/17/2018 1908  AMMONIA 21   CBC: Recent Labs  Lab 07/09/2018 1651 07/29/18 0150 07/29/18 0653 07/29/18 1607 07/30/18 0420 07/31/18 0500  WBC  --  10.2 8.9 9.4 12.0* 11.8*  NEUTROABS 78.6*  --   --   --   --   --   HGB  --  8.6* 8.3* 9.0* 9.2* 9.0*  HCT  --  27.9* 27.1* 29.5* 30.5* 29.0*  MCV  --  68.2* 68.1* 68.9* 69.6* 70.0*  PLT 194 191 186 182 188 184   Cardiac Enzymes: Recent Labs  Lab 07/26/2018 1651 07/09/2018 2049 07/29/18 0150 07/29/18 0653  CKTOTAL 608*  --   --   --   TROPONINI  --  38.76* 28.85* 24.43*   CBG: Recent Labs  Lab 07/23/2018 1532  GLUCAP 148*    Iron Studies:  Recent Labs    07/05/2018 1651  IRON 14*  TIBC 458*  FERRITIN 28   Studies/Results:  Dg Chest Port 1 View  Result Date: 07/29/2018 CLINICAL DATA:  Encounter for central line placement. EXAM: PORTABLE CHEST 1 VIEW COMPARISON:  07/29/2018 FINDINGS: The there is been interval placement of a left IJ catheter with tip projecting over the SVC. No pneumothorax identified. Cardiac enlargement is stable. No pleural effusion or edema identified. No airspace opacities. IMPRESSION: 1. No complications after left IJ catheter placement. The catheter tip projects over the SVC. 2. Cardiac enlargement. Electronically Signed   By: Kerby Moors M.D.   On: 07/29/2018 13:27    Medications: Infusions: .  prismasol BGK 4/2.5 400 mL/hr at 07/31/18 0314  .  prismasol BGK 4/2.5 200 mL/hr at 07/31/18 0315  . amiodarone 30 mg/hr (07/31/18 0800)  . ferumoxytol Stopped (07/29/18 1413)  . heparin 1,500 Units/hr (07/31/18 0800)  . milrinone 0.125 mcg/kg/min (07/31/18 0800)  . norepinephrine (LEVOPHED) Adult infusion 12 mcg/min (07/31/18 0800)  . prismasol bgk dialysis solution with potassium 1,800 mL/hr at 07/31/18 0603    Scheduled Medications: . aspirin  EC  81 mg Oral Daily  . Chlorhexidine Gluconate Cloth  6 each Topical Daily  . mouth rinse  15 mL Mouth Rinse BID  . pantoprazole (PROTONIX) IV  40 mg Intravenous BID  . sodium chloride flush  10-40 mL Intracatheter Q12H    have reviewed scheduled and prn medications.  Physical Exam: General:NAD, comfortable Heart:RRR, s1s2 nl, no rubs Lungs:clear b/l, no cracle Abdomen:soft, Non-tender, non-distended Extremities:edema + Dialysis Access: Femoral catheter.  Murl Zogg Prasad Johndavid Geralds 07/31/2018,8:25 AM  LOS: 3 days

## 2018-07-31 NOTE — Progress Notes (Addendum)
Paged cardiologist on call regarding frequent PVCs and runs of Vtach. Pt reports "not feeling anything" when it happens. Vitals has remained stable.  Zoll pads placed on pt. Awaiting response from MD.

## 2018-08-01 ENCOUNTER — Encounter (HOSPITAL_COMMUNITY): Admission: EM | Disposition: E | Payer: Self-pay | Source: Home / Self Care | Attending: Cardiology

## 2018-08-01 HISTORY — PX: DIALYSIS/PERMA CATHETER INSERTION: CATH118288

## 2018-08-01 HISTORY — PX: RIGHT HEART CATH: CATH118263

## 2018-08-01 LAB — RENAL FUNCTION PANEL
Albumin: 3.1 g/dL — ABNORMAL LOW (ref 3.5–5.0)
Albumin: 3 g/dL — ABNORMAL LOW (ref 3.5–5.0)
Anion gap: 11 (ref 5–15)
Anion gap: 14 (ref 5–15)
BUN: 24 mg/dL — AB (ref 8–23)
BUN: 24 mg/dL — ABNORMAL HIGH (ref 8–23)
CALCIUM: 8.2 mg/dL — AB (ref 8.9–10.3)
CO2: 24 mmol/L (ref 22–32)
CO2: 23 mmol/L (ref 22–32)
CREATININE: 2.61 mg/dL — AB (ref 0.61–1.24)
Calcium: 8.1 mg/dL — ABNORMAL LOW (ref 8.9–10.3)
Chloride: 97 mmol/L — ABNORMAL LOW (ref 98–111)
Chloride: 91 mmol/L — ABNORMAL LOW (ref 98–111)
Creatinine, Ser: 2.17 mg/dL — ABNORMAL HIGH (ref 0.61–1.24)
GFR calc Af Amer: 36 mL/min — ABNORMAL LOW (ref >60)
GFR calc Af Amer: 29 mL/min — ABNORMAL LOW (ref >60)
GFR calc non Af Amer: 31 mL/min — ABNORMAL LOW (ref >60)
GFR calc non Af Amer: 25 mL/min — ABNORMAL LOW (ref >60)
Glucose, Bld: 135 mg/dL — ABNORMAL HIGH (ref 70–99)
Glucose, Bld: 145 mg/dL — ABNORMAL HIGH (ref 70–99)
Phosphorus: 2.2 mg/dL — ABNORMAL LOW (ref 2.5–4.6)
Phosphorus: 2.6 mg/dL (ref 2.5–4.6)
Potassium: 3.8 mmol/L (ref 3.5–5.1)
Potassium: 4 mmol/L (ref 3.5–5.1)
SODIUM: 132 mmol/L — AB (ref 135–145)
Sodium: 128 mmol/L — ABNORMAL LOW (ref 135–145)

## 2018-08-01 LAB — COOXEMETRY PANEL
Carboxyhemoglobin: 1.7 % — ABNORMAL HIGH (ref 0.5–1.5)
Methemoglobin: 1.2 % (ref 0.0–1.5)
O2 Saturation: 50.4 %
Total hemoglobin: 9.6 g/dL — ABNORMAL LOW (ref 12.0–16.0)

## 2018-08-01 LAB — CBC
HCT: 30.4 % — ABNORMAL LOW (ref 39.0–52.0)
Hemoglobin: 9.2 g/dL — ABNORMAL LOW (ref 13.0–17.0)
MCH: 21.3 pg — ABNORMAL LOW (ref 26.0–34.0)
MCHC: 30.3 g/dL (ref 30.0–36.0)
MCV: 70.4 fL — ABNORMAL LOW (ref 80.0–100.0)
PLATELETS: 192 10*3/uL (ref 150–400)
RBC: 4.32 MIL/uL (ref 4.22–5.81)
RDW: 23 % — ABNORMAL HIGH (ref 11.5–15.5)
WBC: 12.6 10*3/uL — ABNORMAL HIGH (ref 4.0–10.5)
nRBC: 67.6 % — ABNORMAL HIGH (ref 0.0–0.2)

## 2018-08-01 LAB — MAGNESIUM: Magnesium: 2.8 mg/dL — ABNORMAL HIGH (ref 1.7–2.4)

## 2018-08-01 LAB — POCT I-STAT EG7
Acid-base deficit: 1 mmol/L (ref 0.0–2.0)
Bicarbonate: 24 mmol/L (ref 20.0–28.0)
Calcium, Ion: 1 mmol/L — ABNORMAL LOW (ref 1.15–1.40)
HCT: 30 % — ABNORMAL LOW (ref 39.0–52.0)
HEMOGLOBIN: 10.2 g/dL — AB (ref 13.0–17.0)
O2 Saturation: 54 %
POTASSIUM: 3.7 mmol/L (ref 3.5–5.1)
Sodium: 135 mmol/L (ref 135–145)
TCO2: 25 mmol/L (ref 22–32)
pCO2, Ven: 39.6 mmHg — ABNORMAL LOW (ref 44.0–60.0)
pH, Ven: 7.391 (ref 7.250–7.430)
pO2, Ven: 29 mmHg — CL (ref 32.0–45.0)

## 2018-08-01 LAB — HEPARIN LEVEL (UNFRACTIONATED): Heparin Unfractionated: 0.42 IU/mL (ref 0.30–0.70)

## 2018-08-01 LAB — CORTISOL: Cortisol, Plasma: 22.4 ug/dL

## 2018-08-01 LAB — CALCIUM, IONIZED: Calcium, Ionized, Serum: 4.1 mg/dL — ABNORMAL LOW (ref 4.5–5.6)

## 2018-08-01 LAB — APTT: aPTT: 94 seconds — ABNORMAL HIGH (ref 24–36)

## 2018-08-01 SURGERY — DIALYSIS/PERMA CATHETER INSERTION

## 2018-08-01 MED ORDER — HEPARIN (PORCINE) IN NACL 1000-0.9 UT/500ML-% IV SOLN
INTRAVENOUS | Status: DC | PRN
  Administered 2018-08-01: 500 mL

## 2018-08-01 MED ORDER — SODIUM CHLORIDE 0.9 % IV SOLN: INTRAVENOUS | Status: DC | PRN

## 2018-08-01 MED ORDER — HEPARIN (PORCINE) 25000 UT/250ML-% IV SOLN
1500.0000 [IU]/h | INTRAVENOUS | Status: DC
  Administered 2018-08-01 – 2018-08-06 (×7): 1500 [IU]/h via INTRAVENOUS
  Filled 2018-08-01 (×7): qty 250

## 2018-08-01 MED ORDER — HEPARIN SODIUM (PORCINE) 1000 UNIT/ML IJ SOLN
INTRAMUSCULAR | Status: AC
  Filled 2018-08-01: qty 1

## 2018-08-01 MED ORDER — FENTANYL CITRATE (PF) 100 MCG/2ML IJ SOLN
INTRAMUSCULAR | Status: DC | PRN
  Administered 2018-08-01: 25 ug via INTRAVENOUS

## 2018-08-01 MED ORDER — HEPARIN (PORCINE) IN NACL 1000-0.9 UT/500ML-% IV SOLN
INTRAVENOUS | Status: AC
  Filled 2018-08-01: qty 500

## 2018-08-01 MED ORDER — AMIODARONE LOAD VIA INFUSION
150.0000 mg | Freq: Once | INTRAVENOUS | Status: AC
  Administered 2018-08-01: 150 mg via INTRAVENOUS
  Filled 2018-08-01: qty 83.34

## 2018-08-01 MED ORDER — PRISMASOL BGK 4/2.5 32-4-2.5 MEQ/L IV SOLN
INTRAVENOUS | Status: DC
  Administered 2018-08-01 – 2018-08-10 (×62): via INTRAVENOUS_CENTRAL
  Filled 2018-08-01 (×111): qty 5000

## 2018-08-01 MED ORDER — MILRINONE LACTATE IN DEXTROSE 20-5 MG/100ML-% IV SOLN
0.1250 ug/kg/min | INTRAVENOUS | Status: DC
  Administered 2018-08-01: 0.125 ug/kg/min via INTRAVENOUS
  Filled 2018-08-01: qty 100

## 2018-08-01 MED ORDER — LIDOCAINE IN D5W 4-5 MG/ML-% IV SOLN
INTRAVENOUS | Status: AC
  Filled 2018-08-01: qty 500

## 2018-08-01 MED ORDER — LIDOCAINE IN D5W 4-5 MG/ML-% IV SOLN
INTRAVENOUS | Status: AC | PRN
  Administered 2018-08-01: 1 mg/min via INTRAVENOUS

## 2018-08-01 MED ORDER — MIDAZOLAM HCL 2 MG/2ML IJ SOLN
INTRAMUSCULAR | Status: AC
  Filled 2018-08-01: qty 2

## 2018-08-01 MED ORDER — LIDOCAINE HCL (CARDIAC) PF 100 MG/5ML IV SOSY
PREFILLED_SYRINGE | INTRAVENOUS | Status: AC
  Filled 2018-08-01: qty 5

## 2018-08-01 MED ORDER — HEPARIN SODIUM (PORCINE) 1000 UNIT/ML IJ SOLN
INTRAMUSCULAR | Status: DC | PRN
  Administered 2018-08-01: 2400 [IU] via INTRAVENOUS

## 2018-08-01 MED ORDER — ACETAMINOPHEN 325 MG PO TABS
650.0000 mg | ORAL_TABLET | ORAL | Status: DC | PRN
  Administered 2018-08-19 – 2018-08-20 (×2): 650 mg via ORAL
  Filled 2018-08-01 (×2): qty 2

## 2018-08-01 MED ORDER — LIDOCAINE HCL (CARDIAC) PF 100 MG/5ML IV SOSY
PREFILLED_SYRINGE | INTRAVENOUS | Status: DC | PRN
  Administered 2018-08-01: 100 mg via INTRAVENOUS

## 2018-08-01 MED ORDER — IOHEXOL 350 MG/ML SOLN
INTRAVENOUS | Status: DC | PRN
  Administered 2018-08-01: 5 mL via INTRAVENOUS

## 2018-08-01 MED ORDER — MIDAZOLAM HCL 2 MG/2ML IJ SOLN
INTRAMUSCULAR | Status: DC | PRN
  Administered 2018-08-01: 1 mg via INTRAVENOUS

## 2018-08-01 MED ORDER — FENTANYL CITRATE (PF) 100 MCG/2ML IJ SOLN
INTRAMUSCULAR | Status: AC
  Filled 2018-08-01: qty 2

## 2018-08-01 MED ORDER — SODIUM CHLORIDE 0.9 % IV SOLN: 250.0000 mL | INTRAVENOUS | Status: DC | PRN

## 2018-08-01 MED ORDER — LIDOCAINE IN D5W 4-5 MG/ML-% IV SOLN
1.0000 mg/min | INTRAVENOUS | Status: DC
  Filled 2018-08-01: qty 500

## 2018-08-01 MED ORDER — SODIUM CHLORIDE 0.9% FLUSH
3.0000 mL | Freq: Two times a day (BID) | INTRAVENOUS | Status: DC
  Administered 2018-08-01 – 2018-08-14 (×24): 3 mL via INTRAVENOUS

## 2018-08-01 MED ORDER — SODIUM CHLORIDE 0.9% FLUSH: 3.0000 mL | INTRAVENOUS | Status: DC | PRN

## 2018-08-01 MED ORDER — LIDOCAINE HCL (PF) 1 % IJ SOLN
INTRAMUSCULAR | Status: DC | PRN
  Administered 2018-08-01: 2 mL

## 2018-08-01 MED ORDER — ONDANSETRON HCL 4 MG/2ML IJ SOLN
4.0000 mg | Freq: Four times a day (QID) | INTRAMUSCULAR | Status: DC | PRN
  Administered 2018-08-03 – 2018-08-18 (×22): 4 mg via INTRAVENOUS
  Filled 2018-08-01 (×23): qty 2

## 2018-08-01 MED ORDER — LIDOCAINE HCL (PF) 1 % IJ SOLN
INTRAMUSCULAR | Status: AC
  Filled 2018-08-01: qty 30

## 2018-08-01 SURGICAL SUPPLY — 9 items
CATH SWAN GANZ 7F STRAIGHT (CATHETERS) ×2 IMPLANT
KIT PV (KITS) ×3 IMPLANT
SHEATH PINNACLE 7F 10CM (SHEATH) ×2 IMPLANT
SHEATH PROBE COVER 6X72 (BAG) ×2 IMPLANT
TRANSDUCER W/STOPCOCK (MISCELLANEOUS) ×3 IMPLANT
TRAY PV CATH (CUSTOM PROCEDURE TRAY) ×3 IMPLANT
TUBING ART PRESS 72  MALE/FEM (TUBING) ×2
TUBING ART PRESS 72 MALE/FEM (TUBING) IMPLANT
WIRE HI TORQ VERSACORE-J 145CM (WIRE) ×2 IMPLANT

## 2018-08-01 NOTE — Progress Notes (Signed)
RT was called due to patient's arterial line having trouble.  RT noted that patient's arterial line had come out.  MD instructed RT to try to replace arterial line.  This RT and resident MD attempted maximum amount of times for new arterial line however unable to successfully place.  Will notify attending MD.

## 2018-08-01 NOTE — H&P (View-Only) (Signed)
Advanced Heart Failure Rounding Note  PCP-Cardiologist: No primary care provider on file.   Subjective:    1/26 Had VT twice ~1915 for 1-2 minutes. Started on amio. Re-bolused due to 8 minute run ~2300   Remains on milrinone 0.25 and NE 14. Having frequent V/NSVT despite amio. Remains on CRT Weight down 8 pounds Anuric  C/o CP with VT  Echo 07/11/2018 LVEF 15%, with severe RV failure.   Objective:   Weight Range: 77.1 kg Body mass index is 21.25 kg/m.   Vital Signs:   Temp:  [97.3 F (36.3 C)-98.2 F (36.8 C)] 98 F (36.7 C) (01/29 0729) Pulse Rate:  [68-136] 126 (01/29 0730) Resp:  [10-23] 15 (01/29 0730) BP: (95-109)/(67-90) 96/72 (01/29 0730) SpO2:  [92 %-100 %] 95 % (01/29 0834) Arterial Line BP: (71-122)/(54-81) 88/58 (01/29 0730) Weight:  [77.1 kg] 77.1 kg (01/29 0500) Last BM Date: 07/31/18  Weight change: Filed Weights   07/30/18 0430 07/31/18 0600 07/09/2018 0500  Weight: 81.5 kg 80.5 kg 77.1 kg   Intake/Output:   Intake/Output Summary (Last 24 hours) at 07/12/2018 0844 Last data filed at 07/29/2018 0800 Gross per 24 hour  Intake 2323.7 ml  Output 4096 ml  Net -1772.3 ml    Physical Exam    General:  Lying in bed  No resp difficulty HEENT: normal Neck: supple. no JVD. Carotids 2+ bilat; no bruits. No lymphadenopathy or thryomegaly appreciated. LIJ TLC Cor: PMI nondisplaced. Regular rate & rhythm. With frequent ectopy Lungs: clear Abdomen: soft, nontender, nondistended. No hepatosplenomegaly. No bruits or masses. Good bowel sounds. Extremities: no cyanosis, clubbing, rash, edema  RFV HD cath Neuro: alert & orientedx3, cranial nerves grossly intact. moves all 4 extremities w/o difficulty. Affect pleasant   Telemetry   NSR 80-90s with frequent NSVT Personally reviewed   EKG    No new tracings.    Labs    CBC Recent Labs    07/31/18 0500 07/29/2018 0241  WBC 11.8* 12.6*  HGB 9.0* 9.2*  HCT 29.0* 30.4*  MCV 70.0* 70.4*  PLT 184 270    Basic Metabolic Panel Recent Labs    07/31/18 0500  07/31/18 1515 07/27/2018 0241 07/22/2018 0243  NA 132*   < > 131*  --  132*  K 4.2   < > 4.3  --  3.8  CL 96*   < > 96*  --  97*  CO2 24   < > 23  --  24  GLUCOSE 116*   < > 127*  --  135*  BUN 34*   < > 29*  --  24*  CREATININE 2.03*   < > 2.04*  --  2.17*  CALCIUM 7.9*   < > 8.0*  --  8.2*  MG 2.8*  --   --  2.8*  --   PHOS 2.7   < > 2.6  --  2.2*   < > = values in this interval not displayed.   Liver Function Tests Recent Labs    07/30/18 0735  07/31/18 0500  07/31/18 1515 07/10/2018 0243  AST 284*  --  187*  --   --   --   ALT 767*  --  640*  --   --   --   ALKPHOS 78  --  79  --   --   --   BILITOT 3.0*  --  2.7*  --   --   --   PROT 6.7  --  6.5  --   --   --  ALBUMIN 3.1*   < > 3.1*   < > 3.1* 3.1*   < > = values in this interval not displayed.   No results for input(s): LIPASE, AMYLASE in the last 72 hours. Cardiac Enzymes No results for input(s): CKTOTAL, CKMB, CKMBINDEX, TROPONINI in the last 72 hours.  BNP: BNP (last 3 results) Recent Labs    07/22/2018 1651  BNP 4,188.4*    ProBNP (last 3 results) No results for input(s): PROBNP in the last 8760 hours.   D-Dimer No results for input(s): DDIMER in the last 72 hours. Hemoglobin A1C No results for input(s): HGBA1C in the last 72 hours. Fasting Lipid Panel No results for input(s): CHOL, HDL, LDLCALC, TRIG, CHOLHDL, LDLDIRECT in the last 72 hours. Thyroid Function Tests No results for input(s): TSH, T4TOTAL, T3FREE, THYROIDAB in the last 72 hours.  Invalid input(s): FREET3  Other results:   Imaging    No results found.   Medications:     Scheduled Medications: . [MAR Hold] aspirin EC  81 mg Oral Daily  . [MAR Hold] Chlorhexidine Gluconate Cloth  6 each Topical Daily  . [MAR Hold] mouth rinse  15 mL Mouth Rinse BID  . [MAR Hold] pantoprazole (PROTONIX) IV  40 mg Intravenous BID  . sodium chloride flush  3 mL Intravenous Q12H     Infusions: .  prismasol BGK 4/2.5 400 mL/hr at 07/17/2018 0514  .  prismasol BGK 4/2.5 200 mL/hr at 08/03/2018 0515  . sodium chloride    . sodium chloride    . amiodarone 60 mg/hr (07/04/2018 0800)  . [MAR Hold] ferumoxytol Stopped (07/29/18 1413)  . heparin Stopped (07/09/2018 0745)  . milrinone    . [MAR Hold] norepinephrine (LEVOPHED) Adult infusion 14 mcg/min (07/05/2018 0800)  . prismasol bgk dialysis solution with potassium 1,800 mL/hr at 07/17/2018 0516    PRN Medications: sodium chloride, [MAR Hold] acetaminophen, [MAR Hold] alteplase, Heparin (Porcine) in NaCl, [MAR Hold] heparin, [MAR Hold] nitroGLYCERIN, [MAR Hold] ondansetron (ZOFRAN) IV, [MAR Hold] sodium chloride, sodium chloride flush   Patient Profile   63 y.o.malewith past medical history of hypertension, hyperlipidemia admitted with recent but late presenting inferior lateral myocardial infarction, cardiogenic shock, acute renal failure and microcytic anemia. Echocardiogram showed severe LV and RV dysfunction and moderate mitral regurgitation.  Assessment/Plan   1. Acute systolic HF with cardiogenic shock due to OOH MI - Echo 1/5 EF 15% severe RV dysfunction and moderate MR - Initial co-ox 28% but this was drawn femorally so inaccurate.  - Coox 50% on milrinone 0.25 mcg/kg/min and NE 14. With incessant NSVT will decrease mirlinone back to 0.125 - CVP 10-12. CRRT pulling -100 cc/hr currently.  - Plan RHC today and change HD cath to RIJ - Continue volume removal with CRRT.  - No ACE/ARB/ARNI or b-blocker with shock and AKI  2. CAD - s/p OOH in inferolateral MI - Initial trop 28. Trended down.  - Having some CP but mostly related to VT - Continue ASA and statin. No b-blocker with shock - Will need cath prior to discharge but would hold until we see if kidneys recover   3. AKI/ESRD with uremia - likely due to ATN and hypoperfusion. Initial BUN/CR 198/3.91 - Baseline creatinine normal several months ago - Renal  following CRT started on 1/25 via RFV cath. Goal -100 cc/hr now.  - Renal u/s 1/26: Mildly echogenic right kidney, compatible with chronic medical renal disease. - Continue heparin. Watch closely for bleeding. Dosing per PharmD.   4.  Microcytic anemia due to IDA - Hgb 9.2 today. MCV 69.6.  - Smear without schistocytes - Has received 2u RBCS - Iron stores low. Given Feraheme - Will eventually need GI w/u - Continue protonix  5. Shock liver - Improving with hemodynamic support.   6. VT - Continue on amiodarone gtt. Keep K > 4.0 Mg > 2.0 - Decrease milrinone. May need Lifevest.  CRITICAL CARE Performed by: Glori Bickers  Total critical care time: 35 minutes  Critical care time was exclusive of separately billable procedures and treating other patients.  Critical care was necessary to treat or prevent imminent or life-threatening deterioration.  Critical care was time spent personally by me (independent of midlevel providers or residents) on the following activities: development of treatment plan with patient and/or surrogate as well as nursing, discussions with consultants, evaluation of patient's response to treatment, examination of patient, obtaining history from patient or surrogate, ordering and performing treatments and interventions, ordering and review of laboratory studies, ordering and review of radiographic studies, pulse oximetry and re-evaluation of patient's condition.    Length of Stay: [place], MD  07/26/2018, 8:44 AM  Advanced Heart Failure Team Pager [phone] (M-F; 7a - 4p)  Please contact [facility] for night-coverage after hours (4p -7a ) and weekends on [email or web address]

## 2018-08-01 NOTE — Progress Notes (Signed)
[facility] PROGRESS NOTE  Assessment/ Plan: Pt is a 63 y.o. yo male with CHF, cardiogenic shock developed AKI with uremia, now on CRRT.  #Acute kidney injury secondary to cardiogenic shock: CRRT started for severe azotemia and uremic symptoms.  On 4K bath with UF net 100 cc an hour.  CVP is 14-16 today.  The dialysis catheter changed to IJ today.  He remains anuric with no sign of renal recovery.  Discontinue groin catheter.  Monitor BMP.  Avoid nephrotoxins.    #Cardiogenic shock, acute systolic CHF exacerbation: Recent MI, on CRRT to assist for volume management.  He is on amiodarone, milrinone, Levophed per cardiology.  S/p right heart cath today which showed volume overload.  #Hypotension in the setting of CHF exacerbation: On pressors.  #Anemia: Received blood transfusion and IV iron.  Monitor hemoglobin.  #Hypervolemic hyponatremia: Serum sodium 132.  Continue UF.  Minimize free water.  Discussed with ICU and cardiology team.  Subjective: Seen and examined at bedside.  Lying comfortable. Status post cardiac cath and  dialysis catheter changed to right IJ today.  Family members at bedside. Objective Vital signs in last 24 hours: Vitals:   07/04/2018 1315 07/19/2018 1345 07/10/2018 1400 07/23/2018 1504  BP: (!) 88/63 102/80 99/73 92/66   Pulse: 87 74 (!) 46 87  Resp: 18 18 17 19   Temp:      TempSrc:      SpO2: 97% 95% 96% 98%  Weight:      Height:       Weight change: -3.4 kg  Intake/Output Summary (Last 24 hours) at 07/14/2018 1523 Last data filed at 07/30/2018 1500 Gross per 24 hour  Intake 2265.8 ml  Output 3865 ml  Net -1599.2 ml       Labs: Basic Metabolic Panel: Recent Labs  Lab 07/31/18 0743 07/31/18 1515 07/21/2018 0243  NA 132* 131* 132*  K 4.2 4.3 3.8  CL 96* 96* 97*  CO2 24 23 24   GLUCOSE 116* 127* 135*  BUN 33* 29* 24*  CREATININE 1.99* 2.04* 2.17*  CALCIUM 7.9* 8.0* 8.2*  PHOS 2.7 2.6 2.2*   Liver Function Tests: Recent  Labs  Lab 07/29/18 0653  07/30/18 0735  07/31/18 0500 07/31/18 0743 07/31/18 1515 07/17/2018 0243  AST 756*  --  284*  --  187*  --   --   --   ALT 945*  --  767*  --  640*  --   --   --   ALKPHOS 78  --  78  --  79  --   --   --   BILITOT 3.4*  --  3.0*  --  2.7*  --   --   --   PROT 6.4*  --  6.7  --  6.5  --   --   --   ALBUMIN 3.4*   < > 3.1*   < > 3.1* 3.1* 3.1* 3.1*   < > = values in this interval not displayed.   No results for input(s): LIPASE, AMYLASE in the last 168 hours. Recent Labs  Lab 07/10/2018 1908  AMMONIA 21   CBC: Recent Labs  Lab 07/18/2018 1651  07/29/18 0653 07/29/18 1607 07/30/18 0420 07/31/18 0500 08/02/2018 0241  WBC  --    < > 8.9 9.4 12.0* 11.8* 12.6*  NEUTROABS 78.6*  --   --   --   --   --   --   HGB  --    < >  8.3* 9.0* 9.2* 9.0* 9.2*  HCT  --    < > 27.1* 29.5* 30.5* 29.0* 30.4*  MCV  --    < > 68.1* 68.9* 69.6* 70.0* 70.4*  PLT 194   < > 186 182 188 184 192   < > = values in this interval not displayed.   Cardiac Enzymes: Recent Labs  Lab 07/04/2018 1651 07/27/2018 2049 07/29/18 0150 07/29/18 0653  CKTOTAL 608*  --   --   --   TROPONINI  --  38.76* 28.85* 24.43*   CBG: Recent Labs  Lab 07/15/2018 1532  GLUCAP 148*    Iron Studies:  No results for input(s): IRON, TIBC, TRANSFERRIN, FERRITIN in the last 72 hours. Studies/Results: No results found.  Medications: Infusions: .  prismasol BGK 4/2.5 400 mL/hr at 07/14/2018 0514  .  prismasol BGK 4/2.5 200 mL/hr at 07/06/2018 0515  . sodium chloride    . sodium chloride    . amiodarone 60 mg/hr (07/26/2018 1500)  . heparin    . lidocaine 1 mg/min (07/29/2018 1500)  . milrinone 0.125 mcg/kg/min (07/27/2018 1500)  . norepinephrine (LEVOPHED) Adult infusion 14 mcg/min (07/08/2018 1500)  . prismasol bgk dialysis solution with potassium 1,800 mL/hr at 07/21/2018 1300    Scheduled Medications: . aspirin EC  81 mg Oral Daily  . Chlorhexidine Gluconate Cloth  6 each Topical Daily  . mouth rinse  15  mL Mouth Rinse BID  . pantoprazole (PROTONIX) IV  40 mg Intravenous BID  . sodium chloride flush  3 mL Intravenous Q12H    have reviewed scheduled and prn medications.  Physical Exam: General: Not in distress Heart:RRR, s1s2 nl, no rubs Lungs: Clear bilateral, no wheezing or crackle Abdomen:soft, Non-tender, non-distended Extremities: No lower extremity edema Dialysis Access: Right IJ dialysis catheter.  Marques Ericson Prasad Sakia Schrimpf 07/31/2018,3:23 PM  LOS: 4 days

## 2018-08-01 NOTE — Progress Notes (Signed)
Advanced Heart Failure Rounding Note  PCP-Cardiologist: No primary care provider on file.   Subjective:    1/26 Had VT twice ~1915 for 1-2 minutes. Started on amio. Re-bolused due to 8 minute run ~2300   Remains on milrinone 0.25 and NE 14. Having frequent V/NSVT despite amio. Remains on CRT Weight down 8 pounds Anuric  C/o CP with VT  Echo 07/07/2018 LVEF 15%, with severe RV failure.   Objective:   Weight Range: 77.1 kg Body mass index is 21.25 kg/m.   Vital Signs:   Temp:  [97.3 F (36.3 C)-98.2 F (36.8 C)] 98 F (36.7 C) (01/29 0729) Pulse Rate:  [68-136] 126 (01/29 0730) Resp:  [10-23] 15 (01/29 0730) BP: (95-109)/(67-90) 96/72 (01/29 0730) SpO2:  [92 %-100 %] 95 % (01/29 0834) Arterial Line BP: (71-122)/(54-81) 88/58 (01/29 0730) Weight:  [77.1 kg] 77.1 kg (01/29 0500) Last BM Date: 07/31/18  Weight change: Filed Weights   07/30/18 0430 07/31/18 0600 07/05/2018 0500  Weight: 81.5 kg 80.5 kg 77.1 kg   Intake/Output:   Intake/Output Summary (Last 24 hours) at 07/08/2018 0844 Last data filed at 07/14/2018 0800 Gross per 24 hour  Intake 2323.7 ml  Output 4096 ml  Net -1772.3 ml    Physical Exam    General:  Lying in bed  No resp difficulty HEENT: normal Neck: supple. no JVD. Carotids 2+ bilat; no bruits. No lymphadenopathy or thryomegaly appreciated. LIJ TLC Cor: PMI nondisplaced. Regular rate & rhythm. With frequent ectopy Lungs: clear Abdomen: soft, nontender, nondistended. No hepatosplenomegaly. No bruits or masses. Good bowel sounds. Extremities: no cyanosis, clubbing, rash, edema  RFV HD cath Neuro: alert & orientedx3, cranial nerves grossly intact. moves all 4 extremities w/o difficulty. Affect pleasant   Telemetry   NSR 80-90s with frequent NSVT Personally reviewed   EKG    No new tracings.    Labs    CBC Recent Labs    07/31/18 0500 07/09/2018 0241  WBC 11.8* 12.6*  HGB 9.0* 9.2*  HCT 29.0* 30.4*  MCV 70.0* 70.4*  PLT 184 161    Basic Metabolic Panel Recent Labs    07/31/18 0500  07/31/18 1515 07/24/2018 0241 07/31/2018 0243  NA 132*   < > 131*  --  132*  K 4.2   < > 4.3  --  3.8  CL 96*   < > 96*  --  97*  CO2 24   < > 23  --  24  GLUCOSE 116*   < > 127*  --  135*  BUN 34*   < > 29*  --  24*  CREATININE 2.03*   < > 2.04*  --  2.17*  CALCIUM 7.9*   < > 8.0*  --  8.2*  MG 2.8*  --   --  2.8*  --   PHOS 2.7   < > 2.6  --  2.2*   < > = values in this interval not displayed.   Liver Function Tests Recent Labs    07/30/18 0735  07/31/18 0500  07/31/18 1515 07/16/2018 0243  AST 284*  --  187*  --   --   --   ALT 767*  --  640*  --   --   --   ALKPHOS 78  --  79  --   --   --   BILITOT 3.0*  --  2.7*  --   --   --   PROT 6.7  --  6.5  --   --   --  ALBUMIN 3.1*   < > 3.1*   < > 3.1* 3.1*   < > = values in this interval not displayed.   No results for input(s): LIPASE, AMYLASE in the last 72 hours. Cardiac Enzymes No results for input(s): CKTOTAL, CKMB, CKMBINDEX, TROPONINI in the last 72 hours.  BNP: BNP (last 3 results) Recent Labs    07/30/2018 1651  BNP 4,188.4*    ProBNP (last 3 results) No results for input(s): PROBNP in the last 8760 hours.   D-Dimer No results for input(s): DDIMER in the last 72 hours. Hemoglobin A1C No results for input(s): HGBA1C in the last 72 hours. Fasting Lipid Panel No results for input(s): CHOL, HDL, LDLCALC, TRIG, CHOLHDL, LDLDIRECT in the last 72 hours. Thyroid Function Tests No results for input(s): TSH, T4TOTAL, T3FREE, THYROIDAB in the last 72 hours.  Invalid input(s): FREET3  Other results:   Imaging    No results found.   Medications:     Scheduled Medications: . [MAR Hold] aspirin EC  81 mg Oral Daily  . [MAR Hold] Chlorhexidine Gluconate Cloth  6 each Topical Daily  . [MAR Hold] mouth rinse  15 mL Mouth Rinse BID  . [MAR Hold] pantoprazole (PROTONIX) IV  40 mg Intravenous BID  . sodium chloride flush  3 mL Intravenous Q12H     Infusions: .  prismasol BGK 4/2.5 400 mL/hr at 07/16/2018 0514  .  prismasol BGK 4/2.5 200 mL/hr at 07/30/2018 0515  . sodium chloride    . sodium chloride    . amiodarone 60 mg/hr (07/22/2018 0800)  . [MAR Hold] ferumoxytol Stopped (07/29/18 1413)  . heparin Stopped (07/24/2018 0745)  . milrinone    . [MAR Hold] norepinephrine (LEVOPHED) Adult infusion 14 mcg/min (07/21/2018 0800)  . prismasol bgk dialysis solution with potassium 1,800 mL/hr at 07/16/2018 0516    PRN Medications: sodium chloride, [MAR Hold] acetaminophen, [MAR Hold] alteplase, Heparin (Porcine) in NaCl, [MAR Hold] heparin, [MAR Hold] nitroGLYCERIN, [MAR Hold] ondansetron (ZOFRAN) IV, [MAR Hold] sodium chloride, sodium chloride flush   Patient Profile   63 y.o.malewith past medical history of hypertension, hyperlipidemia admitted with recent but late presenting inferior lateral myocardial infarction, cardiogenic shock, acute renal failure and microcytic anemia. Echocardiogram showed severe LV and RV dysfunction and moderate mitral regurgitation.  Assessment/Plan   1. Acute systolic HF with cardiogenic shock due to OOH MI - Echo 1/5 EF 15% severe RV dysfunction and moderate MR - Initial co-ox 28% but this was drawn femorally so inaccurate.  - Coox 50% on milrinone 0.25 mcg/kg/min and NE 14. With incessant NSVT will decrease mirlinone back to 0.125 - CVP 10-12. CRRT pulling -100 cc/hr currently.  - Plan RHC today and change HD cath to RIJ - Continue volume removal with CRRT.  - No ACE/ARB/ARNI or b-blocker with shock and AKI  2. CAD - s/p OOH in inferolateral MI - Initial trop 28. Trended down.  - Having some CP but mostly related to VT - Continue ASA and statin. No b-blocker with shock - Will need cath prior to discharge but would hold until we see if kidneys recover   3. AKI/ESRD with uremia - likely due to ATN and hypoperfusion. Initial BUN/CR 198/3.91 - Baseline creatinine normal several months ago - Renal  following CRT started on 1/25 via RFV cath. Goal -100 cc/hr now.  - Renal u/s 1/26: Mildly echogenic right kidney, compatible with chronic medical renal disease. - Continue heparin. Watch closely for bleeding. Dosing per PharmD.   4.  Microcytic anemia due to IDA - Hgb 9.2 today. MCV 69.6.  - Smear without schistocytes - Has received 2u RBCS - Iron stores low. Given Feraheme - Will eventually need GI w/u - Continue protonix  5. Shock liver - Improving with hemodynamic support.   6. VT - Continue on amiodarone gtt. Keep K > 4.0 Mg > 2.0 - Decrease milrinone. May need Lifevest.  CRITICAL CARE Performed by: Glori Bickers  Total critical care time: 35 minutes  Critical care time was exclusive of separately billable procedures and treating other patients.  Critical care was necessary to treat or prevent imminent or life-threatening deterioration.  Critical care was time spent personally by me (independent of midlevel providers or residents) on the following activities: development of treatment plan with patient and/or surrogate as well as nursing, discussions with consultants, evaluation of patient's response to treatment, examination of patient, obtaining history from patient or surrogate, ordering and performing treatments and interventions, ordering and review of laboratory studies, ordering and review of radiographic studies, pulse oximetry and re-evaluation of patient's condition.    Length of Stay: [place], MD  07/20/2018, 8:44 AM  Advanced Heart Failure Team Pager [phone] (M-F; 7a - 4p)  Please contact [facility] for night-coverage after hours (4p -7a ) and weekends on [email or web address]

## 2018-08-01 NOTE — Interval H&P Note (Signed)
History and Physical Interval Note:  07/22/2018 8:49 AM  Samuel Bates  has presented today for surgery, with the diagnosis of reposition  The various methods of treatment have been discussed with the patient and family. After consideration of risks, benefits and other options for treatment, the patient has consented to  Procedure(s): DIALYSIS/PERMA CATHETER INSERTION (N/A) and RHC as a surgical intervention .  The patient's history has been reviewed, patient examined, no change in status, stable for surgery.  I have reviewed the patient's chart and labs.  Questions were answered to the patient's satisfaction.     Daniel Bensimhon

## 2018-08-01 NOTE — Progress Notes (Addendum)
NAME:  Samuel Bates, MRN:  [id number], DOB:  [birth year], LOS: 4 ADMISSION DATE:  07/17/2018, CONSULTATION DATE:1/25 REFERRING WI:[facility] [place], CHIEF COMPLAINT:Shortness of breath x 5 days  Brief History   63 year old man presented 1/25 with confusion, shortness of breath on exertion, non productive cough for 5 days. Symptoms were proceeded by chest pain which radiated to his left arm and were associated with nausea and vomiting one week prior. EKG showed inferolateral ST elevations and Q waves and he was found to have a troponin that peaked at 28.  Cardiology evaluated and thought this was a late presentation infarct and given acute renal injury with lack of active chest pain the plan was for medical management.   Past Medical History  HTN  HLD  Colon polyps  Current daily cigar smoker   Significant Hospital Events   1/25 - admission   Consults:  PCCM Cards Renal  Procedures:  1/25 R femoral HD catheter 1/26 Radial artery catheter  1/26 IJ CVC  Significant Diagnostic Tests:  1/25 echocardiogram EF 10-15%, mild concentric hypertrophy, restrictive physiology, akinesis of the apical myocardium, moderated mitral regurgitation, moderately dilated left atrium, severely dilated right atrium, moderate tricuspid regurgitation, pulmonary artery pressure 45 mm Hg   1/25 renal US - echogenic right kidney consistent with medical renal disease, left kidney not visualized due to positioning   Micro Data:  1/25 - blood cultures -ngtd MRSA neg   Antimicrobials:  Vanc 1/26>> Cefepime 1/25>>  Interim history/subjective:  Patient had sustained Vtach for 30 seconds overnight which was asymptomatic so the rate of CRRT was reduced.  Mr. Muff tells me that he is not feeling well today, he has chest pain, is feeling short of breath and feels more fatigued.  This morning he went for IJ temporary HD catheter placement   Objective   Blood pressure 100/72, pulse (!)  128, temperature 98 F (36.7 C), temperature source Oral, resp. rate 14, height 6\' 3"  (1.905 m), weight 77.1 kg, SpO2 94 %. CVP:  [9 mmHg-21 mmHg] 15 mmHg      Intake/Output Summary (Last 24 hours) at 07/30/2018 0735 Last data filed at 07/04/2018 0700 Gross per 24 hour  Intake 2305.51 ml  Output 4240 ml  Net -1934.49 ml   Filed Weights   07/30/18 0430 07/31/18 0600 07/10/2018 0500  Weight: 81.5 kg 80.5 kg 77.1 kg    Examination: General: ill appearing, appears uncomfortable HENT: NS  Lungs: lungs are clear, no wheezes or rhonchi  Cardiovascular: elevated rate, regular rhythm, no murmur appreciated  Abdomen: BS +, soft, non tender, non distended  Extremities: warm, no peripheral edema  Neuro: awake and alert, oriented  GU: foley in place, there is no urine output   Resolved Hospital Problem list     Assessment & Plan:   Cardiogenic shock secondary to ischemic cardiomyopathy and Systolic congestive heart failure  Shock liver  Unknown baseline cardiac function, no prior echo available  Map 65-70s overnight  - levophed to keep map > 65  - milrinone  - obtain cortisol   Subacute inferior lateral myocardial infarction  - heparin, aspirin  - holding off on statin until transaminase improve  - holding off on beta blocker, ACE/ARB until shock and renal failure improve  - cardiology following, planning on cath when renal function is improved   Hypoxic respiratory failure Weaned to 1L [place] yesterday -continue to wean supplemental oxygen to maintain SpO2 94%   Acute renal failure, anuric  Hypervolemic hyponatremia  -  diuresing with CRRT, net negative 2 L yesterday and 6 L since the time of admission  Episode of Vtach  - on amio - avoiding higher rates of CRRT   Microcytic anemia 2/2 GI bleed  Hx of colon adenomas  - hemoglobin stable at 9, post 1 U PRBC this admission and one feraheme  - continue protonix 40 mg BID  - transfuse for hemoglobin <8 given acute coronary  syndrome   Hypocalcemia  - following ionized calcium  Hx of HTN  - continue to hold home amlodipine and hydralazine   Best practice:  Diet: Heart healthy  Pain/Anxiety/Delirium protocol (if indicated): NA  VAP protocol (if indicated): NA DVT prophylaxis: heparin gtt  GI prophylaxis: po protonix  Glucose control: CBG monitoring  Mobility: up as tolerated  Code Status: full code  Family Communication: will attempt to update family at bedside  Disposition:   Labs   CBC: Recent Labs  Lab 07/04/2018 1651  07/29/18 0653 07/29/18 1607 07/30/18 0420 07/31/18 0500 07/08/2018 0241  WBC  --    < > 8.9 9.4 12.0* 11.8* 12.6*  NEUTROABS 78.6*  --   --   --   --   --   --   HGB  --    < > 8.3* 9.0* 9.2* 9.0* 9.2*  HCT  --    < > 27.1* 29.5* 30.5* 29.0* 30.4*  MCV  --    < > 68.1* 68.9* 69.6* 70.0* 70.4*  PLT 194   < > 186 182 188 184 192   < > = values in this interval not displayed.    Basic Metabolic Panel: Recent Labs  Lab 07/29/18 0150  07/29/18 2008 07/30/18 0420 07/30/18 1537 07/31/18 0500 07/31/18 0743 07/31/18 1515 07/17/2018 0241 07/04/2018 0243  NA  --    < > 131* 131* 131* 132* 132* 131*  --  132*  K  --    < > 4.7 4.4 4.2 4.2 4.2 4.3  --  3.8  CL  --    < > 95* 96* 96* 96* 96* 96*  --  97*  CO2  --    < > 22 22 21* 24 24 23   --  24  GLUCOSE  --    < > 128* 136* 127* 116* 116* 127*  --  135*  BUN  --    < > 88* 68* 48* 34* 33* 29*  --  24*  CREATININE  --    < > 1.80* 1.88* 1.93* 2.03* 1.99* 2.04*  --  2.17*  CALCIUM  --    < > 7.6* 7.7* 7.7* 7.9* 7.9* 8.0*  --  8.2*  MG 3.4*  --  2.9* 2.9*  --  2.8*  --   --  2.8*  --   PHOS  --    < >  --  3.2 3.1 2.7 2.7 2.6  --  2.2*   < > = values in this interval not displayed.   GFR: Estimated Creatinine Clearance: 38.5 mL/min (A) (by C-G formula based on SCr of 2.17 mg/dL (H)). Recent Labs  Lab 07/21/2018 1651 07/27/2018 1908  07/29/18 0416  07/29/18 1607 07/30/18 0420 07/31/18 0500 07/31/2018 0241  WBC  --   --    < >   --    < > 9.4 12.0* 11.8* 12.6*  LATICACIDVEN 2.2* 2.3*  --  2.1*  --   --   --   --   --    < > =  values in this interval not displayed.    Liver Function Tests: Recent Labs  Lab 07/17/2018 1651  07/29/18 0653  07/30/18 0735 07/30/18 1537 07/31/18 0500 07/31/18 0743 07/31/18 1515 07/07/2018 0243  AST 1,103*  --  756*  --  284*  --  187*  --   --   --   ALT 1,058*  --  945*  --  767*  --  640*  --   --   --   ALKPHOS 86  --  78  --  78  --  79  --   --   --   BILITOT 3.0*  --  3.4*  --  3.0*  --  2.7*  --   --   --   PROT 7.2  --  6.4*  --  6.7  --  6.5  --   --   --   ALBUMIN 3.6   < > 3.4*   < > 3.1* 3.2* 3.1* 3.1* 3.1* 3.1*   < > = values in this interval not displayed.   No results for input(s): LIPASE, AMYLASE in the last 168 hours. Recent Labs  Lab 07/13/2018 1908  AMMONIA 21    ABG    Component Value Date/Time   O2SAT 50.4 07/14/2018 0230     Coagulation Profile: Recent Labs  Lab 07/06/2018 1651  INR 1.81  1.83    Cardiac Enzymes: Recent Labs  Lab 07/04/2018 1651 07/22/2018 2049 07/29/18 0150 07/29/18 0653  CKTOTAL 608*  --   --   --   TROPONINI  --  38.76* 28.85* 24.43*    HbA1C: No results found for: HGBA1C  CBG: Recent Labs  Lab 07/08/2018 1532  GLUCAP 148*    Review of Systems:   Feeling fatigued, chest pain, and shortness of breath today.   Past Medical History  He,  has a past medical history of Allergy, adenomatous colonic polyps (03/07/2017), Hyperlipidemia, and Hypertension.   Surgical History    Past Surgical History:  Procedure Laterality Date  . COLONOSCOPY    . POLYPECTOMY    . TONSILLECTOMY     age 72     Social History   reports that he has been smoking cigars. He has never used smokeless tobacco. He reports current alcohol use of about 2.0 standard drinks of alcohol per week. He reports current drug use. Drug: Marijuana.   Family History   His family history includes COPD in his father and sister; Colon polyps in his brother;  Stroke in his brother. There is no history of Colon cancer, Esophageal cancer, Rectal cancer, Stomach cancer, Diabetes, or Colitis.   Allergies Allergies  Allergen Reactions  . Penicillins Hives    Did it involve swelling of the face/tongue/throat, SOB, or low BP? YES Did it involve sudden or severe rash/hives, skin peeling, or any reaction on the inside of your mouth or nose? NO Did you need to seek medical attention at a hospital or doctor's office? YES When did it last happen? 30 years ago If all above answers are "NO", may proceed with cephalosporin use.  . Simvastatin Other (See Comments)    insomnia     Home Medications  Prior to Admission medications   Medication Sig Start Date End Date Taking? Authorizing Provider  amLODipine (NORVASC) 10 MG tablet Take 10 mg by mouth daily.   Yes [provider]  atorvastatin (LIPITOR) 40 MG tablet Take 40 mg by mouth daily.   Yes [provider]  Cetirizine HCl 10 MG CAPS Take 1 capsule by mouth as needed (allergies).    Yes [provider]  hydrochlorothiazide (HYDRODIURIL) 25 MG tablet Take 25 mg by mouth daily.   Yes [provider]  losartan (COZAAR) 50 MG tablet Take 50 mg by mouth daily.   Yes [provider]  omega-3 fish oil (MAXEPA) 1000 MG CAPS capsule Take 2 capsules by mouth daily.   Yes [provider]    Attending Note:  63 year old male with history of an MI presenting to PCCM with pulmonary edema, hypoxemia and cardiogenic shock and now renal failure.  Going to the cath lab for HD catheter placement, remains on pressors, but do not require intubation at this time.  On exam, bibasilar crackles.  I reviewed CXR myself, pulmonary edema noted.  Discussed with PCCM-NP.  Continue amiodarone, milrinone and levophed.  CRRT for volume reduction.  Titrate O2 for sat of 88-92%.  PCCM will continue to follow.  The patient is critically ill with multiple organ systems failure and  requires high complexity decision making for assessment and support, frequent evaluation and titration of therapies, application of advanced monitoring technologies and extensive interpretation of multiple databases.   Critical Care Time devoted to patient care services described in this note is  31  Minutes. This time reflects time of care of this signee Dr Jennet Maduro. This critical care time does not reflect procedure time, or teaching time or supervisory time of PA/NP/Med student/Med Resident etc but could involve care discussion time.  Rush Farmer, M.D. [facility] Pulmonary/Critical Care Medicine. Pager: [phone]. After hours pager: [phone].

## 2018-08-01 NOTE — Progress Notes (Signed)
ANTICOAGULATION CONSULT NOTE - [place] for heparin Indication: Chest pain/ACS  Allergies  Allergen Reactions  . Penicillins Hives    Did it involve swelling of the face/tongue/throat, SOB, or low BP? YES Did it involve sudden or severe rash/hives, skin peeling, or any reaction on the inside of your mouth or nose? NO Did you need to seek medical attention at a hospital or doctor's office? YES When did it last happen? 30 years ago If all above answers are "NO", may proceed with cephalosporin use.  . Simvastatin Other (See Comments)    insomnia    Patient Measurements: Height: 6\' 3"  (190.5 cm) Weight: 169 lb 15.6 oz (77.1 kg) IBW/kg (Calculated) : 84.5 Heparin Dosing Weight: 89.8 kg  Vital Signs: Temp: 98 F (36.7 C) (01/29 0729) Temp Source: Oral (01/29 0729) BP: 100/72 (01/29 0700) Pulse Rate: 128 (01/29 0700)  Labs: Recent Labs    07/30/18 0420 07/30/18 0735  07/31/18 0500 07/31/18 0743 07/31/18 1515 07/06/2018 0241 07/11/2018 0243  HGB 9.2*  --   --  9.0*  --   --  9.2*  --   HCT 30.5*  --   --  29.0*  --   --  30.4*  --   PLT 188  --   --  184  --   --  192  --   APTT 85*  --   --  81*  --   --  94*  --   HEPARINUNFRC  --  0.56  --   --  0.37  --   --  0.42  CREATININE 1.88*  --    < > 2.03* 1.99* 2.04*  --  2.17*   < > = values in this interval not displayed.    Estimated Creatinine Clearance: 38.5 mL/min (A) (by C-G formula based on SCr of 2.17 mg/dL (H)).   Medical History: Past Medical History:  Diagnosis Date  . Allergy   . Hx of adenomatous colonic polyps 03/07/2017  . Hyperlipidemia   . Hypertension    Assessment: 63 yo male admitted with chest pain and r/o NSTEMI/ACS. MD wishes to start heparin drip. No anticoagulation noted PTA.    Heparin level therapeutic this morning, H/H low but stable. Will discuss LOT for heparin therapy with HF team.  Goal of Therapy:  Heparin level 0.3-0.7 units/ml Monitor platelets by  anticoagulation protocol: Yes   Plan:  -Continue heparin 1500 units/hr -Daily heparin level and CBC  Arrie Senate, PharmD, BCPS Clinical Pharmacist [phone] Please check AMION for all [facility] Pharmacy numbers 07/07/2018

## 2018-08-01 NOTE — Plan of Care (Signed)
  Problem: Education: Goal: Knowledge of General Education information will improve Description Including pain rating scale, medication(s)/side effects and non-pharmacologic comfort measures Outcome: Progressing   Problem: Clinical Measurements: Goal: Ability to maintain clinical measurements within normal limits will improve Outcome: Progressing Goal: Will remain free from infection Outcome: Progressing Goal: Diagnostic test results will improve Outcome: Progressing Goal: Respiratory complications will improve Outcome: Progressing   Problem: Safety: Goal: Ability to remain free from injury will improve Outcome: Progressing   Problem: Education: Goal: Ability to verbalize understanding of medication therapies will improve Outcome: Progressing

## 2018-08-01 NOTE — Progress Notes (Signed)
ANTICOAGULATION CONSULT NOTE - [place] for heparin Indication: Chest pain/ACS  Allergies  Allergen Reactions  . Penicillins Hives    Did it involve swelling of the face/tongue/throat, SOB, or low BP? YES Did it involve sudden or severe rash/hives, skin peeling, or any reaction on the inside of your mouth or nose? NO Did you need to seek medical attention at a hospital or doctor's office? YES When did it last happen? 30 years ago If all above answers are "NO", may proceed with cephalosporin use.  . Simvastatin Other (See Comments)    insomnia    Patient Measurements: Height: 6\' 3"  (190.5 cm) Weight: 169 lb 15.6 oz (77.1 kg) IBW/kg (Calculated) : 84.5 Heparin Dosing Weight: 89.8 kg  Vital Signs: Temp: 98 F (36.7 C) (01/29 0729) Temp Source: Oral (01/29 0729) BP: 80/68 (01/29 1100) Pulse Rate: 27 (01/29 1100)  Labs: Recent Labs    07/30/18 0420 07/30/18 0735  07/31/18 0500 07/31/18 0743 07/31/18 1515 07/18/2018 0241 07/30/2018 0243  HGB 9.2*  --   --  9.0*  --   --  9.2*  --   HCT 30.5*  --   --  29.0*  --   --  30.4*  --   PLT 188  --   --  184  --   --  192  --   APTT 85*  --   --  81*  --   --  94*  --   HEPARINUNFRC  --  0.56  --   --  0.37  --   --  0.42  CREATININE 1.88*  --    < > 2.03* 1.99* 2.04*  --  2.17*   < > = values in this interval not displayed.    Estimated Creatinine Clearance: 38.5 mL/min (A) (by C-G formula based on SCr of 2.17 mg/dL (H)).   Medical History: Past Medical History:  Diagnosis Date  . Allergy   . Hx of adenomatous colonic polyps 03/07/2017  . Hyperlipidemia   . Hypertension    Assessment: 63 yo male admitted with chest pain and r/o NSTEMI/ACS. MD wishes to start heparin drip. No anticoagulation noted PTA.    Heparin level therapeutic this morning, H/H low but stable.   Goal of Therapy:  Heparin level 0.3-0.7 units/ml Monitor platelets by anticoagulation protocol: Yes   Plan:  -Continue  heparin 1500 units/hr -Daily heparin level and CBC   ADDENDUM: Pt now s/p RHC, pt to resume IV heparin 8hr after sheath pull. Previously therapeutic on 1500 units/hr.  Plan: -Resume heparin with no bolus at 1500 units/hr tonight at 1730 -Check heparin level at [place], PharmD, BCPS Clinical Pharmacist [phone] Please check AMION for all [place] numbers 07/23/2018

## 2018-08-02 ENCOUNTER — Encounter (HOSPITAL_COMMUNITY): Payer: Self-pay | Admitting: Internal Medicine

## 2018-08-02 ENCOUNTER — Other Ambulatory Visit: Payer: Self-pay

## 2018-08-02 LAB — CBC
HCT: 30.2 % — ABNORMAL LOW (ref 39.0–52.0)
Hemoglobin: 9 g/dL — ABNORMAL LOW (ref 13.0–17.0)
MCH: 21.2 pg — ABNORMAL LOW (ref 26.0–34.0)
MCHC: 29.8 g/dL — ABNORMAL LOW (ref 30.0–36.0)
MCV: 71.2 fL — ABNORMAL LOW (ref 80.0–100.0)
NRBC: 73.9 % — AB (ref 0.0–0.2)
Platelets: 185 10*3/uL (ref 150–400)
RBC: 4.24 MIL/uL (ref 4.22–5.81)
RDW: 24.3 % — AB (ref 11.5–15.5)
WBC: 12.8 10*3/uL — ABNORMAL HIGH (ref 4.0–10.5)

## 2018-08-02 LAB — RENAL FUNCTION PANEL
Albumin: 3.1 g/dL — ABNORMAL LOW (ref 3.5–5.0)
Albumin: 3.2 g/dL — ABNORMAL LOW (ref 3.5–5.0)
Anion gap: 11 (ref 5–15)
Anion gap: 16 — ABNORMAL HIGH (ref 5–15)
BUN: 23 mg/dL (ref 8–23)
BUN: 23 mg/dL (ref 8–23)
CO2: 24 mmol/L (ref 22–32)
CO2: 21 mmol/L — ABNORMAL LOW (ref 22–32)
Calcium: 8.1 mg/dL — ABNORMAL LOW (ref 8.9–10.3)
Calcium: 8.4 mg/dL — ABNORMAL LOW (ref 8.9–10.3)
Chloride: 97 mmol/L — ABNORMAL LOW (ref 98–111)
Chloride: 92 mmol/L — ABNORMAL LOW (ref 98–111)
Creatinine, Ser: 2.99 mg/dL — ABNORMAL HIGH (ref 0.61–1.24)
Creatinine, Ser: 2.98 mg/dL — ABNORMAL HIGH (ref 0.61–1.24)
GFR calc Af Amer: 25 mL/min — ABNORMAL LOW (ref >60)
GFR calc Af Amer: 25 mL/min — ABNORMAL LOW (ref >60)
GFR calc non Af Amer: 21 mL/min — ABNORMAL LOW (ref >60)
GFR calc non Af Amer: 21 mL/min — ABNORMAL LOW (ref >60)
Glucose, Bld: 137 mg/dL — ABNORMAL HIGH (ref 70–99)
Glucose, Bld: 147 mg/dL — ABNORMAL HIGH (ref 70–99)
Phosphorus: 2.8 mg/dL (ref 2.5–4.6)
Phosphorus: 4.2 mg/dL (ref 2.5–4.6)
Potassium: 4.2 mmol/L (ref 3.5–5.1)
Potassium: 4.5 mmol/L (ref 3.5–5.1)
SODIUM: 129 mmol/L — AB (ref 135–145)
Sodium: 132 mmol/L — ABNORMAL LOW (ref 135–145)

## 2018-08-02 LAB — COOXEMETRY PANEL
CARBOXYHEMOGLOBIN: 1.6 % — AB (ref 0.5–1.5)
CARBOXYHEMOGLOBIN: 1.4 % (ref 0.5–1.5)
METHEMOGLOBIN: 1.6 % — AB (ref 0.0–1.5)
Methemoglobin: 1.4 % (ref 0.0–1.5)
O2 Saturation: 40.7 %
O2 Saturation: 41.8 %
Total hemoglobin: 9.6 g/dL — ABNORMAL LOW (ref 12.0–16.0)
Total hemoglobin: 9.7 g/dL — ABNORMAL LOW (ref 12.0–16.0)

## 2018-08-02 LAB — POCT I-STAT EG7
Acid-Base Excess: 1 mmol/L (ref 0.0–2.0)
Bicarbonate: 25.7 mmol/L (ref 20.0–28.0)
Calcium, Ion: 1.06 mmol/L — ABNORMAL LOW (ref 1.15–1.40)
HCT: 32 % — ABNORMAL LOW (ref 39.0–52.0)
Hemoglobin: 10.9 g/dL — ABNORMAL LOW (ref 13.0–17.0)
O2 SAT: 57 %
PCO2 VEN: 41.9 mmHg — AB (ref 44.0–60.0)
Potassium: 4 mmol/L (ref 3.5–5.1)
Sodium: 133 mmol/L — ABNORMAL LOW (ref 135–145)
TCO2: 27 mmol/L (ref 22–32)
pH, Ven: 7.397 (ref 7.250–7.430)
pO2, Ven: 30 mmHg — CL (ref 32.0–45.0)

## 2018-08-02 LAB — CULTURE, BLOOD (ROUTINE X 2)
Culture: NO GROWTH
Culture: NO GROWTH
Special Requests: ADEQUATE

## 2018-08-02 LAB — HEPATIC FUNCTION PANEL
ALT: 444 U/L — ABNORMAL HIGH (ref 0–44)
AST: 140 U/L — ABNORMAL HIGH (ref 15–41)
Albumin: 3.2 g/dL — ABNORMAL LOW (ref 3.5–5.0)
Alkaline Phosphatase: 85 U/L (ref 38–126)
BILIRUBIN INDIRECT: 1.6 mg/dL — AB (ref 0.3–0.9)
Bilirubin, Direct: 0.7 mg/dL — ABNORMAL HIGH (ref 0.0–0.2)
Total Bilirubin: 2.3 mg/dL — ABNORMAL HIGH (ref 0.3–1.2)
Total Protein: 6.6 g/dL (ref 6.5–8.1)

## 2018-08-02 LAB — APTT: aPTT: 73 seconds — ABNORMAL HIGH (ref 24–36)

## 2018-08-02 LAB — LIDOCAINE LEVEL: Lidocaine Lvl: 6.4 ug/mL (ref 1.5–5.0)

## 2018-08-02 LAB — MAGNESIUM: Magnesium: 2.6 mg/dL — ABNORMAL HIGH (ref 1.7–2.4)

## 2018-08-02 LAB — HEPARIN LEVEL (UNFRACTIONATED)
HEPARIN UNFRACTIONATED: 0.34 [IU]/mL (ref 0.30–0.70)
Heparin Unfractionated: 0.31 IU/mL (ref 0.30–0.70)

## 2018-08-02 LAB — CALCIUM, IONIZED: Calcium, Ionized, Serum: 4.4 mg/dL — ABNORMAL LOW (ref 4.5–5.6)

## 2018-08-02 MED ORDER — DOBUTAMINE IN D5W 4-5 MG/ML-% IV SOLN
3.0000 ug/kg/min | INTRAVENOUS | Status: DC
  Administered 2018-08-02: 3 ug/kg/min via INTRAVENOUS
  Filled 2018-08-02: qty 250

## 2018-08-02 MED ORDER — MEXILETINE HCL 150 MG PO CAPS
150.0000 mg | ORAL_CAPSULE | Freq: Three times a day (TID) | ORAL | Status: DC
  Administered 2018-08-02: 150 mg via ORAL
  Filled 2018-08-02 (×2): qty 1

## 2018-08-02 MED ORDER — DARBEPOETIN ALFA 200 MCG/0.4ML IJ SOSY
200.0000 ug | PREFILLED_SYRINGE | INTRAMUSCULAR | Status: DC
  Administered 2018-08-02: 200 ug via SUBCUTANEOUS
  Filled 2018-08-02 (×4): qty 0.4

## 2018-08-02 MED ORDER — PANTOPRAZOLE SODIUM 40 MG PO TBEC
40.0000 mg | DELAYED_RELEASE_TABLET | Freq: Two times a day (BID) | ORAL | Status: DC
  Administered 2018-08-02 – 2018-08-20 (×34): 40 mg via ORAL
  Filled 2018-08-02 (×37): qty 1

## 2018-08-02 MED ORDER — MEXILETINE HCL 150 MG PO CAPS
150.0000 mg | ORAL_CAPSULE | Freq: Three times a day (TID) | ORAL | Status: DC
  Administered 2018-08-02 – 2018-08-20 (×52): 150 mg via ORAL
  Filled 2018-08-02 (×57): qty 1

## 2018-08-02 MED ORDER — HYDROCORTISONE NA SUCCINATE PF 100 MG IJ SOLR
50.0000 mg | Freq: Four times a day (QID) | INTRAMUSCULAR | Status: DC
  Administered 2018-08-02 – 2018-08-05 (×12): 50 mg via INTRAVENOUS
  Filled 2018-08-02 (×12): qty 2

## 2018-08-02 NOTE — Progress Notes (Signed)
ANTICOAGULATION CONSULT NOTE - [place] for Heparin Indication: Chest pain/ACS  Allergies  Allergen Reactions  . Penicillins Hives    Did it involve swelling of the face/tongue/throat, SOB, or low BP? YES Did it involve sudden or severe rash/hives, skin peeling, or any reaction on the inside of your mouth or nose? NO Did you need to seek medical attention at a hospital or doctor's office? YES When did it last happen? 30 years ago If all above answers are "NO", may proceed with cephalosporin use.  . Simvastatin Other (See Comments)    insomnia    Patient Measurements: Height: 6\' 3"  (190.5 cm) Weight: 169 lb 15.6 oz (77.1 kg) IBW/kg (Calculated) : 84.5 Heparin Dosing Weight: 89.8 kg  Vital Signs: Temp: 97.6 F (36.4 C) (01/30 0000) Temp Source: Oral (01/30 0000) BP: 92/68 (01/30 0215) Pulse Rate: 79 (01/30 0215)  Labs: Recent Labs    07/30/18 0420  07/31/18 0500 07/31/18 0743 07/31/18 1515 07/16/2018 0241 07/31/2018 0243 07/07/2018 0908 07/06/2018 1600 08/02/18 0215  HGB 9.2*  --  9.0*  --   --  9.2*  --  10.2*  --   --   HCT 30.5*  --  29.0*  --   --  30.4*  --  30.0*  --   --   PLT 188  --  184  --   --  192  --   --   --   --   APTT 85*  --  81*  --   --  94*  --   --   --   --   HEPARINUNFRC  --    < >  --  0.37  --   --  0.42  --   --  0.31  CREATININE 1.88*   < > 2.03* 1.99* 2.04*  --  2.17*  --  2.61*  --    < > = values in this interval not displayed.    Estimated Creatinine Clearance: 32 mL/min (A) (by C-G formula based on SCr of 2.61 mg/dL (H)).   Medical History: Past Medical History:  Diagnosis Date  . Allergy   . Hx of adenomatous colonic polyps 03/07/2017  . Hyperlipidemia   . Hypertension    Assessment: 63 yo male admitted with chest pain and r/o NSTEMI/ACS. MD wishes to start heparin drip. No anticoagulation noted PTA.    1/29 PM: Pt now s/p RHC, pt to resume IV heparin 8hr after sheath pull. Previously therapeutic  on 1500 units/hr.  1/30 AM update: heparin level therapeutic x 1 after re-start  Goal of Therapy:  Heparin level 0.3-0.7 units/ml Monitor platelets by anticoagulation protocol: Yes   Plan:  -Continue heparin 1500 units/hr -Confirmatory heparin level at [place], PharmD, [place] Pharmacist Phone: [phone]

## 2018-08-02 NOTE — Progress Notes (Addendum)
Advanced Heart Failure Rounding Note  PCP-Cardiologist: No primary care provider on file.   Subjective:    1/26 Had VT twice ~1915 for 1-2 minutes. Started on amio. Re-bolused due to 8 minute run ~2300   Started on lidocaine 07/17/2018 with very frequent NSVT/Ectopy with. Level high at 6.7 this am.   Remains anuric. CRRT pulling ~100 cc/hr. Awake and alert. Denies CP or SOB. CVP 14-15  C/o CP with VT  Echo 07/17/2018 LVEF 15%, with severe RV failure.   RHC 08/02/2018 On milrinone 0.125 and NE 14  RA = 13 RV = 51/18 PA = 50/24 (34) PCW = 25 (v = 35) Fick cardiac output/index = 5.42/2.59 Thermo CO/CI = 5.49/2.63 PVR = 1.6 WU FA sat = 97% PA sat = 54%, 58%  Objective:   Weight Range: 74.8 kg Body mass index is 20.61 kg/m.   Vital Signs:   Temp:  [97.6 F (36.4 C)-98.5 F (36.9 C)] 98.2 F (36.8 C) (01/30 0400) Pulse Rate:  [27-128] 77 (01/30 0645) Resp:  [12-32] 23 (01/30 0645) BP: (80-118)/(45-84) 97/79 (01/30 0645) SpO2:  [90 %-99 %] 94 % (01/30 0645) Arterial Line BP: (90-92)/(45-52) 90/45 (01/29 0815) Weight:  [74.8 kg] 74.8 kg (01/30 0500) Last BM Date: 07/31/18  Weight change: Filed Weights   07/31/18 0600 07/31/2018 0500 08/02/18 0500  Weight: 80.5 kg 77.1 kg 74.8 kg   Intake/Output:   Intake/Output Summary (Last 24 hours) at 08/02/2018 0745 Last data filed at 08/02/2018 0700 Gross per 24 hour  Intake 2597.07 ml  Output 3937 ml  Net -1339.93 ml    Physical Exam    General: NAD HEENT: Normal Neck: Supple. JVP to jaw. Carotids 2+ bilat; no bruits. No thyromegaly or nodule noted. RIJ trialysis cath Cor: PMI nondisplaced. RRR, No M/G/R noted Lungs: CTAB, normal effort. Abdomen: Soft, non-tender, non-distended, no HSM. No bruits or masses. +BS  Extremities: No cyanosis, clubbing, or rash. RFV site stable.  Neuro: Alert & orientedx3, cranial nerves grossly intact. moves all 4 extremities w/o difficulty. Affect pleasant   Telemetry   NSR 70-80  with far less frequent ectopy on lidocaine, personally reviewed.   EKG    No new tracings.    Labs    CBC Recent Labs    07/11/2018 0241 07/15/2018 0908 08/02/18 0359  WBC 12.6*  --  12.8*  HGB 9.2* 10.9*  10.2* 9.0*  HCT 30.4* 32.0*  30.0* 30.2*  MCV 70.4*  --  71.2*  PLT 192  --  833   Basic Metabolic Panel Recent Labs    07/31/2018 0241  07/25/2018 1600 08/02/18 0359  NA  --    < > 128* 132*  K  --    < > 4.0 4.2  CL  --    < > 91* 97*  CO2  --    < > 23 24  GLUCOSE  --    < > 145* 137*  BUN  --    < > 24* 23  CREATININE  --    < > 2.61* 2.99*  CALCIUM  --    < > 8.1* 8.1*  MG 2.8*  --   --  2.6*  PHOS  --    < > 2.6 2.8   < > = values in this interval not displayed.   Liver Function Tests Recent Labs    07/31/18 0500  07/07/2018 1600 08/02/18 0359  AST 187*  --   --  140*  ALT 640*  --   --  444*  ALKPHOS 79  --   --  85  BILITOT 2.7*  --   --  2.3*  PROT 6.5  --   --  6.6  ALBUMIN 3.1*   < > 3.0* 3.1*  3.2*   < > = values in this interval not displayed.   No results for input(s): LIPASE, AMYLASE in the last 72 hours. Cardiac Enzymes No results for input(s): CKTOTAL, CKMB, CKMBINDEX, TROPONINI in the last 72 hours.  BNP: BNP (last 3 results) Recent Labs    08/03/2018 1651  BNP 4,188.4*    ProBNP (last 3 results) No results for input(s): PROBNP in the last 8760 hours.   D-Dimer No results for input(s): DDIMER in the last 72 hours. Hemoglobin A1C No results for input(s): HGBA1C in the last 72 hours. Fasting Lipid Panel No results for input(s): CHOL, HDL, LDLCALC, TRIG, CHOLHDL, LDLDIRECT in the last 72 hours. Thyroid Function Tests No results for input(s): TSH, T4TOTAL, T3FREE, THYROIDAB in the last 72 hours.  Invalid input(s): FREET3  Other results:   Imaging    No results found.   Medications:     Scheduled Medications: . aspirin EC  81 mg Oral Daily  . Chlorhexidine Gluconate Cloth  6 each Topical Daily  . mouth rinse  15 mL  Mouth Rinse BID  . pantoprazole (PROTONIX) IV  40 mg Intravenous BID  . sodium chloride flush  3 mL Intravenous Q12H    Infusions: .  prismasol BGK 4/2.5 400 mL/hr at 08/02/18 0532  .  prismasol BGK 4/2.5 200 mL/hr at 07/24/2018 0515  . sodium chloride    . sodium chloride    . amiodarone 60 mg/hr (08/02/18 0700)  . heparin 1,500 Units/hr (08/02/18 0700)  . milrinone 0.125 mcg/kg/min (08/02/18 0700)  . norepinephrine (LEVOPHED) Adult infusion 14 mcg/min (08/02/18 0700)  . prismasol BGK 4/2.5 2,000 mL/hr at 08/02/18 0703    PRN Medications: sodium chloride, Place/Maintain arterial line **AND** sodium chloride, acetaminophen, alteplase, heparin, nitroGLYCERIN, ondansetron (ZOFRAN) IV, sodium chloride, sodium chloride flush   Patient Profile   62 y.o.malewith past medical history of hypertension, hyperlipidemia admitted with recent but late presenting inferior lateral myocardial infarction, cardiogenic shock, acute renal failure and microcytic anemia. Echocardiogram showed severe LV and RV dysfunction and moderate mitral regurgitation.  Assessment/Plan   1. Acute systolic HF with cardiogenic shock due to OOH MI - Echo 1/5 EF 15% severe RV dysfunction and moderate MR - Initial co-ox 28% but this was drawn femorally so inaccurate.  - Coox 40.7% on milrinone 0.125 mcg and NE 14. Repeat pending. With suppression of VT/NSVT, ? If we try to go back up on milrinone.  - CVP 14-15. CRRT pulling -100 cc/hr currently.  - RHC as above with low cardiac output and continued volume overload.  - Continue volume removal with CRRT.  - No ACE/ARB/ARNI or b-blocker with shock and AKI  2. CAD - s/p OOH in inferolateral MI - Initial trop 28, ten trended down.   - Having some CP but mostly related to VT - Continue ASA and statin. No b-blocker with shock - He will need cath prior to discharge but would hold until we see if kidneys recover   3. AKI/ESRD with uremia - likely due to ATN and  hypoperfusion. Initial BUN/CR 198/3.91 - Baseline creatinine normal several months ago - Renal following CRT started on 1/25 via RFV cath. Goal -100 cc for now.  - Renal u/s 1/26: Mildly echogenic right kidney, compatible with chronic  medical renal disease. - Continue heparin. Watch closely for bleeding. Dosing per PharmD.   4. Microcytic anemia due to IDA - Hgb 9.0 today. MCV 71.2. - Smear without schistocytes - Has received 2u RBCS - Iron stores low. Given Feraheme - Will eventually need GI w/u - Continue protonix  5. Shock liver - Improving. with hemodynamic support.   6. VT - Continue on amiodarone gtt. Keep K > 4.0 Mg > 2.0 - Started on lidocaine 07/17/2018 with great suppression. Level high this am. Will discuss with MD. ? Add mexitil and bring off lidocaine.  - Continue milrinone 0.125 mcg/kg/min. Have had worsened VT with higher doses.  - Lifevest has been ordered. Follow prn.   Length of Stay: 5  Annamaria Helling  08/02/2018, 7:45 AM  Advanced Heart Failure Team Pager [phone] (M-F; 7a - 4p)  Please contact [facility] for night-coverage after hours (4p -7a ) and weekends on [email or web address]  Agree with above.  He remains critically ill. Anuric. On CRRT. Remains on NE and milrinone. Co-ox low at 42%. CVP remains elevated. VT suppressed with IV lidocaine  JVP to jaw  RIJ trialysis LIJ TLC Cor RRR + s3 Lungs mild crackles Ab soft NT Ext warm no edema  He is critically ill. Remains on CRRT and dual pressors. Co-ox low. Will switch milrinone to dobutamine and see if that improves output. Ventricular ectopy suppressed on IV lidocaine. Will switch to po mexilitene. Will continue aggressive care hoping for gradual return of renal function.   CRITICAL CARE Performed by: Glori Bickers  Total critical care time: 35 minutes  Critical care time was exclusive of separately billable procedures and treating other patients.  Critical care was necessary to  treat or prevent imminent or life-threatening deterioration.  Critical care was time spent personally by me (independent of midlevel providers or residents) on the following activities: development of treatment plan with patient and/or surrogate as well as nursing, discussions with consultants, evaluation of patient's response to treatment, examination of patient, obtaining history from patient or surrogate, ordering and performing treatments and interventions, ordering and review of laboratory studies, ordering and review of radiographic studies, pulse oximetry and re-evaluation of patient's condition.  Glori Bickers, MD  10:58 AM

## 2018-08-02 NOTE — Progress Notes (Signed)
ANTICOAGULATION CONSULT NOTE - [place] for heparin Indication: Chest pain/ACS  Allergies  Allergen Reactions  . Penicillins Hives    Did it involve swelling of the face/tongue/throat, SOB, or low BP? YES Did it involve sudden or severe rash/hives, skin peeling, or any reaction on the inside of your mouth or nose? NO Did you need to seek medical attention at a hospital or doctor's office? YES When did it last happen? 30 years ago If all above answers are "NO", may proceed with cephalosporin use.  . Simvastatin Other (See Comments)    insomnia    Patient Measurements: Height: 6\' 3"  (190.5 cm) Weight: 164 lb 14.5 oz (74.8 kg) IBW/kg (Calculated) : 84.5 Heparin Dosing Weight: 89.8 kg  Vital Signs: Temp: 97.5 F (36.4 C) (01/30 1141) Temp Source: Axillary (01/30 1141) BP: 87/67 (01/30 1230) Pulse Rate: 80 (01/30 1230)  Labs: Recent Labs    07/31/18 0500  07/05/2018 0241 07/13/2018 0243 07/31/2018 0908 07/31/2018 1600 08/02/18 0215 08/02/18 0359 08/02/18 1209  HGB 9.0*  --  9.2*  --  10.9*  10.2*  --   --  9.0*  --   HCT 29.0*  --  30.4*  --  32.0*  30.0*  --   --  30.2*  --   PLT 184  --  192  --   --   --   --  185  --   APTT 81*  --  94*  --   --   --   --  73*  --   HEPARINUNFRC  --    < >  --  0.42  --   --  0.31  --  0.34  CREATININE 2.03*   < >  --  2.17*  --  2.61*  --  2.99*  --    < > = values in this interval not displayed.    Estimated Creatinine Clearance: 27.1 mL/min (A) (by C-G formula based on SCr of 2.99 mg/dL (H)).   Medical History: Past Medical History:  Diagnosis Date  . Allergy   . Hx of adenomatous colonic polyps 03/07/2017  . Hyperlipidemia   . Hypertension    Assessment: 63 yo male admitted with chest pain and r/o NSTEMI/ACS. MD wishes to start heparin drip. No anticoagulation noted PTA.    Heparin level therapeutic this morning, Hgb low but overall stable.  Goal of Therapy:  Heparin level 0.3-0.7  units/ml Monitor platelets by anticoagulation protocol: Yes   Plan:  -Continue heparin 1500 units/hr -Daily heparin level and CBC   Arrie Senate, PharmD, BCPS Clinical Pharmacist [phone] Please check AMION for all [facility] numbers 08/02/2018

## 2018-08-02 NOTE — Progress Notes (Signed)
[facility] ROUNDING NOTE   Subjective:   Acute systolic heart failure with cardiogenic shock ejection fraction 15% with severe RV dysfunction.  Currently receiving CRRT removing 100 cc/h.  Creatinine unclear baseline 3.91 on admission 07/14/2018   Sodium 132 potassium 4.2 chloride 97 CO2 24 BUN 23 creatinine 2.99 glucose 137 calcium 8.1 phosphorus 2.8 magnesium 2.6 AST 140 ALT 444  WBC 12.8 hemoglobin 9.0 platelets 185   Blood pressure 93/79 pulse 77 O2 sats 96% 2 L nasal cannula temperature 98.2  IV milrinone  IV amiodarone  IV lidocaine  IV norepinephrine  Fluid removal 3.6 L net -1 L weight 74.8.  Objective:  Vital signs in last 24 hours:  Temp:  [97.6 F (36.4 C)-98.5 F (36.9 C)] 98.2 F (36.8 C) (01/30 0400) Pulse Rate:  [27-128] 78 (01/30 0700) Resp:  [12-32] 20 (01/30 0800) BP: (80-118)/(45-84) 96/77 (01/30 0800) SpO2:  [90 %-99 %] 97 % (01/30 0700) Weight:  [74.8 kg] 74.8 kg (01/30 0500)  Weight change: -2.3 kg Filed Weights   07/31/18 0600 07/04/2018 0500 08/02/18 0500  Weight: 80.5 kg 77.1 kg 74.8 kg    Intake/Output: I/O last 3 completed shifts: In: 3574.9 [P.O.:1077; I.V.:2380.9; IV Piggyback:117] Out: 5965 [Other:5965]   Intake/Output this shift:  Total I/O In: 261.4 [P.O.:200; I.V.:61.4] Out: 279 [Other:279]  CVS- RRR JVP to jaw 2+ carotid bruits no thyromegaly right IJ dialysis catheter RS- CTA no wheezes diminished breath sounds ABD-soft nontender nondistended bowel sounds present EXT- no edema   Basic Metabolic Panel: Recent Labs  Lab 07/29/18 2008 07/30/18 0420  07/31/18 0500 07/31/18 0743 07/31/18 1515 07/04/2018 0241 07/31/2018 0243 07/16/2018 0908 07/26/2018 1600 08/02/18 0359  NA 131* 131*   < > 132* 132* 131*  --  132* 133*  135 128* 132*  K 4.7 4.4   < > 4.2 4.2 4.3  --  3.8 4.0  3.7 4.0 4.2  CL 95* 96*   < > 96* 96* 96*  --  97*  --  91* 97*  CO2 22 22   < > 24 24 23   --  24  --  23 24  GLUCOSE 128* 136*   < >  116* 116* 127*  --  135*  --  145* 137*  BUN 88* 68*   < > 34* 33* 29*  --  24*  --  24* 23  CREATININE 1.80* 1.88*   < > 2.03* 1.99* 2.04*  --  2.17*  --  2.61* 2.99*  CALCIUM 7.6* 7.7*   < > 7.9* 7.9* 8.0*  --  8.2*  --  8.1* 8.1*  MG 2.9* 2.9*  --  2.8*  --   --  2.8*  --   --   --  2.6*  PHOS  --  3.2   < > 2.7 2.7 2.6  --  2.2*  --  2.6 2.8   < > = values in this interval not displayed.    Liver Function Tests: Recent Labs  Lab 07/18/2018 1651  07/29/18 0653  07/30/18 0735  07/31/18 0500 07/31/18 0743 07/31/18 1515 07/06/2018 0243 07/29/2018 1600 08/02/18 0359  AST 1,103*  --  756*  --  284*  --  187*  --   --   --   --  140*  ALT 1,058*  --  945*  --  767*  --  640*  --   --   --   --  444*  ALKPHOS 86  --  78  --  78  --  79  --   --   --   --  85  BILITOT 3.0*  --  3.4*  --  3.0*  --  2.7*  --   --   --   --  2.3*  PROT 7.2  --  6.4*  --  6.7  --  6.5  --   --   --   --  6.6  ALBUMIN 3.6   < > 3.4*   < > 3.1*   < > 3.1* 3.1* 3.1* 3.1* 3.0* 3.1*  3.2*   < > = values in this interval not displayed.   No results for input(s): LIPASE, AMYLASE in the last 168 hours. Recent Labs  Lab 07/17/2018 1908  AMMONIA 21    CBC: Recent Labs  Lab 07/27/2018 1651  07/29/18 1607 07/30/18 0420 07/31/18 0500 07/29/2018 0241 07/14/2018 0908 08/02/18 0359  WBC  --    < > 9.4 12.0* 11.8* 12.6*  --  12.8*  NEUTROABS 78.6*  --   --   --   --   --   --   --   HGB  --    < > 9.0* 9.2* 9.0* 9.2* 10.9*  10.2* 9.0*  HCT  --    < > 29.5* 30.5* 29.0* 30.4* 32.0*  30.0* 30.2*  MCV  --    < > 68.9* 69.6* 70.0* 70.4*  --  71.2*  PLT 194   < > 182 188 184 192  --  185   < > = values in this interval not displayed.    Cardiac Enzymes: Recent Labs  Lab 07/30/2018 1651 07/15/2018 2049 07/29/18 0150 07/29/18 0653  CKTOTAL 608*  --   --   --   TROPONINI  --  38.76* 28.85* 24.43*    BNP: Invalid input(s): POCBNP  CBG: Recent Labs  Lab 07/10/2018 1532  GLUCAP 148*    Microbiology: Results for  orders placed or performed during the hospital encounter of 07/19/2018  Blood culture (routine x 2)     Status: None (Preliminary result)   Collection Time: 07/09/2018  4:50 PM  Result Value Ref Range Status   Specimen Description BLOOD RIGHT FOREARM  Final   Special Requests   Final    AEROBIC BOTTLE ONLY Blood Culture results may not be optimal due to an inadequate volume of blood received in culture bottles   Culture   Final    NO GROWTH 4 DAYS Performed at [facility], [place] [address], [place], [place] [address]    Report Status PENDING  Incomplete  Blood culture (routine x 2)     Status: None (Preliminary result)   Collection Time: 07/22/2018  4:51 PM  Result Value Ref Range Status   Specimen Description BLOOD RIGHT ANTECUBITAL  Final   Special Requests   Final    BOTTLES DRAWN AEROBIC AND ANAEROBIC Blood Culture adequate volume   Culture   Final    NO GROWTH 4 DAYS Performed at [facility], [place] [address] [place], [place] [address]    Report Status PENDING  Incomplete  MRSA PCR Screening     Status: None   Collection Time: 08/02/2018  6:26 PM  Result Value Ref Range Status   MRSA by PCR NEGATIVE NEGATIVE Final    Comment:        The GeneXpert MRSA Assay (FDA approved for NASAL specimens only), is one component of a comprehensive MRSA colonization surveillance program.  It is not intended to diagnose MRSA infection nor to guide or monitor treatment for MRSA infections. Performed at [facility], [place] [address] [place], [place] [address]     Coagulation Studies: No results for input(s): LABPROT, INR in the last 72 hours.  Urinalysis: No results for input(s): COLORURINE, LABSPEC, PHURINE, GLUCOSEU, HGBUR, BILIRUBINUR, KETONESUR, PROTEINUR, UROBILINOGEN, NITRITE, LEUKOCYTESUR in the last 72 hours.  Invalid input(s): APPERANCEUR    Imaging: No results found.   Medications:   .  prismasol BGK 4/2.5 400 mL/hr at 08/02/18 0532  .  prismasol BGK 4/2.5  200 mL/hr at 07/25/2018 0515  . sodium chloride    . sodium chloride    . amiodarone 60 mg/hr (08/02/18 0800)  . DOBUTamine    . heparin 1,500 Units/hr (08/02/18 0800)  . norepinephrine (LEVOPHED) Adult infusion 14 mcg/min (08/02/18 0800)  . prismasol BGK 4/2.5 2,000 mL/hr at 08/02/18 0703   . aspirin EC  81 mg Oral Daily  . Chlorhexidine Gluconate Cloth  6 each Topical Daily  . mouth rinse  15 mL Mouth Rinse BID  . pantoprazole  40 mg Oral BID  . sodium chloride flush  3 mL Intravenous Q12H   sodium chloride, Place/Maintain arterial line **AND** sodium chloride, acetaminophen, alteplase, heparin, nitroGLYCERIN, ondansetron (ZOFRAN) IV, sodium chloride, sodium chloride flush  Assessment/ Plan:   Acute systolic heart failure ejection fraction 15% severe RV dysfunction with cardiogenic shock.  CVP 14-15.  Continues to have fluid removed  Coronary artery disease continues aspirin and statin will need cardiac cath prior to discharge  Acute kidney injury secondary to acute tubular necrosis and hypoperfusion admission creatinine 3.91 unclear baseline although thought to be normal several months ago.  Echogenic right kidney compatible medical renal disease.  Continues on CRRT.  Anemia we will start darbepoetin.  Completed RBC transfusion 07/29/2018  Shock liver continue hemodynamic support  V. tach continue amiodarone lidocaine initiated 07/16/2018.  Hypertension/volume.  Norepinephrine with volume removal through CRRT 100 cc/h.  Anticoagulation IV heparin  Access right IJ hemodialysis cath.   LOS: [place] @TODAY @8 :31 AM

## 2018-08-02 NOTE — Progress Notes (Addendum)
NAME:  Samuel Bates, MRN:  [id number], DOB:  1956/06/15, LOS: 5 ADMISSION DATE:  07/06/2018, CONSULTATION DATE:1/25 REFERRING ZL:DJTT Schlossman, CHIEF COMPLAINT:Shortness of breath x 5 days  Brief History   63 year old man presented 1/25 with shortness of breath found to have severe diastolic heart failure as a result of a remote myocardial infarction evident on EKG with elevated troponin. The MI has been managed medically, course has been complicated by anuric renal failure and ventricular tachycardia.   Past Medical History  HTN  HLD  Colon polyps  Current daily cigar smoker   Significant Hospital Events   1/25 admission, started on heparin gtt and CRRT  1/29 went into sustained ventricular tachycardia, started on lidocaine infusion   Consults:  Heart Failure  Nephrology   Procedures:  1/25 R femoral HD catheter 1/26 Radial artery catheter > 1/29  1/26 IJ CVC 1/29 L IJ hemodialysis catheter    Significant Diagnostic Tests:  1/25 echocardiogram EF 10-15%, mild concentric hypertrophy, restrictive physiology, akinesis of the apical myocardium, moderated mitral regurgitation, moderately dilated left atrium, severely dilated right atrium, moderate tricuspid regurgitation, pulmonary artery pressure 45 mm Hg   1/25 renal US - echogenic right kidney consistent with medical renal disease, left kidney not visualized due to positioning   1/29 right heart cath  RA = 13 RV = 51/18 PA = 50/24 (34) PCW = 25 (v = 35) Fick cardiac output/index = 5.42/2.59 Thermo CO/CI = 5.49/2.63 PVR = 1.6 WU FA sat = 97% PA sat = 54%, 58%  Micro Data:  1/25 - blood cultures -ngtd MRSA neg   Antimicrobials:  Vanc 1/26>>1/27 Cefepime 1/25>>1/27   Interim history/subjective:  Non sustained vtach yesterday, milrinone was reduced, lidocaine given in the cath lab helped to abate this.  Went for right heart cath, PCW pressure 25   Objective   Blood pressure 97/79, pulse 77,  temperature 98.2 F (36.8 C), temperature source Oral, resp. rate (!) 23, height 6\' 3"  (1.905 m), weight 74.8 kg, SpO2 94 %. CVP:  [10 mmHg-27 mmHg] 16 mmHg      Intake/Output Summary (Last 24 hours) at 08/02/2018 0740 Last data filed at 08/02/2018 0700 Gross per 24 hour  Intake 2597.07 ml  Output 3937 ml  Net -1339.93 ml   Filed Weights   07/31/18 0600 07/31/2018 0500 08/02/18 0500  Weight: 80.5 kg 77.1 kg 74.8 kg    Examination: General: ill appearing, not in acute distress  HENT: nasal canula  Lungs: course breath sounds over anterior fields and at the bases  Cardiovascular: regular rate and rhythm, no murmur  Abdomen: soft, non tender, non distended  Extremities: no peripheral edema, warm extremities  Neuro: awake, alert, oriented, moving all extremities  GU: no urine in the foley bag   Resolved Hospital Problem list     Assessment & Plan:   Cardiogenic shock 2/2 ischemic cardiomyopathy and severely reduced systolic function  Shock liver  - levophed to keep map > 65. MAP improved to 80s overnight with improvement in [place], will attempt to wean levophed today  - milrinone   Subacute Inferior Lateral Myocardial infarction   - heparin, aspirin  - holding off on statin, BB, ACE/ARB for shock and renal failure  - following with cardiology   Hypoxic respiratory failure secondary to pulmonary edema  - continue supplemental oxygen with attempt to wean to SpO2 94%   Acute renal failure, anuric   Hypervolemic hyponatremia  - managing with CRRT, net neg 1 L yesterday  8 L since admission   Vtach  - amiodarone  Microcytic anemia 2/2 GI bleed  No further bleeding since time of admission, hemoglobin stable at 9 post one unit PRBC and feraheme earlier in admission.  - protonix 40 mg BID  - transfuse for HGB < 8 given ACS   Best practice:  Diet: renal/ carb mod  Pain/Anxiety/Delirium protocol (if indicated): na  VAP protocol (if indicated): na  DVT prophylaxis: heparin gtt   GI prophylaxis: BID protonix  Glucose control: CBG monitoring  Mobility: bedrest while on CRRT  Code Status: full  Family Communication: cousin and sister updated at bedside, sons visit at 10:30 at night, will attempt to reach the sons via phone today  Disposition: Continue ICU monitoring, still requiring CRRT and pressors   Labs   CBC: Recent Labs  Lab 08/02/2018 1651  07/29/18 1607 07/30/18 0420 07/31/18 0500 07/19/2018 0241 07/25/2018 0908 08/02/18 0359  WBC  --    < > 9.4 12.0* 11.8* 12.6*  --  12.8*  NEUTROABS 78.6*  --   --   --   --   --   --   --   HGB  --    < > 9.0* 9.2* 9.0* 9.2* 10.9*  10.2* 9.0*  HCT  --    < > 29.5* 30.5* 29.0* 30.4* 32.0*  30.0* 30.2*  MCV  --    < > 68.9* 69.6* 70.0* 70.4*  --  71.2*  PLT 194   < > 182 188 184 192  --  185   < > = values in this interval not displayed.    Basic Metabolic Panel: Recent Labs  Lab 07/29/18 2008 07/30/18 0420  07/31/18 0500 07/31/18 0743 07/31/18 1515 07/22/2018 0241 07/20/2018 0243 07/31/2018 0908 07/09/2018 1600 08/02/18 0359  NA 131* 131*   < > 132* 132* 131*  --  132* 133*  135 128* 132*  K 4.7 4.4   < > 4.2 4.2 4.3  --  3.8 4.0  3.7 4.0 4.2  CL 95* 96*   < > 96* 96* 96*  --  97*  --  91* 97*  CO2 22 22   < > 24 24 23   --  24  --  23 24  GLUCOSE 128* 136*   < > 116* 116* 127*  --  135*  --  145* 137*  BUN 88* 68*   < > 34* 33* 29*  --  24*  --  24* 23  CREATININE 1.80* 1.88*   < > 2.03* 1.99* 2.04*  --  2.17*  --  2.61* 2.99*  CALCIUM 7.6* 7.7*   < > 7.9* 7.9* 8.0*  --  8.2*  --  8.1* 8.1*  MG 2.9* 2.9*  --  2.8*  --   --  2.8*  --   --   --  2.6*  PHOS  --  3.2   < > 2.7 2.7 2.6  --  2.2*  --  2.6 2.8   < > = values in this interval not displayed.   GFR: Estimated Creatinine Clearance: 27.1 mL/min (A) (by C-G formula based on SCr of 2.99 mg/dL (H)). Recent Labs  Lab 07/20/2018 1651 07/29/2018 1908  07/29/18 0416  07/30/18 0420 07/31/18 0500 07/27/2018 0241 08/02/18 0359  WBC  --   --    < >  --    < >  12.0* 11.8* 12.6* 12.8*  LATICACIDVEN 2.2* 2.3*  --  2.1*  --   --   --   --   --    < > =  values in this interval not displayed.    Liver Function Tests: Recent Labs  Lab 07/20/2018 1651  07/29/18 0653  07/30/18 0735  07/31/18 0500 07/31/18 0743 07/31/18 1515 07/15/2018 0243 07/12/2018 1600 08/02/18 0359  AST 1,103*  --  756*  --  284*  --  187*  --   --   --   --  140*  ALT 1,058*  --  945*  --  767*  --  640*  --   --   --   --  444*  ALKPHOS 86  --  78  --  78  --  79  --   --   --   --  85  BILITOT 3.0*  --  3.4*  --  3.0*  --  2.7*  --   --   --   --  2.3*  PROT 7.2  --  6.4*  --  6.7  --  6.5  --   --   --   --  6.6  ALBUMIN 3.6   < > 3.4*   < > 3.1*   < > 3.1* 3.1* 3.1* 3.1* 3.0* 3.1*  3.2*   < > = values in this interval not displayed.   No results for input(s): LIPASE, AMYLASE in the last 168 hours. Recent Labs  Lab 07/10/2018 1908  AMMONIA 21    ABG    Component Value Date/Time   HCO3 24.0 07/15/2018 0908   HCO3 25.7 07/23/2018 0908   TCO2 25 07/30/2018 0908   TCO2 27 07/12/2018 0908   ACIDBASEDEF 1.0 07/31/2018 0908   O2SAT 40.7 08/02/2018 0400     Coagulation Profile: Recent Labs  Lab 07/05/2018 1651  INR 1.81  1.83    Cardiac Enzymes: Recent Labs  Lab 07/09/2018 1651 07/10/2018 2049 07/29/18 0150 07/29/18 0653  CKTOTAL 608*  --   --   --   TROPONINI  --  38.76* 28.85* 24.43*    HbA1C: No results found for: HGBA1C  CBG: Recent Labs  Lab 07/17/2018 1532  GLUCAP 148*    Review of Systems:   Denies chest pain, this resolved with improvement in the vtach. Some episodes of difficulty breathing overnight. No improvement in the feeling of fatigue since yesterday.   Past Medical History  He,  has a past medical history of Allergy, adenomatous colonic polyps (03/07/2017), Hyperlipidemia, and Hypertension.   Surgical History    Past Surgical History:  Procedure Laterality Date  . COLONOSCOPY    . DIALYSIS/PERMA CATHETER INSERTION N/A 07/24/2018    Procedure: DIALYSIS/PERMA CATHETER INSERTION;  Surgeon: Jolaine Artist, MD;  Location: [facility];  Service: Cardiovascular;  Laterality: N/A;  . POLYPECTOMY    . RIGHT HEART CATH N/A 07/31/2018   Procedure: RIGHT HEART CATH;  Surgeon: Jolaine Artist, MD;  Location: [facility];  Service: Cardiovascular;  Laterality: N/A;  . TONSILLECTOMY     age 38     Social History   reports that he has been smoking cigars. He has never used smokeless tobacco. He reports current alcohol use of about 2.0 standard drinks of alcohol per week. He reports current drug use. Drug: Marijuana.   Family History   His family history includes COPD in his father and sister; Colon polyps in his brother; Stroke in his brother. There is no history of Colon cancer, Esophageal cancer, Rectal cancer, Stomach cancer, Diabetes, or Colitis.   Allergies Allergies  Allergen Reactions  . Penicillins Hives  Did it involve swelling of the face/tongue/throat, SOB, or low BP? YES Did it involve sudden or severe rash/hives, skin peeling, or any reaction on the inside of your mouth or nose? NO Did you need to seek medical attention at a hospital or doctor's office? YES When did it last happen? 30 years ago If all above answers are "NO", may proceed with cephalosporin use.  . Simvastatin Other (See Comments)    insomnia     Home Medications  Prior to Admission medications   Medication Sig Start Date End Date Taking? Authorizing Provider  amLODipine (NORVASC) 10 MG tablet Take 10 mg by mouth daily.   Yes [provider]  atorvastatin (LIPITOR) 40 MG tablet Take 40 mg by mouth daily.   Yes [provider]  Cetirizine HCl 10 MG CAPS Take 1 capsule by mouth as needed (allergies).    Yes [provider]  hydrochlorothiazide (HYDRODIURIL) 25 MG tablet Take 25 mg by mouth daily.   Yes [provider]  losartan (COZAAR) 50 MG tablet Take 50 mg by mouth daily.   Yes  [provider]  omega-3 fish oil (MAXEPA) 1000 MG CAPS capsule Take 2 capsules by mouth daily.   Yes [provider]    Attending Note:  63 year old male with PMH of CAD presenting with MI with cardiogenic shock and renal failure.  Patient continues to require pressors.  No events overnight.  On exam, lungs with bibasilar crackles.  I reviewed CXR myself, pulmonary congestion improved.  Cortisol level 22.4, will add hydrocortisone for stress dose steroids.  Continue O2.  Titrate O2 for sat of 88-92%.  PCCM will continue to follow.  The patient is critically ill with multiple organ systems failure and requires high complexity decision making for assessment and support, frequent evaluation and titration of therapies, application of advanced monitoring technologies and extensive interpretation of multiple databases.   Critical Care Time devoted to patient care services described in this note is  31  Minutes. This time reflects time of care of this signee Dr Jennet Maduro. This critical care time does not reflect procedure time, or teaching time or supervisory time of PA/NP/Med student/Med Resident etc but could involve care discussion time.  Rush Farmer, M.D. [facility] Pulmonary/Critical Care Medicine. Pager: [phone]. After hours pager: [phone].

## 2018-08-03 ENCOUNTER — Inpatient Hospital Stay (HOSPITAL_COMMUNITY): Payer: Medicaid Other

## 2018-08-03 LAB — RENAL FUNCTION PANEL
Albumin: 3.1 g/dL — ABNORMAL LOW (ref 3.5–5.0)
Albumin: 3.1 g/dL — ABNORMAL LOW (ref 3.5–5.0)
Anion gap: 12 (ref 5–15)
Anion gap: 11 (ref 5–15)
BUN: 24 mg/dL — ABNORMAL HIGH (ref 8–23)
BUN: 25 mg/dL — AB (ref 8–23)
CALCIUM: 8.2 mg/dL — AB (ref 8.9–10.3)
CO2: 23 mmol/L (ref 22–32)
CO2: 24 mmol/L (ref 22–32)
Calcium: 8.2 mg/dL — ABNORMAL LOW (ref 8.9–10.3)
Chloride: 100 mmol/L (ref 98–111)
Chloride: 98 mmol/L (ref 98–111)
Creatinine, Ser: 2.74 mg/dL — ABNORMAL HIGH (ref 0.61–1.24)
Creatinine, Ser: 2.7 mg/dL — ABNORMAL HIGH (ref 0.61–1.24)
GFR calc Af Amer: 28 mL/min — ABNORMAL LOW (ref >60)
GFR calc Af Amer: 28 mL/min — ABNORMAL LOW (ref >60)
GFR calc non Af Amer: 24 mL/min — ABNORMAL LOW (ref >60)
GFR, EST NON AFRICAN AMERICAN: 24 mL/min — AB (ref >60)
Glucose, Bld: 141 mg/dL — ABNORMAL HIGH (ref 70–99)
Glucose, Bld: 131 mg/dL — ABNORMAL HIGH (ref 70–99)
Phosphorus: 4.1 mg/dL (ref 2.5–4.6)
Phosphorus: 4.1 mg/dL (ref 2.5–4.6)
Potassium: 4.8 mmol/L (ref 3.5–5.1)
Potassium: 5.4 mmol/L — ABNORMAL HIGH (ref 3.5–5.1)
SODIUM: 135 mmol/L (ref 135–145)
Sodium: 133 mmol/L — ABNORMAL LOW (ref 135–145)

## 2018-08-03 LAB — CBC
HCT: 31.5 % — ABNORMAL LOW (ref 39.0–52.0)
Hemoglobin: 9.3 g/dL — ABNORMAL LOW (ref 13.0–17.0)
MCH: 21.3 pg — ABNORMAL LOW (ref 26.0–34.0)
MCHC: 29.5 g/dL — ABNORMAL LOW (ref 30.0–36.0)
MCV: 72.1 fL — ABNORMAL LOW (ref 80.0–100.0)
Platelets: 168 10*3/uL (ref 150–400)
RBC: 4.37 MIL/uL (ref 4.22–5.81)
RDW: 25.4 % — ABNORMAL HIGH (ref 11.5–15.5)
WBC: 16.3 10*3/uL — ABNORMAL HIGH (ref 4.0–10.5)
nRBC: 48.4 % — ABNORMAL HIGH (ref 0.0–0.2)

## 2018-08-03 LAB — COOXEMETRY PANEL
Carboxyhemoglobin: 1.2 % (ref 0.5–1.5)
Carboxyhemoglobin: 1.2 % (ref 0.5–1.5)
Carboxyhemoglobin: 1 % (ref 0.5–1.5)
METHEMOGLOBIN: 1.3 % (ref 0.0–1.5)
METHEMOGLOBIN: 2 % — AB (ref 0.0–1.5)
Methemoglobin: 1.2 % (ref 0.0–1.5)
O2 Saturation: 32 %
O2 Saturation: 29 %
O2 Saturation: 32.8 %
TOTAL HEMOGLOBIN: 9.8 g/dL — AB (ref 12.0–16.0)
Total hemoglobin: 9.9 g/dL — ABNORMAL LOW (ref 12.0–16.0)
Total hemoglobin: 9.7 g/dL — ABNORMAL LOW (ref 12.0–16.0)

## 2018-08-03 LAB — HEPATIC FUNCTION PANEL
ALT: 416 U/L — ABNORMAL HIGH (ref 0–44)
AST: 158 U/L — ABNORMAL HIGH (ref 15–41)
Albumin: 3.3 g/dL — ABNORMAL LOW (ref 3.5–5.0)
Alkaline Phosphatase: 85 U/L (ref 38–126)
BILIRUBIN INDIRECT: 1.6 mg/dL — AB (ref 0.3–0.9)
Bilirubin, Direct: 0.8 mg/dL — ABNORMAL HIGH (ref 0.0–0.2)
Total Bilirubin: 2.4 mg/dL — ABNORMAL HIGH (ref 0.3–1.2)
Total Protein: 7 g/dL (ref 6.5–8.1)

## 2018-08-03 LAB — APTT: aPTT: 88 seconds — ABNORMAL HIGH (ref 24–36)

## 2018-08-03 LAB — HEPARIN LEVEL (UNFRACTIONATED): Heparin Unfractionated: 0.51 IU/mL (ref 0.30–0.70)

## 2018-08-03 LAB — MAGNESIUM: Magnesium: 3 mg/dL — ABNORMAL HIGH (ref 1.7–2.4)

## 2018-08-03 MED ORDER — MILRINONE LACTATE IN DEXTROSE 20-5 MG/100ML-% IV SOLN
0.2500 ug/kg/min | INTRAVENOUS | Status: DC
  Administered 2018-08-03 – 2018-08-10 (×11): 0.25 ug/kg/min via INTRAVENOUS
  Filled 2018-08-03 (×10): qty 100

## 2018-08-03 NOTE — Progress Notes (Signed)
Paged PA for pt's co-ox of 32. PA will pass it along, and have team come by to evaluate pt this morning. No new orders at this time. Will continue to monitor pt closely this shift.

## 2018-08-03 NOTE — Progress Notes (Signed)
[facility] ROUNDING NOTE   Subjective:   Acute systolic heart failure with cardiogenic shock ejection fraction 15% with severe RV dysfunction.  Currently receiving CRRT removing 100 cc/h.  Creatinine unclear baseline 3.91 on admission 07/04/2018   Sodium 132 potassium 4.2 chloride 97 CO2 24 BUN 23 creatinine 2.99 glucose 137 calcium 8.1 phosphorus 2.8 magnesium 2.6 AST 140 ALT 444  WBC 12.8 hemoglobin 9.0 platelets 185   Blood pressure 93/79 pulse 77 O2 sats 96% 2 L nasal cannula temperature 98.2  IV milrinone  IV amiodarone  IV lidocaine  IV norepinephrine  Fluid removal 3.6 L net -1 L weight 74.8.  Objective:  Vital signs in last 24 hours:  Temp:  [96.6 F (35.9 C)-97.5 F (36.4 C)] 97.5 F (36.4 C) (01/31 0500) Pulse Rate:  [44-122] 71 (01/31 0630) Resp:  [10-26] 17 (01/31 0630) BP: (82-110)/(61-79) 89/68 (01/31 0630) SpO2:  [90 %-100 %] 100 % (01/31 0630) Weight:  [74.8 kg] 74.8 kg (01/31 0419)  Weight change: 0 kg Filed Weights   07/15/2018 0500 08/02/18 0500 08/03/18 0419  Weight: 77.1 kg 74.8 kg 74.8 kg    Intake/Output: I/O last 3 completed shifts: In: 3831.2 [P.O.:1287; I.V.:2427.2; IV Piggyback:117] Out: 6789 [Other:6564]   Intake/Output this shift:  Total I/O In: 1085.9 [P.O.:360; I.V.:725.9] Out: 2221 [Urine:200; Other:2021]  CVS- RRR JVP to jaw 2+ carotid bruits no thyromegaly right IJ dialysis catheter RS- CTA no wheezes diminished breath sounds ABD-soft nontender nondistended bowel sounds present EXT- no edema   Basic Metabolic Panel: Recent Labs  Lab 07/30/18 0420  07/31/18 0500  07/12/2018 0241 07/17/2018 0243 07/10/2018 0908 07/31/2018 1600 08/02/18 0359 08/02/18 1716 08/03/18 0508  NA 131*   < > 132*   < >  --  132* 133*  135 128* 132* 129* 135  K 4.4   < > 4.2   < >  --  3.8 4.0  3.7 4.0 4.2 4.5 4.8  CL 96*   < > 96*   < >  --  97*  --  91* 97* 92* 100  CO2 22   < > 24   < >  --  24  --  23 24 21* 23  GLUCOSE 136*   < >  116*   < >  --  135*  --  145* 137* 147* 141*  BUN 68*   < > 34*   < >  --  24*  --  24* 23 23 24*  CREATININE 1.88*   < > 2.03*   < >  --  2.17*  --  2.61* 2.99* 2.98* 2.74*  CALCIUM 7.7*   < > 7.9*   < >  --  8.2*  --  8.1* 8.1* 8.4* 8.2*  MG 2.9*  --  2.8*  --  2.8*  --   --   --  2.6*  --  3.0*  PHOS 3.2   < > 2.7   < >  --  2.2*  --  2.6 2.8 4.2 4.1   < > = values in this interval not displayed.    Liver Function Tests: Recent Labs  Lab 07/23/2018 1651  07/29/18 0653  07/30/18 0735  07/31/18 0500  07/26/2018 0243 07/19/2018 1600 08/02/18 0359 08/02/18 1716 08/03/18 0508  AST 1,103*  --  756*  --  284*  --  187*  --   --   --  140*  --   --   ALT 1,058*  --  945*  --  767*  --  640*  --   --   --  444*  --   --   ALKPHOS 86  --  78  --  78  --  79  --   --   --  85  --   --   BILITOT 3.0*  --  3.4*  --  3.0*  --  2.7*  --   --   --  2.3*  --   --   PROT 7.2  --  6.4*  --  6.7  --  6.5  --   --   --  6.6  --   --   ALBUMIN 3.6   < > 3.4*   < > 3.1*   < > 3.1*   < > 3.1* 3.0* 3.1*  3.2* 3.2* 3.1*   < > = values in this interval not displayed.   No results for input(s): LIPASE, AMYLASE in the last 168 hours. Recent Labs  Lab 08/03/2018 1908  AMMONIA 21    CBC: Recent Labs  Lab 07/06/2018 1651  07/30/18 0420 07/31/18 0500 07/26/2018 0241 07/26/2018 0908 08/02/18 0359 08/03/18 0508  WBC  --    < > 12.0* 11.8* 12.6*  --  12.8* 16.3*  NEUTROABS 78.6*  --   --   --   --   --   --   --   HGB  --    < > 9.2* 9.0* 9.2* 10.9*  10.2* 9.0* 9.3*  HCT  --    < > 30.5* 29.0* 30.4* 32.0*  30.0* 30.2* 31.5*  MCV  --    < > 69.6* 70.0* 70.4*  --  71.2* 72.1*  PLT 194   < > 188 184 192  --  185 168   < > = values in this interval not displayed.    Cardiac Enzymes: Recent Labs  Lab 07/18/2018 1651 07/10/2018 2049 07/29/18 0150 07/29/18 0653  CKTOTAL 608*  --   --   --   TROPONINI  --  38.76* 28.85* 24.43*    BNP: Invalid input(s): POCBNP  CBG: Recent Labs  Lab 07/09/2018 1532   GLUCAP 148*    Microbiology: Results for orders placed or performed during the hospital encounter of 07/17/2018  Blood culture (routine x 2)     Status: None   Collection Time: 07/12/2018  4:50 PM  Result Value Ref Range Status   Specimen Description BLOOD RIGHT FOREARM  Final   Special Requests   Final    AEROBIC BOTTLE ONLY Blood Culture results may not be optimal due to an inadequate volume of blood received in culture bottles   Culture   Final    NO GROWTH 5 DAYS Performed at [facility], [place] [address], [place], [place] [address]    Report Status 08/02/2018 FINAL  Final  Blood culture (routine x 2)     Status: None   Collection Time: 07/27/2018  4:51 PM  Result Value Ref Range Status   Specimen Description BLOOD RIGHT ANTECUBITAL  Final   Special Requests   Final    BOTTLES DRAWN AEROBIC AND ANAEROBIC Blood Culture adequate volume   Culture   Final    NO GROWTH 5 DAYS Performed at [facility], [place] [address], [place], [place] [address]    Report Status 08/02/2018 FINAL  Final  MRSA PCR Screening     Status: None   Collection Time: 07/06/2018  6:26  PM  Result Value Ref Range Status   MRSA by PCR NEGATIVE NEGATIVE Final    Comment:        The GeneXpert MRSA Assay (FDA approved for NASAL specimens only), is one component of a comprehensive MRSA colonization surveillance program. It is not intended to diagnose MRSA infection nor to guide or monitor treatment for MRSA infections. Performed at [facility], [place] [address], [place], [place] [address]     Coagulation Studies: No results for input(s): LABPROT, INR in the last 72 hours.  Urinalysis: No results for input(s): COLORURINE, LABSPEC, PHURINE, GLUCOSEU, HGBUR, BILIRUBINUR, KETONESUR, PROTEINUR, UROBILINOGEN, NITRITE, LEUKOCYTESUR in the last 72 hours.  Invalid input(s): APPERANCEUR    Imaging: No results found.   Medications:   .  prismasol BGK 4/2.5 400 mL/hr at 08/03/18 0350  .   prismasol BGK 4/2.5 200 mL/hr at 08/02/18 1450  . sodium chloride    . sodium chloride    . amiodarone 60 mg/hr (08/03/18 0600)  . DOBUTamine 3 mcg/kg/min (08/03/18 0600)  . heparin 1,500 Units/hr (08/03/18 0600)  . norepinephrine (LEVOPHED) Adult infusion 19 mcg/min (08/03/18 0600)  . prismasol BGK 4/2.5 2,000 mL/hr at 08/03/18 8099   . aspirin EC  81 mg Oral Daily  . Chlorhexidine Gluconate Cloth  6 each Topical Daily  . darbepoetin (ARANESP) injection - NON-DIALYSIS  200 mcg Subcutaneous Q Thu-1800  . hydrocortisone sodium succinate  50 mg Intravenous Q6H  . mouth rinse  15 mL Mouth Rinse BID  . mexiletine  150 mg Oral Q8H  . pantoprazole  40 mg Oral BID  . sodium chloride flush  3 mL Intravenous Q12H   sodium chloride, Place/Maintain arterial line **AND** sodium chloride, acetaminophen, alteplase, heparin, nitroGLYCERIN, ondansetron (ZOFRAN) IV, sodium chloride, sodium chloride flush  Assessment/ Plan:   Acute systolic heart failure ejection fraction 15% severe RV dysfunction with cardiogenic shock.  CVP 14-15.  RHC 1/30 w/ high PCW 25. Continues to have fluid removed per cards request.   Coronary artery disease continues aspirin and statin will need cardiac cath prior to discharge  Acute kidney injury secondary to acute tubular necrosis and hypoperfusion admission creatinine 3.91 unclear baseline although thought to be normal several months ago.  Echogenic right kidney compatible medical renal disease.  Continues on CRRT.  Anemia we will start darbepoetin.  Completed RBC transfusion 07/29/2018  Shock liver continue hemodynamic support  V. tach continue amiodarone lidocaine initiated 07/18/2018.  Hypertension/volume.  Norepinephrine with volume removal through CRRT 100 cc/h.  Anticoagulation IV heparin  Access right IJ hemodialysis cath.   LOS: 6 Samuel Bates D Johnsie Moscoso @TODAY @6 :51 AM

## 2018-08-03 NOTE — Progress Notes (Signed)
ANTICOAGULATION CONSULT NOTE - [place] for heparin Indication: Chest pain/ACS  Allergies  Allergen Reactions  . Penicillins Hives    Did it involve swelling of the face/tongue/throat, SOB, or low BP? YES Did it involve sudden or severe rash/hives, skin peeling, or any reaction on the inside of your mouth or nose? NO Did you need to seek medical attention at a hospital or doctor's office? YES When did it last happen? 30 years ago If all above answers are "NO", may proceed with cephalosporin use.  . Simvastatin Other (See Comments)    insomnia    Patient Measurements: Height: 6\' 3"  (190.5 cm) Weight: 164 lb 14.5 oz (74.8 kg) IBW/kg (Calculated) : 84.5 Heparin Dosing Weight: 89.8 kg  Vital Signs: Temp: 97.5 F (36.4 C) (01/31 0500) Temp Source: Axillary (01/31 0500) BP: 89/68 (01/31 0630) Pulse Rate: 71 (01/31 0630)  Labs: Recent Labs    07/21/2018 0241  08/03/2018 0908  08/02/18 0215 08/02/18 0359 08/02/18 1209 08/02/18 1716 08/03/18 0508  HGB 9.2*  --  10.9*  10.2*  --   --  9.0*  --   --  9.3*  HCT 30.4*  --  32.0*  30.0*  --   --  30.2*  --   --  31.5*  PLT 192  --   --   --   --  185  --   --  168  APTT 94*  --   --   --   --  73*  --   --  88*  HEPARINUNFRC  --    < >  --   --  0.31  --  0.34  --  0.51  CREATININE  --    < >  --    < >  --  2.99*  --  2.98* 2.74*   < > = values in this interval not displayed.    Estimated Creatinine Clearance: 29.6 mL/min (A) (by C-G formula based on SCr of 2.74 mg/dL (H)).   Medical History: Past Medical History:  Diagnosis Date  . Allergy   . Hx of adenomatous colonic polyps 03/07/2017  . Hyperlipidemia   . Hypertension    Assessment: 63 yo male admitted with chest pain and r/o NSTEMI/ACS. MD wishes to start heparin drip. No anticoagulation noted PTA.    Heparin level therapeutic this morning, Hgb low but overall stable.   Goal of Therapy:  Heparin level 0.3-0.7 units/ml Monitor  platelets by anticoagulation protocol: Yes   Plan:  -Continue heparin 1500 units/hr -Daily heparin level and CBC   Arrie Senate, PharmD, BCPS Clinical Pharmacist [phone] Please check AMION for all [facility] numbers 08/03/2018

## 2018-08-03 NOTE — Progress Notes (Addendum)
Advanced Heart Failure Rounding Note  PCP-Cardiologist: No primary care provider on file.   Subjective:    1/26 Had VT twice ~1915 for 1-2 minutes. Started on amio. Re-bolused due to 8 minute run ~2300   Started on lidocaine 07/10/2018 with very frequent NSVT/Ectopy with. Transitioned to Mexitil 08/02/2018  Milrinone switched to Dobutamine 3 mcg/kg/min 08/02/2018. Coox 32% this am.   Had ~ 200 cc UOP yesterday. CRRT pulling ~100 cc/hr. CVP remains 12-13   Sitting up in chair. Excited about making a small amount of urine yesterday. Denies CP or SOB. Awake and alert.   Echo 07/30/2018 LVEF 15%, with severe RV failure.   RHC 08/02/2018 On milrinone 0.125 and NE 14  RA = 13 RV = 51/18 PA = 50/24 (34) PCW = 25 (v = 35) Fick cardiac output/index = 5.42/2.59 Thermo CO/CI = 5.49/2.63 PVR = 1.6 WU FA sat = 97% PA sat = 54%, 58%  Objective:   Weight Range: 74.8 kg Body mass index is 20.61 kg/m.   Vital Signs:   Temp:  [96.6 F (35.9 C)-97.5 F (36.4 C)] 97.5 F (36.4 C) (01/31 0500) Pulse Rate:  [44-122] 71 (01/31 0630) Resp:  [10-26] 17 (01/31 0630) BP: (82-110)/(61-79) 89/68 (01/31 0630) SpO2:  [90 %-100 %] 100 % (01/31 0630) Weight:  [74.8 kg] 74.8 kg (01/31 0419) Last BM Date: 08/02/18  Weight change: Filed Weights   07/07/2018 0500 08/02/18 0500 08/03/18 0419  Weight: 77.1 kg 74.8 kg 74.8 kg   Intake/Output:   Intake/Output Summary (Last 24 hours) at 08/03/2018 0753 Last data filed at 08/03/2018 0700 Gross per 24 hour  Intake 2391.55 ml  Output 5004 ml  Net -2612.45 ml    Physical Exam    General: NAD HEENT: Normal Neck: Supple. JVP to jaw.  Carotids 2+ bilat; no bruits. No thyromegaly or nodule noted. Cor: PMI nondisplaced. RRR, +S3. RIJ TLC cath site stable.  Lungs: Diminished basilar sounds.  Abdomen: Soft, non-tender, non-distended, no HSM. No bruits or masses. +BS  Extremities: No cyanosis, clubbing, or rash.  Neuro: Alert & orientedx3, cranial  nerves grossly intact. moves all 4 extremities w/o difficulty. Affect pleasant   Telemetry   NSR 70s, no further VT, personally reviewed.   EKG    No new tracings.    Labs    CBC Recent Labs    08/02/18 0359 08/03/18 0508  WBC 12.8* 16.3*  HGB 9.0* 9.3*  HCT 30.2* 31.5*  MCV 71.2* 72.1*  PLT 185 045   Basic Metabolic Panel Recent Labs    08/02/18 0359 08/02/18 1716 08/03/18 0508  NA 132* 129* 135  K 4.2 4.5 4.8  CL 97* 92* 100  CO2 24 21* 23  GLUCOSE 137* 147* 141*  BUN 23 23 24*  CREATININE 2.99* 2.98* 2.74*  CALCIUM 8.1* 8.4* 8.2*  MG 2.6*  --  3.0*  PHOS 2.8 4.2 4.1   Liver Function Tests Recent Labs    08/02/18 0359 08/02/18 1716 08/03/18 0508  AST 140*  --   --   ALT 444*  --   --   ALKPHOS 85  --   --   BILITOT 2.3*  --   --   PROT 6.6  --   --   ALBUMIN 3.1*  3.2* 3.2* 3.1*   No results for input(s): LIPASE, AMYLASE in the last 72 hours. Cardiac Enzymes No results for input(s): CKTOTAL, CKMB, CKMBINDEX, TROPONINI in the last 72 hours.  BNP: BNP (last 3 results)  Recent Labs    07/12/2018 1651  BNP 4,188.4*    ProBNP (last 3 results) No results for input(s): PROBNP in the last 8760 hours.   D-Dimer No results for input(s): DDIMER in the last 72 hours. Hemoglobin A1C No results for input(s): HGBA1C in the last 72 hours. Fasting Lipid Panel No results for input(s): CHOL, HDL, LDLCALC, TRIG, CHOLHDL, LDLDIRECT in the last 72 hours. Thyroid Function Tests No results for input(s): TSH, T4TOTAL, T3FREE, THYROIDAB in the last 72 hours.  Invalid input(s): FREET3  Other results:   Imaging    No results found.   Medications:     Scheduled Medications: . aspirin EC  81 mg Oral Daily  . Chlorhexidine Gluconate Cloth  6 each Topical Daily  . darbepoetin (ARANESP) injection - NON-DIALYSIS  200 mcg Subcutaneous Q Thu-1800  . hydrocortisone sodium succinate  50 mg Intravenous Q6H  . mouth rinse  15 mL Mouth Rinse BID  . mexiletine   150 mg Oral Q8H  . pantoprazole  40 mg Oral BID  . sodium chloride flush  3 mL Intravenous Q12H    Infusions: .  prismasol BGK 4/2.5 400 mL/hr at 08/03/18 0350  .  prismasol BGK 4/2.5 200 mL/hr at 08/02/18 1450  . sodium chloride    . sodium chloride    . amiodarone 60 mg/hr (08/03/18 0700)  . DOBUTamine 3 mcg/kg/min (08/03/18 0700)  . heparin 1,500 Units/hr (08/03/18 0700)  . norepinephrine (LEVOPHED) Adult infusion 22 mcg/min (08/03/18 0700)  . prismasol BGK 4/2.5 2,000 mL/hr at 08/03/18 6283    PRN Medications: sodium chloride, Place/Maintain arterial line **AND** sodium chloride, acetaminophen, alteplase, heparin, nitroGLYCERIN, ondansetron (ZOFRAN) IV, sodium chloride, sodium chloride flush   Patient Profile   62 y.o.malewith past medical history of hypertension, hyperlipidemia admitted with recent but late presenting inferior lateral myocardial infarction, cardiogenic shock, acute renal failure and microcytic anemia. Echocardiogram showed severe LV and RV dysfunction and moderate mitral regurgitation.  Assessment/Plan   1. Acute systolic HF with cardiogenic shock due to OOH MI - Echo 1/5 EF 15% severe RV dysfunction and moderate MR - Initial co-ox 28% but this was drawn femorally so inaccurate.  - Coox 32% on dobutamine 3 mcg/kg/min and NE 22. Repeat pending.  - CVP ~12. CRRT pulling -100 cc/hr currently.  MAPs running low.  - RHC as above with low cardiac output and continued volume overload.  - Continue volume removal with CRRT.  - No ACE/ARB/ARNI or b-blocker with shock and AKI  2. CAD - s/p OOH in inferolateral MI - Initial trop 28, ten trended down.   - Having some CP but mostly related to VT - Continue ASA and statin. No b-blocker with shock - He will need cath prior to discharge but would hold until we see if kidneys recover, or if transitions to HD.   3. AKI/ESRD with uremia - likely due to ATN and hypoperfusion. Initial BUN/CR 198/3.91 - Baseline  creatinine normal several months ago - Renal following CRT started on 1/25 via RFV cath. Goal remains -100/cc hr for now.  - Renal u/s 1/26: Mildly echogenic right kidney, compatible with chronic medical renal disease. - Remains on heparin. Watch closely for bleeding. Dosing per PharmD.   4. Microcytic anemia due to IDA - Hgb 9.3 today. MCV 71.2. - Smear without schistocytes - Has received 2u RBCS - Iron stores low. Given Feraheme - Will eventually need GI w/u - Continue protonix  5. Shock liver - Remain somewhat elevated. Follow with  hemodynamic support.   6. VT - Continue on amiodarone gtt. Keep K > 4.0 Mg > 2.0 - Started on lidocaine 07/24/2018 with great suppression. Now on Mexitil.  - Continue dobutamine 3 mcg/kg/min.  - Lifevest has been ordered. Follow prn.   Length of Stay: [address]  Annamaria Helling  08/03/2018, 7:53 AM  Advanced Heart Failure Team Pager [phone] (M-F; 7a - 4p)  Please contact [facility] for night-coverage after hours (4p -7a ) and weekends on [email or web address]  Agree with above.   Increasingly difficult situation. Remains on NE and milrinone but co-ox dropping. He remains on CRT but is beginning to make some urine. CVP 12  Lying in bed NAD RIJ CRRT catheter Cor RRR +s3 Lungs clear Ab soft NT Ext warm  Clinically looking ok. But remains on dual pressors and with falling co-ox c/x shock physiology. Portends poor prognosis. Will continue CRT and inotropes and follow clinically. Titrate to keep SBP > 100. Rhythm stable. Hopefully Kidneys start to turn around.   CRITICAL CARE Performed by: Glori Bickers  Total critical care time: 35 minutes  Critical care time was exclusive of separately billable procedures and treating other patients.  Critical care was necessary to treat or prevent imminent or life-threatening deterioration.  Critical care was time spent personally by me (independent of midlevel providers or residents) on the  following activities: development of treatment plan with patient and/or surrogate as well as nursing, discussions with consultants, evaluation of patient's response to treatment, examination of patient, obtaining history from patient or surrogate, ordering and performing treatments and interventions, ordering and review of laboratory studies, ordering and review of radiographic studies, pulse oximetry and re-evaluation of patient's condition.  Glori Bickers, MD  8:46 PM

## 2018-08-03 NOTE — Progress Notes (Addendum)
  Repeat Coox this afternoon 32.8% back on milrinone 0.25 mcg/kg/min and levophed 22 mcg.  No immediate change as patient has similiarly failed Dobutamine. Will discuss with Dr. Haroldine Laws..  Pt asymptomatic at this time. Systolic BPs in 40-76K on my check. He has not made any urine thus far today.    Legrand Como [address] [place], PA-C 08/03/2018 4:28 PM

## 2018-08-03 NOTE — Progress Notes (Addendum)
NAME:  Samuel Bates, MRN:  [id number], DOB:  1956/06/13, LOS: 5 ADMISSION DATE:  07/13/2018, CONSULTATION DATE:1/25 REFERRING JM:EQAS Schlossman, CHIEF COMPLAINT:Shortness of breath x 5 days  Brief History   63 year old man presented 1/25 with shortness of breath found to have severe diastolic heart failure as a result of a remote myocardial infarction evident on EKG with elevated troponin. The MI has been managed medically, course has been complicated by anuric renal failure and ventricular tachycardia.   Past Medical History  HTN  HLD  Colon polyps  Current daily cigar smoker   Significant Hospital Events   1/25 admission, started on heparin gtt and CRRT  1/29 went into sustained ventricular tachycardia, started on lidocaine infusion   Consults:  Heart Failure  Nephrology   Procedures:  1/25 R femoral HD catheter 1/26 Radial artery catheter > 1/29  1/26 IJ CVC 1/29 L IJ hemodialysis catheter    Significant Diagnostic Tests:  1/25 echocardiogram EF 10-15%, mild concentric hypertrophy, restrictive physiology, akinesis of the apical myocardium, moderated mitral regurgitation, moderately dilated left atrium, severely dilated right atrium, moderate tricuspid regurgitation, pulmonary artery pressure 45 mm Hg   1/25 renal US - echogenic right kidney consistent with medical renal disease, left kidney not visualized due to positioning   1/29 right heart cath PCW = 25 (v = 35)  Micro Data:  1/25 - blood cultures -ngtd MRSA neg   Antimicrobials:  Vanc 1/26>>1/27 Cefepime 1/25>>1/27   Interim history/subjective:  Hydrocortisone started yesterday  Co-ox 32 this morning   Objective   Blood pressure (!) 83/68, pulse 71, temperature (!) 97.4 F (36.3 C), temperature source Oral, resp. rate 18, height 6\' 3"  (1.905 m), weight 74.8 kg, SpO2 94 %. CVP:  [8 mmHg-24 mmHg] 12 mmHg      Intake/Output Summary (Last 24 hours) at 08/03/2018 0838 Last data filed  at 08/03/2018 0800 Gross per 24 hour  Intake 2202.18 ml  Output 4889 ml  Net -2686.82 ml   Filed Weights   07/13/2018 0500 08/02/18 0500 08/03/18 0419  Weight: 77.1 kg 74.8 kg 74.8 kg    Examination: General: ill appearing HENT: [place]  Lungs: course breath sounds throughout, no wheezing  Cardiovascular: regular rate and rhythm, no murmur  Abdomen: soft, non tender, non distended  Extremities: no peripheral edema, normothermic  Neuro: awake, alert, oriented   Resolved Hospital Problem list     Assessment & Plan:   Cardiogenic shock 2/2 ischemic cardiomyopathy and severely reduced systolic function  Shock liver  - levophed  - dobutamine  - hydrocortisone  - appreciate heart failure recommendations   Subacute inferior lateral myocardial infarction  - heparin, aspirin  - holding off on statin, BB, ACE/ARB for shock and renal failure  - obtain hepatic function today  - plan is for catheterization once renal function has stabilized   Hypoxic respiratory failure secondary to pulmonary edema  - CRRT  - Wean supplemental oxygen   Acute kidney injury secondary to acute tubular necrosis and hypoperfusion  Hypervolemic hyponatremia - resolved  - CRT  - appreciate nephrology recommendations   Vtach  - amiodarone   Microcytic anemia 2/2 GI bleed prior to admission  Currently on heparin gtt  Post one unit PRBC and feraheme earlier in admission, hemoglobin remains stable.  - transfuse for HGB <8 given ACS  - protonix BID   Best practice:  Diet: renal/ carb mod  Pain/Anxiety/Delirium protocol (if indicated): na  VAP protocol (if indicated): na  DVT prophylaxis: heparin  gtt  GI prophylaxis: BID protonix  Glucose control: CBG monitoring  Mobility: bedrest while on CRRT  Code Status: full  Family Communication: sister updated at bedside yesterday, will attempt to update again today  Disposition: Continue ICU monitoring, still requiring CRRT, ionotropes, and pressors     Labs   CBC: Recent Labs  Lab 07/13/2018 1651  07/30/18 0420 07/31/18 0500 07/22/2018 0241 07/21/2018 0908 08/02/18 0359 08/03/18 0508  WBC  --    < > 12.0* 11.8* 12.6*  --  12.8* 16.3*  NEUTROABS 78.6*  --   --   --   --   --   --   --   HGB  --    < > 9.2* 9.0* 9.2* 10.9*  10.2* 9.0* 9.3*  HCT  --    < > 30.5* 29.0* 30.4* 32.0*  30.0* 30.2* 31.5*  MCV  --    < > 69.6* 70.0* 70.4*  --  71.2* 72.1*  PLT 194   < > 188 184 192  --  185 168   < > = values in this interval not displayed.    Basic Metabolic Panel: Recent Labs  Lab 07/30/18 0420  07/31/18 0500  07/21/2018 0241 07/16/2018 0243 07/23/2018 0908 07/05/2018 1600 08/02/18 0359 08/02/18 1716 08/03/18 0508  NA 131*   < > 132*   < >  --  132* 133*  135 128* 132* 129* 135  K 4.4   < > 4.2   < >  --  3.8 4.0  3.7 4.0 4.2 4.5 4.8  CL 96*   < > 96*   < >  --  97*  --  91* 97* 92* 100  CO2 22   < > 24   < >  --  24  --  23 24 21* 23  GLUCOSE 136*   < > 116*   < >  --  135*  --  145* 137* 147* 141*  BUN 68*   < > 34*   < >  --  24*  --  24* 23 23 24*  CREATININE 1.88*   < > 2.03*   < >  --  2.17*  --  2.61* 2.99* 2.98* 2.74*  CALCIUM 7.7*   < > 7.9*   < >  --  8.2*  --  8.1* 8.1* 8.4* 8.2*  MG 2.9*  --  2.8*  --  2.8*  --   --   --  2.6*  --  3.0*  PHOS 3.2   < > 2.7   < >  --  2.2*  --  2.6 2.8 4.2 4.1   < > = values in this interval not displayed.   GFR: Estimated Creatinine Clearance: 29.6 mL/min (A) (by C-G formula based on SCr of 2.74 mg/dL (H)). Recent Labs  Lab 07/14/2018 1651 07/27/2018 1908  07/29/18 0416  07/31/18 0500 07/14/2018 0241 08/02/18 0359 08/03/18 0508  WBC  --   --    < >  --    < > 11.8* 12.6* 12.8* 16.3*  LATICACIDVEN 2.2* 2.3*  --  2.1*  --   --   --   --   --    < > = values in this interval not displayed.    Liver Function Tests: Recent Labs  Lab 07/21/2018 1651  07/29/18 9767  07/30/18 0735  07/31/18 0500  07/31/2018 0243 07/09/2018 1600 08/02/18 0359 08/02/18 1716 08/03/18 0508  AST  1,103*  --  756*  --  284*  --  187*  --   --   --  140*  --   --   ALT 1,058*  --  945*  --  767*  --  640*  --   --   --  444*  --   --   ALKPHOS 86  --  78  --  78  --  79  --   --   --  85  --   --   BILITOT 3.0*  --  3.4*  --  3.0*  --  2.7*  --   --   --  2.3*  --   --   PROT 7.2  --  6.4*  --  6.7  --  6.5  --   --   --  6.6  --   --   ALBUMIN 3.6   < > 3.4*   < > 3.1*   < > 3.1*   < > 3.1* 3.0* 3.1*  3.2* 3.2* 3.1*   < > = values in this interval not displayed.   No results for input(s): LIPASE, AMYLASE in the last 168 hours. Recent Labs  Lab 07/24/2018 1908  AMMONIA 21    ABG    Component Value Date/Time   HCO3 24.0 07/21/2018 0908   HCO3 25.7 07/17/2018 0908   TCO2 25 07/16/2018 0908   TCO2 27 07/23/2018 0908   ACIDBASEDEF 1.0 07/07/2018 0908   O2SAT 29.0 08/03/2018 0815     Coagulation Profile: Recent Labs  Lab 07/27/2018 1651  INR 1.81  1.83    Cardiac Enzymes: Recent Labs  Lab 07/31/2018 1651 07/09/2018 2049 07/29/18 0150 07/29/18 0653  CKTOTAL 608*  --   --   --   TROPONINI  --  38.76* 28.85* 24.43*    HbA1C: No results found for: HGBA1C  CBG: Recent Labs  Lab 08/02/2018 1532  GLUCAP 148*    Review of Systems:   Denies chest pain, difficulty breathing. Does endorse thirst and reduced appetite.   Past Medical History  He,  has a past medical history of Allergy, adenomatous colonic polyps (03/07/2017), Hyperlipidemia, and Hypertension.   Surgical History    Past Surgical History:  Procedure Laterality Date  . COLONOSCOPY    . DIALYSIS/PERMA CATHETER INSERTION N/A 07/26/2018   Procedure: DIALYSIS/PERMA CATHETER INSERTION;  Surgeon: Jolaine Artist, MD;  Location: [facility];  Service: Cardiovascular;  Laterality: N/A;  . POLYPECTOMY    . RIGHT HEART CATH N/A 07/31/2018   Procedure: RIGHT HEART CATH;  Surgeon: Jolaine Artist, MD;  Location: [facility];  Service: Cardiovascular;  Laterality: N/A;  . TONSILLECTOMY     age 68      Social History   reports that he has been smoking cigars. He has never used smokeless tobacco. He reports current alcohol use of about 2.0 standard drinks of alcohol per week. He reports current drug use. Drug: Marijuana.   Family History   His family history includes COPD in his father and sister; Colon polyps in his brother; Stroke in his brother. There is no history of Colon cancer, Esophageal cancer, Rectal cancer, Stomach cancer, Diabetes, or Colitis.   Allergies Allergies  Allergen Reactions  . Penicillins Hives    Did it involve swelling of the face/tongue/throat, SOB, or low BP? YES Did it involve sudden or severe rash/hives, skin peeling, or any reaction on the inside of your mouth or nose? NO Did you need to seek medical  attention at a hospital or doctor's office? YES When did it last happen? 30 years ago If all above answers are "NO", may proceed with cephalosporin use.  . Simvastatin Other (See Comments)    insomnia     Home Medications  Prior to Admission medications   Medication Sig Start Date End Date Taking? Authorizing Provider  amLODipine (NORVASC) 10 MG tablet Take 10 mg by mouth daily.   Yes [provider]  atorvastatin (LIPITOR) 40 MG tablet Take 40 mg by mouth daily.   Yes [provider]  Cetirizine HCl 10 MG CAPS Take 1 capsule by mouth as needed (allergies).    Yes [provider]  hydrochlorothiazide (HYDRODIURIL) 25 MG tablet Take 25 mg by mouth daily.   Yes [provider]  losartan (COZAAR) 50 MG tablet Take 50 mg by mouth daily.   Yes [provider]  omega-3 fish oil (MAXEPA) 1000 MG CAPS capsule Take 2 capsules by mouth daily.   Yes [provider]     Critical care time:     Attending Note:  63 year old male with PMH of cardiac disease who presents with cardiogenic shock and renal failure.  No events overnight.  Required higher doses of pressors overnight.  On exam, bibasilar  crackles noted.  I reviewed CXR myself, no acute disease noted.  Discussed with PCCM-NP.  Will continue titration of pressors (levophed, dobutamine and stress dose steroids).  Continue CRRT.  Continue support for now.  PCCM will sign off, please call back if needed.  The patient is critically ill with multiple organ systems failure and requires high complexity decision making for assessment and support, frequent evaluation and titration of therapies, application of advanced monitoring technologies and extensive interpretation of multiple databases.   Critical Care Time devoted to patient care services described in this note is  31  Minutes. This time reflects time of care of this signee Dr Jennet Maduro. This critical care time does not reflect procedure time, or teaching time or supervisory time of PA/NP/Med student/Med Resident etc but could involve care discussion time.  Rush Farmer, M.D. [facility] Pulmonary/Critical Care Medicine. Pager: [phone]. After hours pager: [phone].

## 2018-08-04 DIAGNOSIS — R570 Cardiogenic shock: Secondary | ICD-10-CM

## 2018-08-04 DIAGNOSIS — M109 Gout, unspecified: Secondary | ICD-10-CM

## 2018-08-04 DIAGNOSIS — I472 Ventricular tachycardia, unspecified: Secondary | ICD-10-CM

## 2018-08-04 LAB — HEPATIC FUNCTION PANEL
ALT: 341 U/L — ABNORMAL HIGH (ref 0–44)
AST: 129 U/L — ABNORMAL HIGH (ref 15–41)
Albumin: 3.2 g/dL — ABNORMAL LOW (ref 3.5–5.0)
Alkaline Phosphatase: 79 U/L (ref 38–126)
Bilirubin, Direct: 1.1 mg/dL — ABNORMAL HIGH (ref 0.0–0.2)
Indirect Bilirubin: 1.5 mg/dL — ABNORMAL HIGH (ref 0.3–0.9)
Total Bilirubin: 2.6 mg/dL — ABNORMAL HIGH (ref 0.3–1.2)
Total Protein: 6.8 g/dL (ref 6.5–8.1)

## 2018-08-04 LAB — RENAL FUNCTION PANEL
ALBUMIN: 3.2 g/dL — AB (ref 3.5–5.0)
Albumin: 2.9 g/dL — ABNORMAL LOW (ref 3.5–5.0)
Anion gap: 14 (ref 5–15)
Anion gap: 12 (ref 5–15)
BUN: 26 mg/dL — ABNORMAL HIGH (ref 8–23)
BUN: 26 mg/dL — ABNORMAL HIGH (ref 8–23)
CO2: 22 mmol/L (ref 22–32)
CO2: 23 mmol/L (ref 22–32)
CREATININE: 2.87 mg/dL — AB (ref 0.61–1.24)
Calcium: 8.5 mg/dL — ABNORMAL LOW (ref 8.9–10.3)
Calcium: 7.5 mg/dL — ABNORMAL LOW (ref 8.9–10.3)
Chloride: 96 mmol/L — ABNORMAL LOW (ref 98–111)
Chloride: 95 mmol/L — ABNORMAL LOW (ref 98–111)
Creatinine, Ser: 2.73 mg/dL — ABNORMAL HIGH (ref 0.61–1.24)
GFR calc Af Amer: 28 mL/min — ABNORMAL LOW (ref >60)
GFR calc non Af Amer: 24 mL/min — ABNORMAL LOW (ref >60)
GFR calc non Af Amer: 22 mL/min — ABNORMAL LOW (ref >60)
GFR, EST AFRICAN AMERICAN: 26 mL/min — AB (ref >60)
GLUCOSE: 129 mg/dL — AB (ref 70–99)
Glucose, Bld: 130 mg/dL — ABNORMAL HIGH (ref 70–99)
Phosphorus: 3.6 mg/dL (ref 2.5–4.6)
Phosphorus: 3.5 mg/dL (ref 2.5–4.6)
Potassium: 5.2 mmol/L — ABNORMAL HIGH (ref 3.5–5.1)
Potassium: 4.8 mmol/L (ref 3.5–5.1)
SODIUM: 132 mmol/L — AB (ref 135–145)
Sodium: 130 mmol/L — ABNORMAL LOW (ref 135–145)

## 2018-08-04 LAB — CBC
HCT: 31.2 % — ABNORMAL LOW (ref 39.0–52.0)
HEMOGLOBIN: 9.2 g/dL — AB (ref 13.0–17.0)
MCH: 21.5 pg — ABNORMAL LOW (ref 26.0–34.0)
MCHC: 29.5 g/dL — AB (ref 30.0–36.0)
MCV: 72.9 fL — ABNORMAL LOW (ref 80.0–100.0)
Platelets: 164 10*3/uL (ref 150–400)
RBC: 4.28 MIL/uL (ref 4.22–5.81)
RDW: 26 % — ABNORMAL HIGH (ref 11.5–15.5)
WBC: 17.5 10*3/uL — ABNORMAL HIGH (ref 4.0–10.5)
nRBC: 36.3 % — ABNORMAL HIGH (ref 0.0–0.2)

## 2018-08-04 LAB — COOXEMETRY PANEL
Carboxyhemoglobin: 1.7 % — ABNORMAL HIGH (ref 0.5–1.5)
Methemoglobin: 1.3 % (ref 0.0–1.5)
O2 SAT: 50.4 %
TOTAL HEMOGLOBIN: 9.5 g/dL — AB (ref 12.0–16.0)

## 2018-08-04 LAB — APTT: aPTT: 84 seconds — ABNORMAL HIGH (ref 24–36)

## 2018-08-04 LAB — HEPARIN LEVEL (UNFRACTIONATED): Heparin Unfractionated: 0.37 IU/mL (ref 0.30–0.70)

## 2018-08-04 LAB — MAGNESIUM: Magnesium: 2.7 mg/dL — ABNORMAL HIGH (ref 1.7–2.4)

## 2018-08-04 MED ORDER — COLCHICINE 0.6 MG PO TABS
0.6000 mg | ORAL_TABLET | Freq: Two times a day (BID) | ORAL | Status: AC
  Administered 2018-08-04 – 2018-08-05 (×4): 0.6 mg via ORAL
  Filled 2018-08-04 (×4): qty 1

## 2018-08-04 MED ORDER — ALTEPLASE 2 MG IJ SOLR
2.0000 mg | Freq: Once | INTRAMUSCULAR | Status: AC
  Administered 2018-08-04: 2 mg

## 2018-08-04 MED ORDER — ATORVASTATIN CALCIUM 80 MG PO TABS
80.0000 mg | ORAL_TABLET | Freq: Every day | ORAL | Status: DC
  Administered 2018-08-04 – 2018-08-17 (×14): 80 mg via ORAL
  Filled 2018-08-04 (×14): qty 1

## 2018-08-04 NOTE — Progress Notes (Signed)
ANTICOAGULATION CONSULT NOTE - [facility] for heparin Indication: Chest pain/ACS  Allergies  Allergen Reactions  . Penicillins Hives    Did it involve swelling of the face/tongue/throat, SOB, or low BP? YES Did it involve sudden or severe rash/hives, skin peeling, or any reaction on the inside of your mouth or nose? NO Did you need to seek medical attention at a hospital or doctor's office? YES When did it last happen? 30 years ago If all above answers are "NO", may proceed with cephalosporin use.  . Simvastatin Other (See Comments)    insomnia    Patient Measurements: Height: 6\' 3"  (190.5 cm) Weight: 164 lb 3.9 oz (74.5 kg) IBW/kg (Calculated) : 84.5 Heparin Dosing Weight: 89.8 kg  Vital Signs: Temp: 98 F (36.7 C) (02/01 0330) Temp Source: Oral (02/01 0330) BP: 92/63 (02/01 1000) Pulse Rate: 72 (02/01 1000)  Labs: Recent Labs    08/02/18 0359 08/02/18 1209  08/03/18 0508 08/03/18 1606 08/04/18 0408  HGB 9.0*  --   --  9.3*  --  9.2*  HCT 30.2*  --   --  31.5*  --  31.2*  PLT 185  --   --  168  --  164  APTT 73*  --   --  88*  --  84*  HEPARINUNFRC  --  0.34  --  0.51  --  0.37  CREATININE 2.99*  --    < > 2.74* 2.70* 2.73*   < > = values in this interval not displayed.    Estimated Creatinine Clearance: 29.6 mL/min (A) (by C-G formula based on SCr of 2.73 mg/dL (H)).   Medical History: Past Medical History:  Diagnosis Date  . Allergy   . Hx of adenomatous colonic polyps 03/07/2017  . Hyperlipidemia   . Hypertension    Assessment: 63 yo male admitted with chest pain and r/o NSTEMI/ACS. MD wishes to start heparin drip. No anticoagulation noted PTA.    Heparin level 0.37 therapeutic this morning on heparin drip 1500 uts/hr, Hgb low but overall stable.   Goal of Therapy:  Heparin level 0.3-0.7 units/ml Monitor platelets by anticoagulation protocol: Yes   Plan:  -Continue heparin 1500 units/hr -Daily heparin level and  CBC  Bonnita Nasuti Pharm.D. CPP, BCPS Clinical Pharmacist [phone] 08/04/2018 10:46 AM

## 2018-08-04 NOTE — Progress Notes (Signed)
Advanced Heart Failure Rounding Note  PCP-Cardiologist: No primary care provider on file.   Subjective:    Remains on NE 22 and milrinone 0.25. Co-ox improved but still low at 50%.   Remains anuric. On CRRT at - 100. CVP 7. CRT filter clotted so has been up walking around.   On IV amio and mexilitene. VT quiescent.   Denies SOB, CP orthopnea or PND. + gout in left wrist  WBC climbing 12 -> 16 -> 17 on steroids  Echo 07/26/2018 LVEF 15%, with severe RV failure.   RHC 08/02/2018 On milrinone 0.125 and NE 14  RA = 13 RV = 51/18 PA = 50/24 (34) PCW = 25 (v = 35) Fick cardiac output/index = 5.42/2.59 Thermo CO/CI = 5.49/2.63 PVR = 1.6 WU FA sat = 97% PA sat = 54%, 58%  Objective:   Weight Range: 74.5 kg Body mass index is 20.53 kg/m.   Vital Signs:   Temp:  [96.7 F (35.9 C)-98 F (36.7 C)] 98 F (36.7 C) (02/01 0330) Pulse Rate:  [25-77] 69 (02/01 0835) Resp:  [10-24] 18 (02/01 0835) BP: (72-110)/(58-86) 90/69 (02/01 0835) SpO2:  [87 %-100 %] 96 % (02/01 0835) Weight:  [74.5 kg] 74.5 kg (02/01 0600) Last BM Date: 08/03/18  Weight change: Filed Weights   08/02/18 0500 08/03/18 0419 08/04/18 0600  Weight: 74.8 kg 74.8 kg 74.5 kg   Intake/Output:   Intake/Output Summary (Last 24 hours) at 08/04/2018 0845 Last data filed at 08/04/2018 0800 Gross per 24 hour  Intake 2070.85 ml  Output 3948 ml  Net -1877.15 ml    Physical Exam    Lying in bed General:  No resp difficulty HEENT: normal Neck: supple. RIJ trialysis  LIJ TLC Carotids 2+ bilat; no bruits. No lymphadenopathy or thryomegaly appreciated. Cor: PMI nondisplaced. Regular rate & rhythm. No rubs, gallops or murmurs. Lungs: clear Abdomen: soft, nontender, nondistended. No hepatosplenomegaly. No bruits or masses. Good bowel sounds. Extremities: no cyanosis, clubbing, rash, edema  R wrist tender to palpation Neuro: alert & orientedx3, cranial nerves grossly intact. moves all 4 extremities w/o  difficulty. Affect pleasant   Telemetry   NSR 70-80, no further VT, personally reviewed.   EKG    No new tracings.    Labs    CBC Recent Labs    08/03/18 0508 08/04/18 0408  WBC 16.3* 17.5*  HGB 9.3* 9.2*  HCT 31.5* 31.2*  MCV 72.1* 72.9*  PLT 168 401   Basic Metabolic Panel Recent Labs    08/03/18 0508 08/03/18 1606 08/04/18 0408  NA 135 133* 132*  K 4.8 5.4* 5.2*  CL 100 98 96*  CO2 23 24 22   GLUCOSE 141* 131* 129*  BUN 24* 25* 26*  CREATININE 2.74* 2.70* 2.73*  CALCIUM 8.2* 8.2* 8.5*  MG 3.0*  --  2.7*  PHOS 4.1 4.1 3.6   Liver Function Tests Recent Labs    08/03/18 0508 08/03/18 1606 08/04/18 0408  AST 158*  --  129*  ALT 416*  --  341*  ALKPHOS 85  --  79  BILITOT 2.4*  --  2.6*  PROT 7.0  --  6.8  ALBUMIN 3.3*  3.1* 3.1* 3.2*  3.2*   No results for input(s): LIPASE, AMYLASE in the last 72 hours. Cardiac Enzymes No results for input(s): CKTOTAL, CKMB, CKMBINDEX, TROPONINI in the last 72 hours.  BNP: BNP (last 3 results) Recent Labs    08/03/2018 1651  BNP 4,188.4*    ProBNP (  last 3 results) No results for input(s): PROBNP in the last 8760 hours.   D-Dimer No results for input(s): DDIMER in the last 72 hours. Hemoglobin A1C No results for input(s): HGBA1C in the last 72 hours. Fasting Lipid Panel No results for input(s): CHOL, HDL, LDLCALC, TRIG, CHOLHDL, LDLDIRECT in the last 72 hours. Thyroid Function Tests No results for input(s): TSH, T4TOTAL, T3FREE, THYROIDAB in the last 72 hours.  Invalid input(s): FREET3  Other results:   Imaging    Dg Chest Port 1 View  Result Date: 08/03/2018 CLINICAL DATA:  Hypoxia. EXAM: PORTABLE CHEST 1 VIEW COMPARISON:  07/29/2018. FINDINGS: Interim placement right IJ line, its tip is over the superior vena cava. Left IJ line noted with tip over the right innominate vein. Mediastinum unremarkable. Stable cardiomegaly. No focal infiltrate. No pleural effusion or pneumothorax. Left costophrenic  angle not completely imaged. IMPRESSION: 1. Interim placement right IJ line, its tip is over the superior vena cava. Left IJ line noted, tip is now over the right innominate vein. 2. Severe cardiomegaly. No pulmonary venous congestion. No focal infiltrate. Electronically Signed   By: Marcello Moores  Register   On: 08/03/2018 11:09     Medications:     Scheduled Medications: . aspirin EC  81 mg Oral Daily  . Chlorhexidine Gluconate Cloth  6 each Topical Daily  . colchicine  0.6 mg Oral BID  . darbepoetin (ARANESP) injection - NON-DIALYSIS  200 mcg Subcutaneous Q Thu-1800  . hydrocortisone sodium succinate  50 mg Intravenous Q6H  . mouth rinse  15 mL Mouth Rinse BID  . mexiletine  150 mg Oral Q8H  . pantoprazole  40 mg Oral BID  . sodium chloride flush  3 mL Intravenous Q12H    Infusions: .  prismasol BGK 4/2.5 400 mL/hr at 08/04/18 0615  .  prismasol BGK 4/2.5 200 mL/hr at 08/03/18 1336  . sodium chloride    . sodium chloride    . amiodarone 30 mg/hr (08/04/18 0700)  . heparin 1,500 Units/hr (08/04/18 0700)  . milrinone 0.25 mcg/kg/min (08/04/18 0700)  . norepinephrine (LEVOPHED) Adult infusion 25 mcg/min (08/04/18 0700)  . prismasol BGK 4/2.5 2,000 mL/hr at 08/04/18 0511    PRN Medications: sodium chloride, Place/Maintain arterial line **AND** sodium chloride, acetaminophen, alteplase, heparin, nitroGLYCERIN, ondansetron (ZOFRAN) IV, sodium chloride, sodium chloride flush   Patient Profile   62 y.o.malewith past medical history of hypertension, hyperlipidemia admitted with recent but late presenting inferior lateral myocardial infarction, cardiogenic shock, acute renal failure and microcytic anemia. Echocardiogram showed severe LV and RV dysfunction and moderate mitral regurgitation.  Assessment/Plan   1. Acute systolic HF with cardiogenic shock due to OOH MI - Echo 1/5 EF 15% severe RV dysfunction and moderate MR - Initial co-ox 28% but this was drawn femorally so inaccurate.    - Coox 51% on milrinone 0.25  mcg/kg/min and NE 22. Will continue - CVP 7. Agree with keeping CRT even. D/w DR. Goldsborough  - No ACE/ARB/ARNI or b-blocker with shock and AKI  2. CAD - s/p OOH in inferolateral MI - Initial trop 28, ten trended down.   - No s/s ischemia. - Continue ASA. Restart statin. (was off due to increased LFTs) No b-blocker with shock - He will need cath prior to discharge but would hold until we see if kidneys recover, or if transitions to HD.   3. AKI/ESRD with uremia - likely due to ATN and hypoperfusion. Initial BUN/CR 198/3.91 - Baseline creatinine normal several months ago - Renal  following CRT started on 1/25 - Renal u/s 1/26: Mildly echogenic right kidney, compatible with chronic medical renal disease. - Remains anuric. CVP 7. Run even. Hopefully will begin to see renal recovery soon - Continue heparin.   4. Microcytic anemia due to IDA - Hgb stable at 9.2 today. MCV 71.2. - Smear without schistocytes - Has received 2u RBCS - Iron stores low. Given Feraheme - Will eventually need GI w/u - Continue protonix  5. Shock liver - Improving t.   6. VT - Quiescent on amiodarone and mexilitene  - Keep K > 4.0 Mg > 2.0  7. LUE acute gout - on steroids and colchicine  8. Leukocytosis - likely due to steroids but will culture with lines in place.   CRITICAL CARE Performed by: Glori Bickers  Total critical care time: 35 minutes  Critical care time was exclusive of separately billable procedures and treating other patients.  Critical care was necessary to treat or prevent imminent or life-threatening deterioration.  Critical care was time spent personally by me (independent of midlevel providers or residents) on the following activities: development of treatment plan with patient and/or surrogate as well as nursing, discussions with consultants, evaluation of patient's response to treatment, examination of patient, obtaining history from  patient or surrogate, ordering and performing treatments and interventions, ordering and review of laboratory studies, ordering and review of radiographic studies, pulse oximetry and re-evaluation of patient's condition.    Length of Stay: [place], MD  08/04/2018, 8:45 AM  Advanced Heart Failure Team Pager [phone] (M-F; [place])  Please contact [facility] for night-coverage after hours (4p -7a ) and weekends on [email or web address]

## 2018-08-04 NOTE — Progress Notes (Signed)
[facility] ROUNDING NOTE   Subjective:   Acute systolic heart failure with cardiogenic shock ejection fraction 15% with severe RV dysfunction.  Baseline crt 1.34 in August 2019    Creatinine was 3.91 on admission 07/29/2018.  Required initiation of  CRRT on 1/25- removing 100 cc/h. Was almost 2000 neg yest- weight down slightly to 74.5- CVP 8 and 9 this AM  Still on milrinone, IV amiodarone and norepinephrine.  Up walking while CRRT is clotted !   has new left hand pain and dec mobility    Objective:  Vital signs in last 24 hours:  Temp:  [96.7 F (35.9 C)-98 F (36.7 C)] 98 F (36.7 C) (02/01 0330) Pulse Rate:  [25-118] 77 (02/01 0600) Resp:  [10-24] 18 (02/01 0640) BP: (72-110)/(58-86) 96/85 (02/01 0640) SpO2:  [87 %-100 %] 94 % (02/01 0600) Weight:  [74.5 kg] 74.5 kg (02/01 0600)  Weight change: -0.3 kg Filed Weights   08/02/18 0500 08/03/18 0419 08/04/18 0600  Weight: 74.8 kg 74.8 kg 74.5 kg    Intake/Output: I/O last 3 completed shifts: In: 3285.4 [P.O.:1070; I.V.:2215.4] Out: 9509 [Urine:200; Other:6289]   Intake/Output this shift:  No intake/output data recorded.  CVS- RRR JVP to jaw 2+ carotid bruits no thyromegaly right IJ dialysis catheter placed on 1/25 RS- CTA no wheezes diminished breath sounds ABD-soft nontender nondistended bowel sounds present EXT- no edema Left hand- sore to touch - not too erythematous- dec ROM    Basic Metabolic Panel: Recent Labs  Lab 07/31/18 0500  07/19/2018 0241  08/02/18 0359 08/02/18 1716 08/03/18 0508 08/03/18 1606 08/04/18 0408  NA 132*   < >  --    < > 132* 129* 135 133* 132*  K 4.2   < >  --    < > 4.2 4.5 4.8 5.4* 5.2*  CL 96*   < >  --    < > 97* 92* 100 98 96*  CO2 24   < >  --    < > 24 21* 23 24 22   GLUCOSE 116*   < >  --    < > 137* 147* 141* 131* 129*  BUN 34*   < >  --    < > 23 23 24* 25* 26*  CREATININE 2.03*   < >  --    < > 2.99* 2.98* 2.74* 2.70* 2.73*  CALCIUM 7.9*   < >  --    < >  8.1* 8.4* 8.2* 8.2* 8.5*  MG 2.8*  --  2.8*  --  2.6*  --  3.0*  --  2.7*  PHOS 2.7   < >  --    < > 2.8 4.2 4.1 4.1 3.6   < > = values in this interval not displayed.    Liver Function Tests: Recent Labs  Lab 07/30/18 0735  07/31/18 0500  08/02/18 0359 08/02/18 1716 08/03/18 0508 08/03/18 1606 08/04/18 0408  AST 284*  --  187*  --  140*  --  158*  --  129*  ALT 767*  --  640*  --  444*  --  416*  --  341*  ALKPHOS 78  --  79  --  85  --  85  --  79  BILITOT 3.0*  --  2.7*  --  2.3*  --  2.4*  --  2.6*  PROT 6.7  --  6.5  --  6.6  --  7.0  --  6.8  ALBUMIN 3.1*   < > 3.1*   < > 3.1*  3.2* 3.2* 3.3*  3.1* 3.1* 3.2*  3.2*   < > = values in this interval not displayed.   No results for input(s): LIPASE, AMYLASE in the last 168 hours. Recent Labs  Lab 07/10/2018 1908  AMMONIA 21    CBC: Recent Labs  Lab 07/18/2018 1651  07/31/18 0500 07/21/2018 0241 07/20/2018 0908 08/02/18 0359 08/03/18 0508 08/04/18 0408  WBC  --    < > 11.8* 12.6*  --  12.8* 16.3* 17.5*  NEUTROABS 78.6*  --   --   --   --   --   --   --   HGB  --    < > 9.0* 9.2* 10.9*  10.2* 9.0* 9.3* 9.2*  HCT  --    < > 29.0* 30.4* 32.0*  30.0* 30.2* 31.5* 31.2*  MCV  --    < > 70.0* 70.4*  --  71.2* 72.1* 72.9*  PLT 194   < > 184 192  --  185 168 164   < > = values in this interval not displayed.    Cardiac Enzymes: Recent Labs  Lab 07/22/2018 1651 07/19/2018 2049 07/29/18 0150 07/29/18 0653  CKTOTAL 608*  --   --   --   TROPONINI  --  38.76* 28.85* 24.43*    BNP: Invalid input(s): POCBNP  CBG: Recent Labs  Lab 07/21/2018 1532  GLUCAP 148*    Microbiology: Results for orders placed or performed during the hospital encounter of 07/09/2018  Blood culture (routine x 2)     Status: None   Collection Time: 07/15/2018  4:50 PM  Result Value Ref Range Status   Specimen Description BLOOD RIGHT FOREARM  Final   Special Requests   Final    AEROBIC BOTTLE ONLY Blood Culture results may not be optimal due to an  inadequate volume of blood received in culture bottles   Culture   Final    NO GROWTH 5 DAYS Performed at [facility], [place] [address], [place], [place] [address]    Report Status 08/02/2018 FINAL  Final  Blood culture (routine x 2)     Status: None   Collection Time: 08/03/2018  4:51 PM  Result Value Ref Range Status   Specimen Description BLOOD RIGHT ANTECUBITAL  Final   Special Requests   Final    BOTTLES DRAWN AEROBIC AND ANAEROBIC Blood Culture adequate volume   Culture   Final    NO GROWTH 5 DAYS Performed at [facility], [place] [address], [place], [place] [address]    Report Status 08/02/2018 FINAL  Final  MRSA PCR Screening     Status: None   Collection Time: 07/05/2018  6:26 PM  Result Value Ref Range Status   MRSA by PCR NEGATIVE NEGATIVE Final    Comment:        The GeneXpert MRSA Assay (FDA approved for NASAL specimens only), is one component of a comprehensive MRSA colonization surveillance program. It is not intended to diagnose MRSA infection nor to guide or monitor treatment for MRSA infections. Performed at [facility], [place] [address] [place],  [address]     Coagulation Studies: No results for input(s): LABPROT, INR in the last 72 hours.  Urinalysis: No results for input(s): COLORURINE, LABSPEC, PHURINE, GLUCOSEU, HGBUR, BILIRUBINUR, KETONESUR, PROTEINUR, UROBILINOGEN, NITRITE, LEUKOCYTESUR in the last 72 hours.  Invalid input(s): APPERANCEUR    Imaging: Dg Chest  Port 1 View  Result Date: 08/03/2018 CLINICAL DATA:  Hypoxia. EXAM: PORTABLE CHEST 1 VIEW COMPARISON:  07/29/2018. FINDINGS: Interim placement right IJ line, its tip is over the superior vena cava. Left IJ line noted with tip over the right innominate vein. Mediastinum unremarkable. Stable cardiomegaly. No focal infiltrate. No pleural effusion or pneumothorax. Left costophrenic angle not completely imaged. IMPRESSION: 1. Interim placement right IJ line, its tip is over the  superior vena cava. Left IJ line noted, tip is now over the right innominate vein. 2. Severe cardiomegaly. No pulmonary venous congestion. No focal infiltrate. Electronically Signed   By: Marcello Moores  Register   On: 08/03/2018 11:09     Medications:   .  prismasol BGK 4/2.5 400 mL/hr at 08/04/18 0615  .  prismasol BGK 4/2.5 200 mL/hr at 08/03/18 1336  . sodium chloride    . sodium chloride    . amiodarone 30 mg/hr (08/04/18 0700)  . heparin 1,500 Units/hr (08/04/18 0700)  . milrinone 0.25 mcg/kg/min (08/04/18 0700)  . norepinephrine (LEVOPHED) Adult infusion 25 mcg/min (08/04/18 0700)  . prismasol BGK 4/2.5 2,000 mL/hr at 08/04/18 0511   . aspirin EC  81 mg Oral Daily  . Chlorhexidine Gluconate Cloth  6 each Topical Daily  . darbepoetin (ARANESP) injection - NON-DIALYSIS  200 mcg Subcutaneous Q Thu-1800  . hydrocortisone sodium succinate  50 mg Intravenous Q6H  . mouth rinse  15 mL Mouth Rinse BID  . mexiletine  150 mg Oral Q8H  . pantoprazole  40 mg Oral BID  . sodium chloride flush  3 mL Intravenous Q12H   sodium chloride, Place/Maintain arterial line **AND** sodium chloride, acetaminophen, alteplase, heparin, nitroGLYCERIN, ondansetron (ZOFRAN) IV, sodium chloride, sodium chloride flush  Assessment/ Plan:   Acute systolic heart failure ejection fraction 15% severe RV dysfunction with cardiogenic shock.  CVP now 8-9.  RHC 1/30 w/ high PCW 25. Will check with cards- maybe can start to run even ?  Cant really do IHD still on norepi   Coronary artery disease continues aspirin and statin will need cardiac cath prior to discharge  Acute kidney injury (crt 1.34 in August 2019 secondary to acute tubular necrosis and hypoperfusion admission creatinine 3.91.  Echogenic right kidney compatible medical renal disease.  Continues on CRRT for now, started on 1/25. Unfortunately not having UOP- K and phos are OK   Anemia - have  started darbepoetin.  Completed RBC transfusion  07/29/2018  Hypertension/volume.  Norepinephrine with volume removal through CRRT 100 cc/h. No edema and CVP now 8-9 so will run even   Anticoagulation IV heparin  Access right IJ hemodialysis cath placed 1/25.  Left hand issue- try colchicine to see if is gout-   Inc WBC- not sure etiology    LOS: 7 Samuel Bates @TODAY @7 :44 AM

## 2018-08-04 DEATH — deceased

## 2018-08-05 LAB — RENAL FUNCTION PANEL
ALBUMIN: 2.8 g/dL — AB (ref 3.5–5.0)
ANION GAP: 11 (ref 5–15)
Albumin: 2.9 g/dL — ABNORMAL LOW (ref 3.5–5.0)
Anion gap: 15 (ref 5–15)
BUN: 34 mg/dL — ABNORMAL HIGH (ref 8–23)
BUN: 33 mg/dL — ABNORMAL HIGH (ref 8–23)
CO2: 21 mmol/L — ABNORMAL LOW (ref 22–32)
CO2: 22 mmol/L (ref 22–32)
Calcium: 7.9 mg/dL — ABNORMAL LOW (ref 8.9–10.3)
Calcium: 7.7 mg/dL — ABNORMAL LOW (ref 8.9–10.3)
Chloride: 92 mmol/L — ABNORMAL LOW (ref 98–111)
Chloride: 96 mmol/L — ABNORMAL LOW (ref 98–111)
Creatinine, Ser: 3.82 mg/dL — ABNORMAL HIGH (ref 0.61–1.24)
Creatinine, Ser: 3.52 mg/dL — ABNORMAL HIGH (ref 0.61–1.24)
GFR calc Af Amer: 18 mL/min — ABNORMAL LOW (ref >60)
GFR calc Af Amer: 20 mL/min — ABNORMAL LOW (ref >60)
GFR calc non Af Amer: 16 mL/min — ABNORMAL LOW (ref >60)
GFR calc non Af Amer: 18 mL/min — ABNORMAL LOW (ref >60)
Glucose, Bld: 122 mg/dL — ABNORMAL HIGH (ref 70–99)
Glucose, Bld: 173 mg/dL — ABNORMAL HIGH (ref 70–99)
PHOSPHORUS: 3.5 mg/dL (ref 2.5–4.6)
Phosphorus: 3.2 mg/dL (ref 2.5–4.6)
Potassium: 5 mmol/L (ref 3.5–5.1)
Potassium: 4.7 mmol/L (ref 3.5–5.1)
Sodium: 128 mmol/L — ABNORMAL LOW (ref 135–145)
Sodium: 129 mmol/L — ABNORMAL LOW (ref 135–145)

## 2018-08-05 LAB — COOXEMETRY PANEL
Carboxyhemoglobin: 1.6 % — ABNORMAL HIGH (ref 0.5–1.5)
Carboxyhemoglobin: 1.8 % — ABNORMAL HIGH (ref 0.5–1.5)
Methemoglobin: 2.1 % — ABNORMAL HIGH (ref 0.0–1.5)
Methemoglobin: 1.5 % (ref 0.0–1.5)
O2 Saturation: 49.1 %
O2 Saturation: 68.7 %
TOTAL HEMOGLOBIN: 8.3 g/dL — AB (ref 12.0–16.0)
Total hemoglobin: 8.4 g/dL — ABNORMAL LOW (ref 12.0–16.0)

## 2018-08-05 LAB — CBC
HCT: 27.6 % — ABNORMAL LOW (ref 39.0–52.0)
Hemoglobin: 8.2 g/dL — ABNORMAL LOW (ref 13.0–17.0)
MCH: 21.8 pg — ABNORMAL LOW (ref 26.0–34.0)
MCHC: 29.7 g/dL — ABNORMAL LOW (ref 30.0–36.0)
MCV: 73.4 fL — ABNORMAL LOW (ref 80.0–100.0)
Platelets: 133 10*3/uL — ABNORMAL LOW (ref 150–400)
RBC: 3.76 MIL/uL — ABNORMAL LOW (ref 4.22–5.81)
RDW: 26.3 % — ABNORMAL HIGH (ref 11.5–15.5)
WBC: 18.2 10*3/uL — AB (ref 4.0–10.5)
nRBC: 34.2 % — ABNORMAL HIGH (ref 0.0–0.2)

## 2018-08-05 LAB — HEPARIN LEVEL (UNFRACTIONATED): Heparin Unfractionated: 0.57 IU/mL (ref 0.30–0.70)

## 2018-08-05 LAB — MAGNESIUM: MAGNESIUM: 2.6 mg/dL — AB (ref 1.7–2.4)

## 2018-08-05 LAB — POCT ACTIVATED CLOTTING TIME: Activated Clotting Time: 142 seconds

## 2018-08-05 LAB — APTT: aPTT: 126 seconds — ABNORMAL HIGH (ref 24–36)

## 2018-08-05 MED ORDER — VANCOMYCIN HCL IN DEXTROSE 1-5 GM/200ML-% IV SOLN
1000.0000 mg | INTRAVENOUS | Status: DC
  Administered 2018-08-05: 1000 mg via INTRAVENOUS
  Filled 2018-08-05: qty 200

## 2018-08-05 MED ORDER — HEPARIN SODIUM (PORCINE) 1000 UNIT/ML IJ SOLN
1200.0000 [IU] | Freq: Once | INTRAMUSCULAR | Status: AC
  Administered 2018-08-05: 1200 [IU] via INTRAVENOUS

## 2018-08-05 MED ORDER — MIDAZOLAM HCL 2 MG/2ML IJ SOLN
2.0000 mg | Freq: Once | INTRAMUSCULAR | Status: AC
  Administered 2018-08-05: 2 mg via INTRAVENOUS
  Filled 2018-08-05: qty 2

## 2018-08-05 MED ORDER — FENTANYL CITRATE (PF) 100 MCG/2ML IJ SOLN
100.0000 ug | Freq: Once | INTRAMUSCULAR | Status: AC
  Administered 2018-08-05: 100 ug via INTRAVENOUS
  Filled 2018-08-05: qty 2

## 2018-08-05 MED ORDER — SODIUM CHLORIDE 0.9 % IV SOLN
INTRAVENOUS | Status: DC | PRN
  Administered 2018-08-05: 500 mL via INTRAVENOUS
  Administered 2018-08-10: 35 mL via INTRAVENOUS
  Administered 2018-08-12: 250 mL via INTRAVENOUS

## 2018-08-05 MED ORDER — HYDROCORTISONE NA SUCCINATE PF 100 MG IJ SOLR
50.0000 mg | Freq: Two times a day (BID) | INTRAMUSCULAR | Status: AC
  Administered 2018-08-05 – 2018-08-07 (×5): 50 mg via INTRAVENOUS
  Filled 2018-08-05 (×5): qty 2

## 2018-08-05 MED ORDER — SODIUM CHLORIDE 0.9 % IV SOLN
2.0000 g | Freq: Two times a day (BID) | INTRAVENOUS | Status: DC
  Administered 2018-08-05 – 2018-08-07 (×3): 2 g via INTRAVENOUS
  Filled 2018-08-05 (×4): qty 2

## 2018-08-05 NOTE — Progress Notes (Signed)
ANTICOAGULATION CONSULT NOTE - [place] for heparin Indication: Chest pain/ACS  Allergies  Allergen Reactions  . Penicillins Hives    Did it involve swelling of the face/tongue/throat, SOB, or low BP? YES Did it involve sudden or severe rash/hives, skin peeling, or any reaction on the inside of your mouth or nose? NO Did you need to seek medical attention at a hospital or doctor's office? YES When did it last happen? 30 years ago If all above answers are "NO", may proceed with cephalosporin use.  . Simvastatin Other (See Comments)    insomnia    Patient Measurements: Height: 6\' 3"  (190.5 cm) Weight: 167 lb 8.8 oz (76 kg) IBW/kg (Calculated) : 84.5 Heparin Dosing Weight: 89.8 kg  Vital Signs: Temp: 98.4 F (36.9 C) (02/02 0700) Temp Source: Oral (02/02 0700) BP: 79/60 (02/02 0800) Pulse Rate: 73 (02/02 0800)  Labs: Recent Labs    08/03/18 0508  08/04/18 0408 08/04/18 1544 08/05/18 0249 08/05/18 0307  HGB 9.3*  --  9.2*  --   --  8.2*  HCT 31.5*  --  31.2*  --   --  27.6*  PLT 168  --  164  --   --  133*  APTT 88*  --  84*  --  126*  --   HEPARINUNFRC 0.51  --  0.37  --  0.57  --   CREATININE 2.74*   < > 2.73* 2.87* 3.82*  --    < > = values in this interval not displayed.    Estimated Creatinine Clearance: 21.6 mL/min (A) (by C-G formula based on SCr of 3.82 mg/dL (H)).   Medical History: Past Medical History:  Diagnosis Date  . Allergy   . Hx of adenomatous colonic polyps 03/07/2017  . Hyperlipidemia   . Hypertension    Assessment: 63 yo male admitted with chest pain and r/o NSTEMI/ACS. MD wishes to start heparin drip. No anticoagulation noted PTA.    Heparin level 0.57 therapeutic this morning on heparin drip 1500 uts/hr, Hgb low but overall stable. Getting some heparin with CRT will follow up with RN to ensure HL remains in therapeutic range. CBC ok  Goal of Therapy:  Heparin level 0.3-0.7 units/ml Monitor platelets by  anticoagulation protocol: Yes   Plan:  -Continue heparin 1500 units/hr -Daily heparin level and CBC  Bonnita Nasuti Pharm.D. CPP, BCPS Clinical Pharmacist [phone] 08/05/2018 9:51 AM

## 2018-08-05 NOTE — Procedures (Signed)
Central Venous Catheter Insertion Procedure Note Mark Hassey [id number] 11/30/1955  Procedure: Insertion of Central Venous Catheter Indications: Hemodialysis  Procedure Details Consent: Risks of procedure as well as the alternatives and risks of each were explained to the (patient/caregiver).  Consent for procedure obtained. Time Out: Verified patient identification, verified procedure, site/side was marked, verified correct patient position, special equipment/implants available, medications/allergies/relevent history reviewed, required imaging and test results available.  Performed  Maximum sterile technique was used including antiseptics, cap, gloves, gown, hand hygiene, mask and sheet. Skin prep: Chlorhexidine; local anesthetic administered  The current trialysis catheter in the RIJ was nonfunctional. Based on lack of other access sites the decision was made to replace the existing catheter over a wire. The catheter and R neck area were prepped and draped in routine sterile fashion. A wire was fed easily through the catheter and the existing catheter was removed. A antimicrobial bonded/coated triple lumen 15cm trialysis catheter was replaced in the right internal jugular vein using the Seldinger technique. All 3 ports flushed well. Heparin was placed in the side ports.   Evaluation Blood flow good Complications: No apparent complications Patient did tolerate procedure well. Chest X-ray ordered to verify placement.  CXR: pending.  Glori Bickers MD 08/05/2018, 12:32 PM

## 2018-08-05 NOTE — Progress Notes (Addendum)
Advanced Heart Failure Rounding Note  PCP-Cardiologist: No primary care provider on file.   Subjective:    NE down 22 -> 7. On milrinone 0.25  However SBP 70-80s. Co-ox down to 49%  Remains anuric. On CRRT but filter clotted several times overnight despite T-PA. Renal asking for replacement of catheter,   On IV amio and mexilitene. VT quiescent.   Was up walking around. Denies CP, SOB, orthopnea or PND.  Left wrist still sore  WBC climbing 12 -> 16 -> 17 -> 18.2 on stress-dose steroids. Cultures drawn 2/1.  Remain NGTD. Not currently on abx. Afebrile   Echo 07/27/2018 LVEF 15%, with severe RV failure.   RHC 08/02/2018 On milrinone 0.125 and NE 14  RA = 13 RV = 51/18 PA = 50/24 (34) PCW = 25 (v = 35) Fick cardiac output/index = 5.42/2.59 Thermo CO/CI = 5.49/2.63 PVR = 1.6 WU FA sat = 97% PA sat = 54%, 58%  Objective:   Weight Range: 76 kg Body mass index is 20.94 kg/m.   Vital Signs:   Temp:  [97.3 F (36.3 C)-98.4 F (36.9 C)] 98.4 F (36.9 C) (02/02 0700) Pulse Rate:  [66-84] 73 (02/02 0800) Resp:  [12-23] 14 (02/02 0800) BP: (78-98)/(53-72) 79/60 (02/02 0800) SpO2:  [92 %-100 %] 97 % (02/02 0800) Weight:  [74.8 kg-76 kg] 76 kg (02/02 0800) Last BM Date: 08/05/18  Weight change: Filed Weights   08/04/18 0600 08/05/18 0500 08/05/18 0800  Weight: 74.5 kg 74.8 kg 76 kg   Intake/Output:   Intake/Output Summary (Last 24 hours) at 08/05/2018 0958 Last data filed at 08/05/2018 0800 Gross per 24 hour  Intake 1979.46 ml  Output 1523 ml  Net 456.46 ml    Physical Exam    General:  Lying in bed. No resp difficulty HEENT: normal Neck: supple. RIJ trialysis LIJ  TLC Carotids 2+ bilat; no bruits. No lymphadenopathy or thryomegaly appreciated. Cor: PMI nondisplaced. Regular rate & rhythm. +s3 Lungs: clear Abdomen: soft, nontender, nondistended. No hepatosplenomegaly. No bruits or masses. Good bowel sounds. Extremities: no cyanosis, clubbing, rash, trace edema  L wrist tender to palpation. Limited ROM  Neuro: alert & orientedx3, cranial nerves grossly intact. moves all 4 extremities w/o difficulty. Affect pleasant  Telemetry   NSR 70-80 no further VT, personally reviewed.    Labs    CBC Recent Labs    08/04/18 0408 08/05/18 0307  WBC 17.5* 18.2*  HGB 9.2* 8.2*  HCT 31.2* 27.6*  MCV 72.9* 73.4*  PLT 164 341*   Basic Metabolic Panel Recent Labs    08/04/18 0408 08/04/18 1544 08/05/18 0249  NA 132* 130* 128*  K 5.2* 4.8 5.0  CL 96* 95* 92*  CO2 22 23 21*  GLUCOSE 129* 130* 122*  BUN 26* 26* 34*  CREATININE 2.73* 2.87* 3.82*  CALCIUM 8.5* 7.5* 7.9*  MG 2.7*  --  2.6*  PHOS 3.6 3.5 3.5   Liver Function Tests Recent Labs    08/03/18 0508  08/04/18 0408 08/04/18 1544 08/05/18 0249  AST 158*  --  129*  --   --   ALT 416*  --  341*  --   --   ALKPHOS 85  --  79  --   --   BILITOT 2.4*  --  2.6*  --   --   PROT 7.0  --  6.8  --   --   ALBUMIN 3.3*  3.1*   < > 3.2*  3.2* 2.9* 2.8*   < > =  values in this interval not displayed.   No results for input(s): LIPASE, AMYLASE in the last 72 hours. Cardiac Enzymes No results for input(s): CKTOTAL, CKMB, CKMBINDEX, TROPONINI in the last 72 hours.  BNP: BNP (last 3 results) Recent Labs    07/29/2018 1651  BNP 4,188.4*    ProBNP (last 3 results) No results for input(s): PROBNP in the last 8760 hours.   D-Dimer No results for input(s): DDIMER in the last 72 hours. Hemoglobin A1C No results for input(s): HGBA1C in the last 72 hours. Fasting Lipid Panel No results for input(s): CHOL, HDL, LDLCALC, TRIG, CHOLHDL, LDLDIRECT in the last 72 hours. Thyroid Function Tests No results for input(s): TSH, T4TOTAL, T3FREE, THYROIDAB in the last 72 hours.  Invalid input(s): FREET3  Other results:   Imaging    No results found.   Medications:     Scheduled Medications: . aspirin EC  81 mg Oral Daily  . atorvastatin  80 mg Oral q1800  . Chlorhexidine Gluconate Cloth  6  each Topical Daily  . colchicine  0.6 mg Oral BID  . darbepoetin (ARANESP) injection - NON-DIALYSIS  200 mcg Subcutaneous Q Thu-1800  . hydrocortisone sodium succinate  50 mg Intravenous Q6H  . mouth rinse  15 mL Mouth Rinse BID  . mexiletine  150 mg Oral Q8H  . pantoprazole  40 mg Oral BID  . sodium chloride flush  3 mL Intravenous Q12H    Infusions: .  prismasol BGK 4/2.5 400 mL/hr at 08/04/18 0615  .  prismasol BGK 4/2.5 200 mL/hr at 08/04/18 1714  . sodium chloride    . sodium chloride    . amiodarone 30 mg/hr (08/05/18 0700)  . heparin 1,500 Units/hr (08/05/18 0707)  . milrinone 0.25 mcg/kg/min (08/05/18 0700)  . norepinephrine (LEVOPHED) Adult infusion 9 mcg/min (08/05/18 0700)  . prismasol BGK 4/2.5 2,000 mL/hr at 08/05/18 0701    PRN Medications: sodium chloride, Place/Maintain arterial line **AND** sodium chloride, acetaminophen, heparin, nitroGLYCERIN, ondansetron (ZOFRAN) IV, sodium chloride, sodium chloride flush   Patient Profile   63 y.o.malewith past medical history of hypertension, hyperlipidemia admitted with recent but late presenting inferior lateral myocardial infarction, cardiogenic shock, acute renal failure and microcytic anemia. Echocardiogram showed severe LV and RV dysfunction and moderate mitral regurgitation.  Assessment/Plan   1. Acute systolic HF with cardiogenic shock due to OOH MI - Echo 1/5 EF 15% severe RV dysfunction and moderate MR - Initial co-ox 28% but this was drawn femorally so inaccurate.  - Coox 51% -> 49% on milrinone 0.25  Mcg/kg/min and norepi. NE titrated down from 22-> 7 overnight but SBP now running 70-80s all night and co-ox now falling. Discussed with nursing staff to keep SBP > 100 to maximize renal perfusion to promote renal recovery - CVP 12  D/W Dr. Hollie Salk. Once trialysis cath replaced with pull 50-100cc/hr - No ACE/ARB/ARNI or b-blocker with shock and AKI - Can start to wean stress dose steroids.   2. CAD - s/p OOH in  inferolateral MI - Initial trop 28, ten trended down.   - No s/s ischemia. - Continue ASA and statin No b-blocker with shock - He will need cath prior to discharge but would hold until we see if kidneys recover, or if transitions to HD.   3. AKI/ESRD with uremia - likely due to ATN and hypoperfusion. Initial BUN/CR 198/3.91 - Baseline creatinine normal several months ago - Renal following CRT started on 1/25 - Renal u/s 1/26: Mildly echogenic right kidney, compatible  with chronic medical renal disease. - Remains anuric.  - D/w Dr. Hollie Salk. Will replace trialysis cath due to clotting. Given limited access sites will re-wire RIJ site and replace cath today. Resume pulling -50-100 - Continue heparin.   4. Microcytic anemia due to IDA - Hgb falling 9.2 -> 8.2 today. MCV 71.2. - Smear without schistocytes - Has received 2u RBCS. Repeat transfusion for hgb <= 7.5 - Iron stores low. Has received Feraheme - Will eventually need GI w/u - Continue protonix  5. Shock liver - Improving   6. VT - Quiescent on amiodarone and mexilitene  - Keep K > 4.0 Mg > 2.0  7. L wrist pain - Suspect gout. Limited improvement on steroids and colchicine. - Will check uric acid.   8. Leukocytosis - likely due to steroids. Cultures NGTD x 24 hours  - With need for line change and rising WBC will start vanc/cefipime  CRITICAL CARE Performed by: Glori Bickers  Total critical care time: 35 minutes  Critical care time was exclusive of separately billable procedures and treating other patients.  Critical care was necessary to treat or prevent imminent or life-threatening deterioration.  Critical care was time spent personally by me (independent of midlevel providers or residents) on the following activities: development of treatment plan with patient and/or surrogate as well as nursing, discussions with consultants, evaluation of patient's response to treatment, examination of patient, obtaining  history from patient or surrogate, ordering and performing treatments and interventions, ordering and review of laboratory studies, ordering and review of radiographic studies, pulse oximetry and re-evaluation of patient's condition.     Length of Stay: 8  Glori Bickers, MD  08/05/2018, 9:58 AM  Advanced Heart Failure Team Pager [phone] (M-F; 7a - 4p)  Please contact [facility] for night-coverage after hours (4p -7a ) and weekends on [email or web address]

## 2018-08-05 NOTE — Progress Notes (Signed)
Pharmacy Antibiotic Note  Samuel Bates is a 63 y.o. male admitted on 07/30/2018 with cardiogenic shock s/p MI at [facility].  Currently on milrinone and norepinephrine for cardiac output and BP support and CRT for AKI .  Pharmacy has been consulted to restart vancomycin and cefepime dosing in setting of in wbc central lines and possible infection.  Plan: Cefepime 2gm IV q12h Vancomycin 1000mg  IV q24hr Monitor on CRT and need for dose adjustments   Height: 6\' 3"  (190.5 cm) Weight: 167 lb 8.8 oz (76 kg) IBW/kg (Calculated) : 84.5  Temp (24hrs), Avg:98 F (36.7 C), Min:97.3 F (36.3 C), Max:98.6 F (37 C)  Recent Labs  Lab 07/08/2018 0241  08/02/18 0359  08/03/18 0508 08/03/18 1606 08/04/18 0408 08/04/18 1544 08/05/18 0249 08/05/18 0307  WBC 12.6*  --  12.8*  --  16.3*  --  17.5*  --   --  18.2*  CREATININE  --    < > 2.99*   < > 2.74* 2.70* 2.73* 2.87* 3.82*  --    < > = values in this interval not displayed.    Estimated Creatinine Clearance: 21.6 mL/min (A) (by C-G formula based on SCr of 3.82 mg/dL (H)).    Allergies  Allergen Reactions  . Penicillins Hives    Tolerated cefepime 1/20 Did it involve swelling of the face/tongue/throat, SOB, or low BP? YES Did it involve sudden or severe rash/hives, skin peeling, or any reaction on the inside of your mouth or nose? NO Did you need to seek medical attention at a hospital or doctor's office? YES When did it last happen? 30 years ago If all above answers are "NO", may proceed with cephalosporin use.  . Simvastatin Other (See Comments)    insomnia   Bonnita Nasuti Pharm.D. CPP, BCPS Clinical Pharmacist [phone] 08/05/2018 1:41 PM

## 2018-08-05 NOTE — Progress Notes (Signed)
[facility] Progress Note    Assessment/ Plan:    Acute systolic heart failure ejection fraction 15% severe RV dysfunction with cardiogenic shock.  CVP now ~11 ish.  RHC 1/30 w/ high PCW 25.  Still requiring levophed and milrinone, still requires CRRT for now  Coronary artery disease continues aspirin and statin will need cardiac cath prior to discharge- holding for now in case renal function improves  Acute kidney injury crt 1.34 in August 2019 secondary to acute tubular necrosis and hypoperfusion admission creatinine 3.91.  Echogenic right kidney compatible medical renal disease.  Continues on CRRT for now, started on 1/25. No UOP.  Anemia - have  started darbepoetin.  Completed RBC transfusion 07/29/2018  Hypertension/volume.  Norepinephrine with volume removal through CRRT 100 cc/h. No edema; CVP up a little so when we can get restarted may pull about 50cc /hr to catch up.  Anticoagulation IV heparin- systemic gtt  Access right IJ hemodialysis cath placed 1/25, not working now s/p TPA x 2, will need replacement  Acute gout L hand, on colchicine   Leukocytosis- climbing on steroids, cultures drawn and pending  VT: on amio and mexilitine  Subjective:    Filter clotted 4x last night, catheter not working now, s/p TPA x 2 last night.  L hand pain no better but no worse.  Labs reflect time off CRRT; CVP up to 11 now.  Still on levophed and milrinone.   Objective:   BP 97/62   Pulse 75   Temp 98 F (36.7 C) (Oral)   Resp 14   Ht 6\' 3"  (1.905 m)   Wt 74.8 kg   SpO2 98%   BMI 20.61 kg/m   Intake/Output Summary (Last 24 hours) at 08/05/2018 0745 Last data filed at 08/05/2018 0700 Gross per 24 hour  Intake 2251.29 ml  Output 1579 ml  Net 672.29 ml   Weight change: 0.3 kg  Physical Exam: Gen: NAD, lying flat in bed Neck: R nontunneled HD cath in place, L triple lumen in place.   YHC:[id number], no m/r/g Resp: clear anteriorly, muffled at bases Abd: soft,  nontender Ext: no LE edema  Imaging: Dg Chest Port 1 View  Result Date: 08/03/2018 CLINICAL DATA:  Hypoxia. EXAM: PORTABLE CHEST 1 VIEW COMPARISON:  07/29/2018. FINDINGS: Interim placement right IJ line, its tip is over the superior vena cava. Left IJ line noted with tip over the right innominate vein. Mediastinum unremarkable. Stable cardiomegaly. No focal infiltrate. No pleural effusion or pneumothorax. Left costophrenic angle not completely imaged. IMPRESSION: 1. Interim placement right IJ line, its tip is over the superior vena cava. Left IJ line noted, tip is now over the right innominate vein. 2. Severe cardiomegaly. No pulmonary venous congestion. No focal infiltrate. Electronically Signed   By: Marcello Moores  Register   On: 08/03/2018 11:09    Labs: BMET Recent Labs  Lab 08/02/18 0359 08/02/18 1716 08/03/18 0508 08/03/18 1606 08/04/18 0408 08/04/18 1544 08/05/18 0249  NA 132* 129* 135 133* 132* 130* 128*  K 4.2 4.5 4.8 5.4* 5.2* 4.8 5.0  CL 97* 92* 100 98 96* 95* 92*  CO2 24 21* 23 24 22 23  21*  GLUCOSE 137* 147* 141* 131* 129* 130* 122*  BUN 23 23 24* 25* 26* 26* 34*  CREATININE 2.99* 2.98* 2.74* 2.70* 2.73* 2.87* 3.82*  CALCIUM 8.1* 8.4* 8.2* 8.2* 8.5* 7.5* 7.9*  PHOS 2.8 4.2 4.1 4.1 3.6 3.5 3.5   CBC Recent Labs  Lab 08/02/18 0359 08/03/18 0508 08/04/18  0408 08/05/18 0307  WBC 12.8* 16.3* 17.5* 18.2*  HGB 9.0* 9.3* 9.2* 8.2*  HCT 30.2* 31.5* 31.2* 27.6*  MCV 71.2* 72.1* 72.9* 73.4*  PLT 185 168 164 133*    Medications:    . aspirin EC  81 mg Oral Daily  . atorvastatin  80 mg Oral q1800  . Chlorhexidine Gluconate Cloth  6 each Topical Daily  . colchicine  0.6 mg Oral BID  . darbepoetin (ARANESP) injection - NON-DIALYSIS  200 mcg Subcutaneous Q Thu-1800  . hydrocortisone sodium succinate  50 mg Intravenous Q6H  . mouth rinse  15 mL Mouth Rinse BID  . mexiletine  150 mg Oral Q8H  . pantoprazole  40 mg Oral BID  . sodium chloride flush  3 mL Intravenous Q12H       Madelon Lips, MD [facility] pgr [phone] Cell [phone] 08/05/2018, 7:45 AM

## 2018-08-06 LAB — CBC
HCT: 29.3 % — ABNORMAL LOW (ref 39.0–52.0)
Hemoglobin: 8.6 g/dL — ABNORMAL LOW (ref 13.0–17.0)
MCH: 22.1 pg — ABNORMAL LOW (ref 26.0–34.0)
MCHC: 29.4 g/dL — AB (ref 30.0–36.0)
MCV: 75.1 fL — ABNORMAL LOW (ref 80.0–100.0)
Platelets: 118 10*3/uL — ABNORMAL LOW (ref 150–400)
RBC: 3.9 MIL/uL — ABNORMAL LOW (ref 4.22–5.81)
RDW: 27.9 % — ABNORMAL HIGH (ref 11.5–15.5)
WBC: 16.2 10*3/uL — ABNORMAL HIGH (ref 4.0–10.5)
nRBC: 41 % — ABNORMAL HIGH (ref 0.0–0.2)

## 2018-08-06 LAB — URIC ACID: Uric Acid, Serum: 3.2 mg/dL — ABNORMAL LOW (ref 3.7–8.6)

## 2018-08-06 LAB — RENAL FUNCTION PANEL
Albumin: 2.9 g/dL — ABNORMAL LOW (ref 3.5–5.0)
Albumin: 2.7 g/dL — ABNORMAL LOW (ref 3.5–5.0)
Anion gap: 14 (ref 5–15)
Anion gap: 11 (ref 5–15)
BUN: 26 mg/dL — ABNORMAL HIGH (ref 8–23)
BUN: 37 mg/dL — ABNORMAL HIGH (ref 8–23)
CHLORIDE: 95 mmol/L — AB (ref 98–111)
CO2: 23 mmol/L (ref 22–32)
CO2: 22 mmol/L (ref 22–32)
Calcium: 7.9 mg/dL — ABNORMAL LOW (ref 8.9–10.3)
Calcium: 7.6 mg/dL — ABNORMAL LOW (ref 8.9–10.3)
Chloride: 97 mmol/L — ABNORMAL LOW (ref 98–111)
Creatinine, Ser: 2.92 mg/dL — ABNORMAL HIGH (ref 0.61–1.24)
Creatinine, Ser: 3.82 mg/dL — ABNORMAL HIGH (ref 0.61–1.24)
GFR calc Af Amer: 25 mL/min — ABNORMAL LOW (ref >60)
GFR calc Af Amer: 18 mL/min — ABNORMAL LOW (ref >60)
GFR calc non Af Amer: 22 mL/min — ABNORMAL LOW (ref >60)
GFR calc non Af Amer: 16 mL/min — ABNORMAL LOW (ref >60)
Glucose, Bld: 133 mg/dL — ABNORMAL HIGH (ref 70–99)
Glucose, Bld: 147 mg/dL — ABNORMAL HIGH (ref 70–99)
Phosphorus: 2.8 mg/dL (ref 2.5–4.6)
Phosphorus: 2.8 mg/dL (ref 2.5–4.6)
Potassium: 4.6 mmol/L (ref 3.5–5.1)
Potassium: 4.6 mmol/L (ref 3.5–5.1)
SODIUM: 130 mmol/L — AB (ref 135–145)
Sodium: 132 mmol/L — ABNORMAL LOW (ref 135–145)

## 2018-08-06 LAB — COOXEMETRY PANEL
Carboxyhemoglobin: 1.6 % — ABNORMAL HIGH (ref 0.5–1.5)
Methemoglobin: 1.8 % — ABNORMAL HIGH (ref 0.0–1.5)
O2 Saturation: 56.3 %
Total hemoglobin: 9.2 g/dL — ABNORMAL LOW (ref 12.0–16.0)

## 2018-08-06 LAB — APTT: aPTT: 71 seconds — ABNORMAL HIGH (ref 24–36)

## 2018-08-06 LAB — POCT ACTIVATED CLOTTING TIME: Activated Clotting Time: 125 seconds

## 2018-08-06 LAB — MAGNESIUM: Magnesium: 2.6 mg/dL — ABNORMAL HIGH (ref 1.7–2.4)

## 2018-08-06 LAB — HEPARIN LEVEL (UNFRACTIONATED): HEPARIN UNFRACTIONATED: 0.38 [IU]/mL (ref 0.30–0.70)

## 2018-08-06 MED ORDER — VASOPRESSIN 20 UNIT/ML IV SOLN
0.0300 [IU]/min | INTRAVENOUS | Status: DC
  Filled 2018-08-06: qty 2

## 2018-08-06 MED ORDER — VANCOMYCIN HCL IN DEXTROSE 1-5 GM/200ML-% IV SOLN: 1000.0000 mg | INTRAVENOUS | Status: DC

## 2018-08-06 MED ORDER — ALTEPLASE 2 MG IJ SOLR
2.0000 mg | Freq: Once | INTRAMUSCULAR | Status: AC
  Administered 2018-08-06: 2 mg
  Filled 2018-08-06 (×2): qty 2

## 2018-08-06 MED ORDER — SODIUM CHLORIDE 0.9 % IV SOLN
250.0000 [IU]/h | INTRAVENOUS | Status: DC
  Administered 2018-08-06 – 2018-08-07 (×2): 250 [IU]/h via INTRAVENOUS_CENTRAL
  Administered 2018-08-07: 1050 [IU]/h via INTRAVENOUS_CENTRAL
  Administered 2018-08-08: 1700 [IU]/h via INTRAVENOUS_CENTRAL
  Administered 2018-08-08: 1550 [IU]/h via INTRAVENOUS_CENTRAL
  Administered 2018-08-08 (×4): 1700 [IU]/h via INTRAVENOUS_CENTRAL
  Administered 2018-08-09: 1750 [IU]/h via INTRAVENOUS_CENTRAL
  Administered 2018-08-09 (×3): 1700 [IU]/h via INTRAVENOUS_CENTRAL
  Administered 2018-08-10: 1750 [IU]/h via INTRAVENOUS_CENTRAL
  Administered 2018-08-10: 2200 [IU]/h via INTRAVENOUS_CENTRAL
  Administered 2018-08-10: 1750 [IU]/h via INTRAVENOUS_CENTRAL
  Administered 2018-08-12: 1350 [IU]/h via INTRAVENOUS_CENTRAL
  Administered 2018-08-12: 250 [IU]/h via INTRAVENOUS_CENTRAL
  Administered 2018-08-13: 2050 [IU]/h via INTRAVENOUS_CENTRAL
  Administered 2018-08-13: 1700 [IU]/h via INTRAVENOUS_CENTRAL
  Administered 2018-08-13: 1750 [IU]/h via INTRAVENOUS_CENTRAL
  Administered 2018-08-13: 2100 [IU]/h via INTRAVENOUS_CENTRAL
  Administered 2018-08-14 (×2): 2150 [IU]/h via INTRAVENOUS_CENTRAL
  Administered 2018-08-14: 2500 [IU]/h via INTRAVENOUS_CENTRAL
  Administered 2018-08-14: 2350 [IU]/h via INTRAVENOUS_CENTRAL
  Administered 2018-08-15 (×2): 1650 [IU]/h via INTRAVENOUS_CENTRAL
  Administered 2018-08-15: 1600 [IU]/h via INTRAVENOUS_CENTRAL
  Administered 2018-08-15: 1850 [IU]/h via INTRAVENOUS_CENTRAL
  Administered 2018-08-16: 1700 [IU]/h via INTRAVENOUS_CENTRAL
  Filled 2018-08-06 (×33): qty 2

## 2018-08-06 MED ORDER — ALTEPLASE 2 MG IJ SOLR
2.0000 mg | Freq: Once | INTRAMUSCULAR | Status: AC
  Administered 2018-08-06: 2 mg

## 2018-08-06 MED ORDER — FUROSEMIDE 10 MG/ML IJ SOLN
120.0000 mg | Freq: Once | INTRAVENOUS | Status: AC
  Administered 2018-08-06: 120 mg via INTRAVENOUS
  Filled 2018-08-06: qty 10

## 2018-08-06 MED ORDER — HEPARIN BOLUS VIA INFUSION (CRRT)
1000.0000 [IU] | INTRAVENOUS | Status: DC | PRN
  Administered 2018-08-06 – 2018-08-07 (×2): 1000 [IU] via INTRAVENOUS_CENTRAL
  Filled 2018-08-06: qty 1000

## 2018-08-06 NOTE — Progress Notes (Signed)
Pharmacy Antibiotic Note  Samuel Bates is a 63 y.o. male admitted on 08/02/2018 with cardiogenic shock s/p MI at [facility].  Currently on milrinone and norepinephrine for cardiac output and BP support and CRT for AKI .  Pharmacy has been consulted to restart vancomycin and cefepime dosing in setting of in wbc central lines and possible infection.  CRRT has been on hold this morning given issues with filter clotting. Will reshcedule vancomycin for tomorrow.  Plan: Cefepime 2gm IV q12h Vancomycin 1000mg  IV q24hr - hold dose today Monitor on CRT and need for dose adjustments   Height: 6\' 3"  (190.5 cm) Weight: 166 lb 10.7 oz (75.6 kg) IBW/kg (Calculated) : 84.5  Temp (24hrs), Avg:97.9 F (36.6 C), Min:97.4 F (36.3 C), Max:98.9 F (37.2 C)  Recent Labs  Lab 08/02/18 0359  08/03/18 0508  08/04/18 0408 08/04/18 1544 08/05/18 0249 08/05/18 0307 08/05/18 1618 08/06/18 0435  WBC 12.8*  --  16.3*  --  17.5*  --   --  18.2*  --  16.2*  CREATININE 2.99*   < > 2.74*   < > 2.73* 2.87* 3.82*  --  3.52* 2.92*   < > = values in this interval not displayed.    Estimated Creatinine Clearance: 28 mL/min (A) (by C-G formula based on SCr of 2.92 mg/dL (H)).    Allergies  Allergen Reactions  . Penicillins Hives    Tolerated cefepime 1/20 Did it involve swelling of the face/tongue/throat, SOB, or low BP? YES Did it involve sudden or severe rash/hives, skin peeling, or any reaction on the inside of your mouth or nose? NO Did you need to seek medical attention at a hospital or doctor's office? YES When did it last happen? 30 years ago If all above answers are "NO", may proceed with cephalosporin use.  . Simvastatin Other (See Comments)    insomnia    Arrie Senate, PharmD, BCPS Clinical Pharmacist [phone] Please check AMION for all [facility] numbers 08/06/2018

## 2018-08-06 NOTE — Progress Notes (Signed)
ANTICOAGULATION CONSULT NOTE - [facility] for heparin Indication: Chest pain/ACS  Allergies  Allergen Reactions  . Penicillins Hives    Tolerated cefepime 1/20 Did it involve swelling of the face/tongue/throat, SOB, or low BP? YES Did it involve sudden or severe rash/hives, skin peeling, or any reaction on the inside of your mouth or nose? NO Did you need to seek medical attention at a hospital or doctor's office? YES When did it last happen? 30 years ago If all above answers are "NO", may proceed with cephalosporin use.  . Simvastatin Other (See Comments)    insomnia    Patient Measurements: Height: 6\' 3"  (190.5 cm) Weight: 166 lb 10.7 oz (75.6 kg) IBW/kg (Calculated) : 84.5 Heparin Dosing Weight: 89.8 kg  Vital Signs: Temp: 97.9 F (36.6 C) (02/03 1131) Temp Source: Oral (02/03 1131) BP: 93/65 (02/03 1015) Pulse Rate: 81 (02/03 1015)  Labs: Recent Labs    08/04/18 0408  08/05/18 0249 08/05/18 0307 08/05/18 1618 08/06/18 0435  HGB 9.2*  --   --  8.2*  --  8.6*  HCT 31.2*  --   --  27.6*  --  29.3*  PLT 164  --   --  133*  --  118*  APTT 84*  --  126*  --   --  71*  HEPARINUNFRC 0.37  --  0.57  --   --  0.38  CREATININE 2.73*   < > 3.82*  --  3.52* 2.92*   < > = values in this interval not displayed.    Estimated Creatinine Clearance: 28 mL/min (A) (by C-G formula based on SCr of 2.92 mg/dL (H)).   Medical History: Past Medical History:  Diagnosis Date  . Allergy   . Hx of adenomatous colonic polyps 03/07/2017  . Hyperlipidemia   . Hypertension    Assessment: 63 yo male admitted with chest pain and r/o NSTEMI/ACS. MD wishes to start heparin drip. No anticoagulation noted PTA.    Heparin level therapeutic at 0.38, H/H low but stable, pltc trending down - will monitor.  Goal of Therapy:  Heparin level 0.3-0.7 units/ml Monitor platelets by anticoagulation protocol: Yes   Plan:  -Continue heparin 1500 units/hr -Daily  heparin level and CBC  ADDENDUM: CRRT circuit continually clotting despite systemic heparin. Pt has now received IV heparin for over 8 days for ACS with no immediate plans for cath lab. Discussed with MD, will change systemic heparin to heparin via circuit to provide more localized heparin and possibly mitigate clot risk of circuit.   Samuel Bates, Samuel Bates, Samuel Bates Clinical Pharmacist [phone] Please check AMION for all [facility] numbers 08/06/2018

## 2018-08-06 NOTE — Progress Notes (Signed)
[facility] Progress Note    Assessment/ Plan:    Acute systolic heart failure ejection fraction 15% severe RV dysfunction with cardiogenic shock.  CVPs up  [place] 1/30 w/ high PCW 25.  Still requiring levophed and milrinone, still requires CRRT for now.  Catheter issues remain- will try the tPA as discussed and then intracircut heparin  Coronary artery disease continues aspirin and statin will need cardiac cath prior to discharge- holding for now in case renal function improves  Acute kidney injury crt 1.34 in August 2019 secondary to acute tubular necrosis and hypoperfusion admission creatinine 3.91.  Echogenic right kidney compatible medical renal disease.  Continues on CRRT for now, started on 1/25. No UOP.  Anemia - have  started darbepoetin.  Completed RBC transfusion 07/29/2018  Hypertension/volume.  Norepinephrine with volume removal through CRRT 100 cc/h. No edema; CVP up a little so when we can get restarted may pull about 50cc /hr to catch up.  Anticoagulation IV heparin- systemic gtt  Access right IJ hemodialysis cath placed 1/25, re-wired 2/2  Acute gout L hand, on colchicine   Leukocytosis- climbing on steroids, cultures drawn   VT: on amio and mexilitine  Subjective:    S/p new catheter yesterday, initially ran OK, then this AM with clotting AP high, venous sluggish.  D/w nursing.  Will tPA again, then maybe needs tunneled catheter vs placing fem again (would like to use that as a last resort only).     Objective:   BP 93/65   Pulse 81   Temp 97.8 F (36.6 C) (Oral)   Resp 19   Ht 6\' 3"  (1.905 m)   Wt 75.6 kg   SpO2 92%   BMI 20.83 kg/m   Intake/Output Summary (Last 24 hours) at 08/06/2018 1025 Last data filed at 08/06/2018 1000 Gross per 24 hour  Intake 3040.75 ml  Output 3224 ml  Net -183.25 ml   Weight change: 1.2 kg  Physical Exam: Gen: NAD, lying flat in bed Neck: R nontunneled HD cath in place, L triple lumen in place.    SWN:[id number], no m/r/g Resp: clear anteriorly, muffled at bases Abd: soft, nontender Ext: increasing LE edema  Imaging: No results found.  Labs: BMET Recent Labs  Lab 08/03/18 0508 08/03/18 1606 08/04/18 0408 08/04/18 1544 08/05/18 0249 08/05/18 1618 08/06/18 0435  NA 135 133* 132* 130* 128* 129* 132*  K 4.8 5.4* 5.2* 4.8 5.0 4.7 4.6  CL 100 98 96* 95* 92* 96* 95*  CO2 23 24 22 23  21* 22 23  GLUCOSE 141* 131* 129* 130* 122* 173* 133*  BUN 24* 25* 26* 26* 34* 33* 26*  CREATININE 2.74* 2.70* 2.73* 2.87* 3.82* 3.52* 2.92*  CALCIUM 8.2* 8.2* 8.5* 7.5* 7.9* 7.7* 7.9*  PHOS 4.1 4.1 3.6 3.5 3.5 3.2 2.8   CBC Recent Labs  Lab 08/03/18 0508 08/04/18 0408 08/05/18 0307 08/06/18 0435  WBC 16.3* 17.5* 18.2* 16.2*  HGB 9.3* 9.2* 8.2* 8.6*  HCT 31.5* 31.2* 27.6* 29.3*  MCV 72.1* 72.9* 73.4* 75.1*  PLT 168 164 133* 118*    Medications:    . alteplase  2 mg Intracatheter Once  . aspirin EC  81 mg Oral Daily  . atorvastatin  80 mg Oral q1800  . Chlorhexidine Gluconate Cloth  6 each Topical Daily  . darbepoetin (ARANESP) injection - NON-DIALYSIS  200 mcg Subcutaneous Q Thu-1800  . hydrocortisone sodium succinate  50 mg Intravenous Q12H  . mouth rinse  15 mL Mouth Rinse BID  .  mexiletine  150 mg Oral Q8H  . pantoprazole  40 mg Oral BID  . sodium chloride flush  3 mL Intravenous Q12H      Madelon Lips, MD [facility] pgr [phone] Cell [phone] 08/06/2018, 10:25 AM

## 2018-08-06 NOTE — Progress Notes (Signed)
CRRT changed by night shift RN this morning prior to shift change due to constant "extremely negative access pressure" alarms. This had been an ongoing issue since yesterday that required a CRRT machine change and HD catheter rewiring by MD. After filter was changed this morning, this RN was still unable to get the machine to work and consistently received the same alarms. Initially planned to TPA HD catheter ports but both lines were able to flush and had adequate blood return after heparin dwelled for approx 30 minutes. Filter changed again and still received constant alarms for "extremely negative access pressure." The machine was not able to run at all, it would constantly give alarms. Patient disconnected from machine and both ports heparinized. Dr. Hollie Salk would like to attempt TPA through both ports per IV team before they do a catheter exchange. Will continue to monitor.  Joellen Jersey, RN

## 2018-08-06 NOTE — Progress Notes (Signed)
CRRT machine worked for approx 1.5 hours this afternoon before "extremely negative access pressure" alarms started again. Attempted to reposition in various ways without success. Dr. Hollie Salk notified. New orders received. Will hold off on resuming CRRT until tomorrow.  Joellen Jersey, RN

## 2018-08-06 NOTE — Progress Notes (Signed)
ANTICOAGULATION CONSULT NOTE - [place] for heparin Indication: Chest pain/ACS  Allergies  Allergen Reactions  . Penicillins Hives    Tolerated cefepime 1/20 Did it involve swelling of the face/tongue/throat, SOB, or low BP? YES Did it involve sudden or severe rash/hives, skin peeling, or any reaction on the inside of your mouth or nose? NO Did you need to seek medical attention at a hospital or doctor's office? YES When did it last happen? 30 years ago If all above answers are "NO", may proceed with cephalosporin use.  . Simvastatin Other (See Comments)    insomnia    Patient Measurements: Height: 6\' 3"  (190.5 cm) Weight: 167 lb 8.8 oz (76 kg) IBW/kg (Calculated) : 84.5 Heparin Dosing Weight: 89.8 kg  Vital Signs: Temp: 97.9 F (36.6 C) (02/03 0400) Temp Source: Oral (02/03 0400) BP: 99/71 (02/03 0430) Pulse Rate: 87 (02/03 0430)  Labs: Recent Labs    08/04/18 0408  08/05/18 0249 08/05/18 0307 08/05/18 1618 08/06/18 0435  HGB 9.2*  --   --  8.2*  --  8.6*  HCT 31.2*  --   --  27.6*  --  29.3*  PLT 164  --   --  133*  --  118*  APTT 84*  --  126*  --   --  71*  HEPARINUNFRC 0.37  --  0.57  --   --  0.38  CREATININE 2.73*   < > 3.82*  --  3.52* 2.92*   < > = values in this interval not displayed.    Estimated Creatinine Clearance: 28.2 mL/min (A) (by C-G formula based on SCr of 2.92 mg/dL (H)).   Medical History: Past Medical History:  Diagnosis Date  . Allergy   . Hx of adenomatous colonic polyps 03/07/2017  . Hyperlipidemia   . Hypertension    Assessment: 63 yo male admitted with chest pain and r/o NSTEMI/ACS. MD wishes to start heparin drip. No anticoagulation noted PTA.    Heparin level therapeutic at 0.38, H/H low but stable, pltc trending down - will monitor.  Goal of Therapy:  Heparin level 0.3-0.7 units/ml Monitor platelets by anticoagulation protocol: Yes   Plan:  -Continue heparin 1500 units/hr -Daily  heparin level and CBC  Arrie Senate, PharmD, BCPS Clinical Pharmacist [phone] Please check AMION for all [facility] numbers 08/06/2018

## 2018-08-06 NOTE — Progress Notes (Addendum)
Advanced Heart Failure Rounding Note  PCP-Cardiologist: No primary care provider on file.   Subjective:    NE back up 22 -> 7 -> 40. On milrinone 0.25. SBP now 90-100s, MAPs ~80. Co-ox 56%  On CRRT. HD cath replaced yesterday. Now pulling 50 mlr/hr. Pt reports small amount of dark UOP yesterday. HD circuit had to be replaced this morning. Having a lot of negative pressure alarms today.   Remains IV amio and mexilitene. VT quiescent.   Started on Vanc/Cefepime yesterday with leukocytosis. Blood cx 2/1 NGTD. Afebrile. WBC now improving 18.2 -> 16.2. Steroids decreased yesterday.   Feels tired. Left hand pain improving. Denies SOB. Had an episode of CP vs indigestion that resolved with drinking water earlier this morning.  Echo 07/26/2018 LVEF 15%, with severe RV failure.   RHC 08/02/2018 On milrinone 0.125 and NE 14 RA = 13 RV = 51/18 PA = 50/24 (34) PCW = 25 (v = 35) Fick cardiac output/index = 5.42/2.59 Thermo CO/CI = 5.49/2.63 PVR = 1.6 WU FA sat = 97% PA sat = 54%, 58%  Objective:   Weight Range: 76 kg Body mass index is 20.94 kg/m.   Vital Signs:   Temp:  [97.4 F (36.3 C)-98.9 F (37.2 C)] 97.9 F (36.6 C) (02/03 0400) Pulse Rate:  [62-103] 87 (02/03 0430) Resp:  [10-19] 15 (02/03 0430) BP: (78-102)/(51-74) 99/71 (02/03 0430) SpO2:  [89 %-99 %] 96 % (02/03 0430) Weight:  [76 kg] 76 kg (02/02 0800) Last BM Date: 08/05/18  Weight change: Filed Weights   08/04/18 0600 08/05/18 0500 08/05/18 0800  Weight: 74.5 kg 74.8 kg 76 kg   Intake/Output:   Intake/Output Summary (Last 24 hours) at 08/06/2018 0719 Last data filed at 08/06/2018 0500 Gross per 24 hour  Intake 2743.38 ml  Output 3224 ml  Net -480.62 ml    Physical Exam    General: Lying in bed. No resp difficulty. HEENT: Normal Neck: Supple. JVP 10-11. Right IJ HD cath. Left IJ TLC cath. Carotids 2+ bilat; no bruits. No thyromegaly or nodule noted. Cor: PMI nondisplaced. RRR, +s3 Lungs: CTAB,  normal effort. Abdomen: Soft, non-tender, non-distended, no HSM. No bruits or masses. +BS  Extremities: No cyanosis, clubbing, or rash. R and LLE no edema. Left wrist mildly edematous, tender. Neuro: Alert & orientedx3, cranial nerves grossly intact. moves all 4 extremities w/o difficulty. Affect pleasant  Telemetry   NSR 70-80s. Personally reviewed.   Labs    CBC Recent Labs    08/05/18 0307 08/06/18 0435  WBC 18.2* 16.2*  HGB 8.2* 8.6*  HCT 27.6* 29.3*  MCV 73.4* 75.1*  PLT 133* 025*   Basic Metabolic Panel Recent Labs    08/05/18 0249 08/05/18 1618 08/06/18 0435  NA 128* 129* 132*  K 5.0 4.7 4.6  CL 92* 96* 95*  CO2 21* 22 23  GLUCOSE 122* 173* 133*  BUN 34* 33* 26*  CREATININE 3.82* 3.52* 2.92*  CALCIUM 7.9* 7.7* 7.9*  MG 2.6*  --  2.6*  PHOS 3.5 3.2 2.8   Liver Function Tests Recent Labs    08/04/18 0408  08/05/18 1618 08/06/18 0435  AST 129*  --   --   --   ALT 341*  --   --   --   ALKPHOS 79  --   --   --   BILITOT 2.6*  --   --   --   PROT 6.8  --   --   --  ALBUMIN 3.2*  3.2*   < > 2.9* 2.9*   < > = values in this interval not displayed.   No results for input(s): LIPASE, AMYLASE in the last 72 hours. Cardiac Enzymes No results for input(s): CKTOTAL, CKMB, CKMBINDEX, TROPONINI in the last 72 hours.  BNP: BNP (last 3 results) Recent Labs    07/29/2018 1651  BNP 4,188.4*    ProBNP (last 3 results) No results for input(s): PROBNP in the last 8760 hours.   D-Dimer No results for input(s): DDIMER in the last 72 hours. Hemoglobin A1C No results for input(s): HGBA1C in the last 72 hours. Fasting Lipid Panel No results for input(s): CHOL, HDL, LDLCALC, TRIG, CHOLHDL, LDLDIRECT in the last 72 hours. Thyroid Function Tests No results for input(s): TSH, T4TOTAL, T3FREE, THYROIDAB in the last 72 hours.  Invalid input(s): FREET3  Other results:   Imaging    No results found.   Medications:     Scheduled Medications: . aspirin  EC  81 mg Oral Daily  . atorvastatin  80 mg Oral q1800  . Chlorhexidine Gluconate Cloth  6 each Topical Daily  . darbepoetin (ARANESP) injection - NON-DIALYSIS  200 mcg Subcutaneous Q Thu-1800  . hydrocortisone sodium succinate  50 mg Intravenous Q12H  . mouth rinse  15 mL Mouth Rinse BID  . mexiletine  150 mg Oral Q8H  . pantoprazole  40 mg Oral BID  . sodium chloride flush  3 mL Intravenous Q12H    Infusions: .  prismasol BGK 4/2.5 400 mL/hr at 08/06/18 0551  .  prismasol BGK 4/2.5 200 mL/hr at 08/06/18 0550  . sodium chloride    . sodium chloride Stopped (08/05/18 2206)  . amiodarone 30 mg/hr (08/06/18 0714)  . ceFEPime (MAXIPIME) IV Stopped (08/05/18 2143)  . heparin 1,500 Units/hr (08/06/18 0500)  . milrinone 0.25 mcg/kg/min (08/06/18 0500)  . norepinephrine (LEVOPHED) Adult infusion 40 mcg/min (08/06/18 0500)  . prismasol BGK 4/2.5 2,000 mL/hr at 08/06/18 0551  . vancomycin 1,000 mg (08/05/18 1421)    PRN Medications: Place/Maintain arterial line **AND** sodium chloride, sodium chloride, acetaminophen, heparin, nitroGLYCERIN, ondansetron (ZOFRAN) IV, sodium chloride, sodium chloride flush   Patient Profile   62 y.o.malewith past medical history of hypertension, hyperlipidemia admitted with recent but late presenting inferior lateral myocardial infarction, cardiogenic shock, acute renal failure and microcytic anemia. Echocardiogram showed severe LV and RV dysfunction and moderate mitral regurgitation.  Assessment/Plan   1. Acute systolic HF with cardiogenic shock due to OOH MI - Echo 1/5 EF 15% severe RV dysfunction and moderate MR - Initial co-ox 28% but this was drawn femorally so inaccurate.  - Coox 51% -> 49% on milrinone 0.25 mcg/kg/min and norepi. NE back up to 40 today. SBP 90-100s with MAPs ~80. Coox 56%. Will discuss with Dr Haroldine Laws whether to add another pressor vs decrease SBP goal.  - CVP 10-11 - Volume managed with CRRT. - No ACE/ARB/ARNI or b-blocker  with shock and AKI - Continue to wean stress dose steroids. Per CCM.   2. CAD - s/p OOH in inferolateral MI - Initial trop 28, ten trended down.   - No s/s ischemia.  - Continue ASA and statin No b-blocker with shock - He will need cath prior to discharge but would hold until we see if kidneys recover, or if transitions to HD.   3. AKI/ESRD with uremia - likely due to ATN and hypoperfusion. Initial BUN/CR 198/3.91 - Baseline creatinine normal several months ago - Renal following CRRT started  on 1/25 - Renal u/s 1/26: Mildly echogenic right kidney, compatible with chronic medical renal disease. - He said he had a small amount of dark UOP yesterday.  - Trialysis cath replaced 2/2. Now pulling 50 ms/hr - Continue heparin. Run through circuit   4. Microcytic anemia due to IDA - Hgb 9.2 -> 8.2 -> 8.6 today. MCV 75. - Smear without schistocytes - Has received 2u RBCS 1/26. Repeat transfusion for hgb <= 7.5 - Iron stores low. Has received Feraheme - Will eventually need GI w/u - Continue protonix  5. Shock liver - Improving. Check again tomorrow.   6. VT - Quiescent on amiodarone gtt and PO mexilitene. No further VT. - Keep K > 4.0 Mg > 2.0. K 4.6, mag 2.6 today  7. L wrist pain - Suspect gout. Limited improvement on steroids and colchicine. - Check uric acid.   8. Leukocytosis - likely due to steroids. Started to wean. Cultures NGTD x 24 hours  - Now on Vanc/Cefepime. WBC trending down 16.2 today. Afebrile.   Length of Stay: [id number] NP  08/06/2018, 7:19 AM  Advanced Heart Failure Team Pager [phone] (M-F; 7a - 4p)  Please contact [facility] for night-coverage after hours (4p -7a ) and weekends on [email or web address]  Agree.  He remains on high-dose NE. Co-ox marginal. Remains anuric. Struggling with HD catheter. On broad spectrum abx for presumed sepsis  Lying in bed JVP jaw + trialysis cath Cor RRR Lungs clear Ab soft NT Ext: warm no edema L wrist  improving  He remains critically ill with no evidence of renal recovery to date. BP and co-ox remain marginal despite high-dose pressors. Will add vasopressin for component of septic shock. 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 If not, would consider tunneled cath. Prognosis remains quite concerning. Try to keep SBP > 90 MAP > 70 to maximize renal perfusion/recovery.   CRITICAL CARE Performed by: Glori Bickers  Total critical care time: 35 minutes  Critical care time was exclusive of separately billable procedures and treating other patients.  Critical care was necessary to treat or prevent imminent or life-threatening deterioration.  Critical care was time spent personally by me (independent of midlevel providers or residents) on the following activities: development of treatment plan with patient and/or surrogate as well as nursing, discussions with consultants, evaluation of patient's response to treatment, examination of patient, obtaining history from patient or surrogate, ordering and performing treatments and interventions, ordering and review of laboratory studies, ordering and review of radiographic studies, pulse oximetry and re-evaluation of patient's condition.    Glori Bickers, MD  1:45 PM

## 2018-08-07 ENCOUNTER — Encounter (HOSPITAL_COMMUNITY): Payer: Self-pay | Admitting: Interventional Radiology

## 2018-08-07 ENCOUNTER — Inpatient Hospital Stay (HOSPITAL_COMMUNITY): Payer: Medicaid Other

## 2018-08-07 HISTORY — PX: IR FLUORO GUIDE CV LINE RIGHT: IMG2283

## 2018-08-07 HISTORY — PX: IR US GUIDE VASC ACCESS RIGHT: IMG2390

## 2018-08-07 LAB — RENAL FUNCTION PANEL
ALBUMIN: 2.6 g/dL — AB (ref 3.5–5.0)
Albumin: 2.7 g/dL — ABNORMAL LOW (ref 3.5–5.0)
Albumin: 2.7 g/dL — ABNORMAL LOW (ref 3.5–5.0)
Anion gap: 13 (ref 5–15)
Anion gap: 13 (ref 5–15)
Anion gap: 14 (ref 5–15)
BUN: 39 mg/dL — ABNORMAL HIGH (ref 8–23)
BUN: 39 mg/dL — ABNORMAL HIGH (ref 8–23)
BUN: 46 mg/dL — ABNORMAL HIGH (ref 8–23)
CALCIUM: 7.7 mg/dL — AB (ref 8.9–10.3)
CHLORIDE: 93 mmol/L — AB (ref 98–111)
CO2: 20 mmol/L — ABNORMAL LOW (ref 22–32)
CO2: 21 mmol/L — AB (ref 22–32)
CO2: 21 mmol/L — ABNORMAL LOW (ref 22–32)
Calcium: 7.8 mg/dL — ABNORMAL LOW (ref 8.9–10.3)
Calcium: 7.5 mg/dL — ABNORMAL LOW (ref 8.9–10.3)
Chloride: 95 mmol/L — ABNORMAL LOW (ref 98–111)
Chloride: 95 mmol/L — ABNORMAL LOW (ref 98–111)
Creatinine, Ser: 4.23 mg/dL — ABNORMAL HIGH (ref 0.61–1.24)
Creatinine, Ser: 4.39 mg/dL — ABNORMAL HIGH (ref 0.61–1.24)
Creatinine, Ser: 4.46 mg/dL — ABNORMAL HIGH (ref 0.61–1.24)
GFR calc Af Amer: 15 mL/min — ABNORMAL LOW (ref >60)
GFR calc Af Amer: 16 mL/min — ABNORMAL LOW (ref >60)
GFR calc Af Amer: 16 mL/min — ABNORMAL LOW (ref >60)
GFR calc non Af Amer: 13 mL/min — ABNORMAL LOW (ref >60)
GFR calc non Af Amer: 13 mL/min — ABNORMAL LOW (ref >60)
GFR calc non Af Amer: 14 mL/min — ABNORMAL LOW (ref >60)
GLUCOSE: 124 mg/dL — AB (ref 70–99)
Glucose, Bld: 137 mg/dL — ABNORMAL HIGH (ref 70–99)
Glucose, Bld: 147 mg/dL — ABNORMAL HIGH (ref 70–99)
Phosphorus: 2.6 mg/dL (ref 2.5–4.6)
Phosphorus: 2.8 mg/dL (ref 2.5–4.6)
Phosphorus: 3.2 mg/dL (ref 2.5–4.6)
Potassium: 4.6 mmol/L (ref 3.5–5.1)
Potassium: 4.7 mmol/L (ref 3.5–5.1)
Potassium: 5.1 mmol/L (ref 3.5–5.1)
SODIUM: 128 mmol/L — AB (ref 135–145)
Sodium: 128 mmol/L — ABNORMAL LOW (ref 135–145)
Sodium: 129 mmol/L — ABNORMAL LOW (ref 135–145)

## 2018-08-07 LAB — COOXEMETRY PANEL
Carboxyhemoglobin: 1.7 % — ABNORMAL HIGH (ref 0.5–1.5)
Methemoglobin: 2 % — ABNORMAL HIGH (ref 0.0–1.5)
O2 Saturation: 53.9 %
Total hemoglobin: 8.2 g/dL — ABNORMAL LOW (ref 12.0–16.0)

## 2018-08-07 LAB — CBC
HCT: 25.8 % — ABNORMAL LOW (ref 39.0–52.0)
Hemoglobin: 7.9 g/dL — ABNORMAL LOW (ref 13.0–17.0)
MCH: 22.8 pg — ABNORMAL LOW (ref 26.0–34.0)
MCHC: 30.6 g/dL (ref 30.0–36.0)
MCV: 74.6 fL — ABNORMAL LOW (ref 80.0–100.0)
Platelets: 101 10*3/uL — ABNORMAL LOW (ref 150–400)
RBC: 3.46 MIL/uL — ABNORMAL LOW (ref 4.22–5.81)
RDW: 28.1 % — ABNORMAL HIGH (ref 11.5–15.5)
WBC: 17.3 10*3/uL — ABNORMAL HIGH (ref 4.0–10.5)
nRBC: 27.6 % — ABNORMAL HIGH (ref 0.0–0.2)

## 2018-08-07 LAB — PROTIME-INR
INR: 1.42
Prothrombin Time: 17.2 seconds — ABNORMAL HIGH (ref 11.4–15.2)

## 2018-08-07 LAB — MAGNESIUM: Magnesium: 2.5 mg/dL — ABNORMAL HIGH (ref 1.7–2.4)

## 2018-08-07 LAB — POCT ACTIVATED CLOTTING TIME
ACTIVATED CLOTTING TIME: 153 s
Activated Clotting Time: 142 seconds
Activated Clotting Time: 153 seconds
Activated Clotting Time: 158 seconds
Activated Clotting Time: 153 seconds

## 2018-08-07 LAB — HEPATIC FUNCTION PANEL
ALK PHOS: 78 U/L (ref 38–126)
ALT: 185 U/L — ABNORMAL HIGH (ref 0–44)
AST: 74 U/L — ABNORMAL HIGH (ref 15–41)
Albumin: 2.7 g/dL — ABNORMAL LOW (ref 3.5–5.0)
Bilirubin, Direct: 1 mg/dL — ABNORMAL HIGH (ref 0.0–0.2)
Indirect Bilirubin: 1 mg/dL — ABNORMAL HIGH (ref 0.3–0.9)
Total Bilirubin: 2 mg/dL — ABNORMAL HIGH (ref 0.3–1.2)
Total Protein: 5.8 g/dL — ABNORMAL LOW (ref 6.5–8.1)

## 2018-08-07 LAB — APTT: APTT: 38 s — AB (ref 24–36)

## 2018-08-07 LAB — HEPARIN LEVEL (UNFRACTIONATED): Heparin Unfractionated: <0.1 IU/mL — ABNORMAL LOW (ref 0.30–0.70)

## 2018-08-07 LAB — PREPARE RBC (CROSSMATCH)

## 2018-08-07 MED ORDER — VANCOMYCIN HCL IN DEXTROSE 1-5 GM/200ML-% IV SOLN
INTRAVENOUS | Status: AC
  Administered 2018-08-07: 1000 mg
  Filled 2018-08-07: qty 200

## 2018-08-07 MED ORDER — DEXTROSE 5 % IV SOLN
500.0000 mg | Freq: Once | INTRAVENOUS | Status: AC
  Administered 2018-08-07: 500 mg via INTRAVENOUS
  Filled 2018-08-07: qty 0.5

## 2018-08-07 MED ORDER — MIDAZOLAM HCL 2 MG/2ML IJ SOLN
INTRAMUSCULAR | Status: AC | PRN
  Administered 2018-08-07: 1 mg via INTRAVENOUS
  Administered 2018-08-07: 0.5 mg via INTRAVENOUS

## 2018-08-07 MED ORDER — VANCOMYCIN VARIABLE DOSE PER UNSTABLE RENAL FUNCTION (PHARMACIST DOSING): Status: DC

## 2018-08-07 MED ORDER — MIDAZOLAM HCL 2 MG/2ML IJ SOLN
INTRAMUSCULAR | Status: AC
  Filled 2018-08-07: qty 2

## 2018-08-07 MED ORDER — FENTANYL CITRATE (PF) 100 MCG/2ML IJ SOLN
INTRAMUSCULAR | Status: AC
  Filled 2018-08-07: qty 2

## 2018-08-07 MED ORDER — LIDOCAINE HCL 1 % IJ SOLN
INTRAMUSCULAR | Status: AC
  Filled 2018-08-07: qty 20

## 2018-08-07 MED ORDER — LIDOCAINE HCL 1 % IJ SOLN
INTRAMUSCULAR | Status: AC | PRN
  Administered 2018-08-07: 15 mL

## 2018-08-07 MED ORDER — MIDODRINE HCL 5 MG PO TABS
5.0000 mg | ORAL_TABLET | Freq: Three times a day (TID) | ORAL | Status: DC
  Administered 2018-08-07 – 2018-08-08 (×4): 5 mg via ORAL
  Filled 2018-08-07 (×4): qty 1

## 2018-08-07 MED ORDER — HYDROCORTISONE NA SUCCINATE PF 100 MG IJ SOLR
50.0000 mg | Freq: Every day | INTRAMUSCULAR | Status: AC
  Administered 2018-08-08 – 2018-08-09 (×2): 50 mg via INTRAVENOUS
  Filled 2018-08-07 (×2): qty 2

## 2018-08-07 MED ORDER — FENTANYL CITRATE (PF) 100 MCG/2ML IJ SOLN
INTRAMUSCULAR | Status: AC | PRN
  Administered 2018-08-07: 50 ug via INTRAVENOUS
  Administered 2018-08-07: 25 ug via INTRAVENOUS

## 2018-08-07 MED ORDER — HEPARIN SODIUM (PORCINE) 1000 UNIT/ML IJ SOLN
INTRAMUSCULAR | Status: AC
  Filled 2018-08-07: qty 1

## 2018-08-07 MED ORDER — SODIUM CHLORIDE 0.9% IV SOLUTION: Freq: Once | INTRAVENOUS | Status: DC

## 2018-08-07 NOTE — Procedures (Signed)
Interventional Radiology Procedure Note  Procedure: Placement of a right IJ approach 23cm tip to cuff tunneled HD cath.  Tip is positioned at the superior cavoatrial junction and catheter is ready for immediate use.  Complications: None Recommendations:  - Ok to use - Do not submerge - Routine line care   Signed,  Dulcy Fanny. Earleen Newport, DO

## 2018-08-07 NOTE — Progress Notes (Addendum)
Advanced Heart Failure Rounding Note  PCP-Cardiologist: No primary care provider on file.   Subjective:    NE @ 30. On milrinone 0.25. SBP 90s, MAPs 70s, Co-ox 54%  Lots of issues with HD cath yesterday. Only able to pull off 250 mls in last 24 hours. Renal planning tunneled HD cath today. Received 120 mg IV lasix x1 yesterday with 5 mls UOP. CVP 12-13  Remains on IV amio and mexilitene. No VT.  On empiric Vanc/Cefepime. WBC 16.2 -> 17.3. Blood cx 2/1 NGTD. Afebrile.   Hemoglobin down from 8.6 to 7.9 this morning. Unable to rinse back blood with 3 circuit changes yesterday.   Denies SOB. Had some indigestion that improved with sitting up and taking Protonix. No CP otherwise. Wrist getting better. ROM improved. Uric acid normal.  Echo 07/29/2018 LVEF 15%, with severe RV failure.   RHC 08/02/2018 On milrinone 0.125 and NE 14 RA = 13 RV = 51/18 PA = 50/24 (34) PCW = 25 (v = 35) Fick cardiac output/index = 5.42/2.59 Thermo CO/CI = 5.49/2.63 PVR = 1.6 WU FA sat = 97% PA sat = 54%, 58%  Objective:   Weight Range: 75.6 kg Body mass index is 20.83 kg/m.   Vital Signs:   Temp:  [97.8 F (36.6 C)-98 F (36.7 C)] 98 F (36.7 C) (02/03 1945) Pulse Rate:  [32-92] 79 (02/03 2100) Resp:  [13-23] 20 (02/03 2100) BP: (91-105)/(59-75) 99/70 (02/03 2100) SpO2:  [92 %-98 %] 96 % (02/03 2100) Last BM Date: 08/05/18  Weight change: Filed Weights   08/05/18 0500 08/05/18 0800 08/06/18 0500  Weight: 74.8 kg 76 kg 75.6 kg   Intake/Output:   Intake/Output Summary (Last 24 hours) at 08/07/2018 0701 Last data filed at 08/06/2018 2100 Gross per 24 hour  Intake 1752.67 ml  Output 252 ml  Net 1500.67 ml    Physical Exam    General:Sitting in chair. No resp difficulty. HEENT: Normal Neck: Supple. JVP 12-13. Right IJ HD cath. Left IJ TLC Carotids 2+ bilat; no bruits. No thyromegaly or nodule noted. Cor: PMI nondisplaced. RRR, +s3 Lungs: CTAB, normal effort. Abdomen: Soft,  non-tender, non-distended, no HSM. No bruits or masses. +BS  Extremities: No cyanosis, clubbing, or rash. R and LLE no edema. Left wrist mildly edematous Neuro: Alert & orientedx3, cranial nerves grossly intact. moves all 4 extremities w/o difficulty. Affect pleasant  Telemetry   NSR 80s. Personally reviewed.   Labs    CBC Recent Labs    08/06/18 0435 08/07/18 0458  WBC 16.2* 17.3*  HGB 8.6* 7.9*  HCT 29.3* 25.8*  MCV 75.1* 74.6*  PLT 118* 425*   Basic Metabolic Panel Recent Labs    08/06/18 0435  08/06/18 2339 08/07/18 0422  NA 132*   < > 128* 128*  K 4.6   < > 4.7 4.6  CL 95*   < > 93* 95*  CO2 23   < > 21* 20*  GLUCOSE 133*   < > 137* 124*  BUN 26*   < > 39* 46*  CREATININE 2.92*   < > 4.46* 4.23*  CALCIUM 7.9*   < > 7.7* 7.8*  MG 2.6*  --   --  2.5*  PHOS 2.8   < > 2.6 2.8   < > = values in this interval not displayed.   Liver Function Tests Recent Labs    08/06/18 2339 08/07/18 0422  AST  --  74*  ALT  --  185*  ALKPHOS  --  78  BILITOT  --  2.0*  PROT  --  5.8*  ALBUMIN 2.7* 2.7*  2.6*   No results for input(s): LIPASE, AMYLASE in the last 72 hours. Cardiac Enzymes No results for input(s): CKTOTAL, CKMB, CKMBINDEX, TROPONINI in the last 72 hours.  BNP: BNP (last 3 results) Recent Labs    07/05/2018 1651  BNP 4,188.4*    ProBNP (last 3 results) No results for input(s): PROBNP in the last 8760 hours.   D-Dimer No results for input(s): DDIMER in the last 72 hours. Hemoglobin A1C No results for input(s): HGBA1C in the last 72 hours. Fasting Lipid Panel No results for input(s): CHOL, HDL, LDLCALC, TRIG, CHOLHDL, LDLDIRECT in the last 72 hours. Thyroid Function Tests No results for input(s): TSH, T4TOTAL, T3FREE, THYROIDAB in the last 72 hours.  Invalid input(s): FREET3  Other results:   Imaging    No results found.   Medications:     Scheduled Medications: . aspirin EC  81 mg Oral Daily  . atorvastatin  80 mg Oral q1800  .  Chlorhexidine Gluconate Cloth  6 each Topical Daily  . darbepoetin (ARANESP) injection - NON-DIALYSIS  200 mcg Subcutaneous Q Thu-1800  . hydrocortisone sodium succinate  50 mg Intravenous Q12H  . mouth rinse  15 mL Mouth Rinse BID  . mexiletine  150 mg Oral Q8H  . pantoprazole  40 mg Oral BID  . sodium chloride flush  3 mL Intravenous Q12H    Infusions: .  prismasol BGK 4/2.5 400 mL/hr at 08/06/18 0551  .  prismasol BGK 4/2.5 200 mL/hr at 08/06/18 0550  . sodium chloride    . sodium chloride 10 mL/hr at 08/06/18 2100  . amiodarone 30 mg/hr (08/06/18 2100)  . ceFEPime (MAXIPIME) IV 2 g (08/07/18 0004)  . heparin 10,000 units/ 20 mL infusion syringe 450 Units/hr (08/06/18 1632)  . milrinone 0.25 mcg/kg/min (08/06/18 2100)  . norepinephrine (LEVOPHED) Adult infusion 30 mcg/min (08/07/18 0318)  . prismasol BGK 4/2.5 2,000 mL/hr at 08/06/18 1626  . vancomycin    . vasopressin (PITRESSIN) infusion - *FOR SHOCK*      PRN Medications: Place/Maintain arterial line **AND** sodium chloride, sodium chloride, acetaminophen, heparin, heparin, nitroGLYCERIN, ondansetron (ZOFRAN) IV, sodium chloride, sodium chloride flush   Patient Profile   62 y.o.malewith past medical history of hypertension, hyperlipidemia admitted with recent but late presenting inferior lateral myocardial infarction, cardiogenic shock, acute renal failure and microcytic anemia. Echocardiogram showed severe LV and RV dysfunction and moderate mitral regurgitation.  Assessment/Plan   1. Acute systolic HF with cardiogenic shock due to OOH MI - Echo 1/5 EF 15% severe RV dysfunction and moderate MR - Initial co-ox 28% but this was drawn femorally so inaccurate.  - Coox 54% on milrinone 0.25 mcg/kg/min and norepi. NE @ 30. SBP 90s. - CVP 12-13 - Volume managed with CRRT. Going for tunneled cath today.  - No ACE/ARB/ARNI or b-blocker with shock and AKI - Continue to wean stress dose steroids. Per CCM.   2. CAD - s/p OOH  in inferolateral MI - Initial trop 28, ten trended down.   - No s/s ischemia.  - Continue ASA and statin No b-blocker with shock - He will need cath prior to discharge but would hold until we see if kidneys recover, or if transitions to HD.   3. AKI/ESRD with uremia - likely due to ATN and hypoperfusion. Initial BUN/CR 198/3.91 - Baseline creatinine normal several months ago - Renal following CRRT started on 1/25 -  Renal u/s 1/26: Mildly echogenic right kidney, compatible with chronic medical renal disease. - Trialysis cath replaced 2/2. Had lots of issues yesterday with HD cath. Only pulled 250 mls in 24 hours. No improvement with TPA-ing line. Renal planning tunneled HD cath. - Received 120 mg IV lasix x1 yesterday with only 5 mls UOP.  4. Microcytic anemia due to IDA - Hgb 9.2 -> 8.2 -> 8.6 -> 7.9. Likely from CRRT circuit blood loss. Unable to rinse back x3. Give 2 units PRBCs - Smear without schistocytes - Has received 2u RBCS 1/26. Repeat transfusion for hgb <= 7.5 - Iron stores low. Has received Feraheme - Will eventually need GI w/u - Continue protonix  5. Shock liver - Improving.  6. VT - Quiescent on amiodarone gtt and PO mexilitene. No further VT. - Keep K > 4.0 Mg > 2.0. K 4.6, mag 2.5  7. L wrist pain - Suspect gout. Limited improvement on steroids and colchicine. - Uric acid normal at 3.2  8. Leukocytosis - likely due to steroids. Started to wean. Cultures NGTD x 24 hours  - Now on Vanc/Cefepime. WBC trending back up 17.3. Afebrile.  9. Hyponatremia - Na 128. Restrict free water  Addendum: Give 2 units PRBCs.  Length of Stay: [place], NP  08/07/2018, 7:01 AM  Advanced Heart Failure Team Pager [phone] (M-F; 7a - 4p)  Please contact [facility] for night-coverage after hours (4p -7a ) and weekends on [email or web address]  Agree with above.   Remains on dual pressors with marginal cardiac output. Continues to struggle with non-functioning  trialysis cath. CVP 12. Rhythm stable. Has progressive anemia. Left wrist pain improving with steroids.   JVP 12 Cor RRR  Lungs clear Ab soft nt Ext trace edema  Remains critically ill requiring dual pressors. Remains anuric despite high-dose lasix challenge. Dialysis cath not working. D/w Renal - will place tunneled cath though I do worry that he may have anatomical issue that is making catheter malfunction. Will transfuse 2uRBCs. Continue waiting on renal function but will need heart cath at some point if possible.   CRITICAL CARE Performed by: Glori Bickers  Total critical care time: 35 minutes  Critical care time was exclusive of separately billable procedures and treating other patients.  Critical care was necessary to treat or prevent imminent or life-threatening deterioration.  Critical care was time spent personally by me (independent of midlevel providers or residents) on the following activities: development of treatment plan with patient and/or surrogate as well as nursing, discussions with consultants, evaluation of patient's response to treatment, examination of patient, obtaining history from patient or surrogate, ordering and performing treatments and interventions, ordering and review of laboratory studies, ordering and review of radiographic studies, pulse oximetry and re-evaluation of patient's condition.  Glori Bickers, MD  11:51 AM

## 2018-08-07 NOTE — Progress Notes (Addendum)
Pharmacy Antibiotic Note  Samuel Bates is a 63 y.o. male admitted on 07/27/2018 with cardiogenic shock s/p MI at [facility].  Currently on milrinone and norepinephrine for cardiac output and BP support and CRT for AKI .  Pharmacy has been consulted to restart vancomycin and cefepime dosing in setting of in wbc central lines and possible infection.  CRRT was held yesterday 2/3 due to issues with machine, restarted briefly and then malfunctioned again. Pt to receive additional line placement today per IR (thought to be cause of machine malfunction). Pt is essentially anuric despite Lasix challenge yesterday. Will give reduced dose of cefepime tonight, and check vancomycin random tomorrow morning prior to redosing. Once CRRT resumed, will adjust dose of cefepime to 2g IV q12h.  Plan: -Cefepime 500mg  IV x1 tonight -Will adjust to cefepime 2g IV q12h once CRRT initiated again (likely tomorrow) -Vancomycin random with morning labs to assess clearance  ADDENDUM: Pt was given vancomycin 1000mg  IV x1 at 1300 prior to PICC placement for unknown reasons. Will continue to hold vancomycin and retime vancomycin random level.   Height: 6\' 3"  (190.5 cm) Weight: 171 lb 8.3 oz (77.8 kg) IBW/kg (Calculated) : 84.5  Temp (24hrs), Avg:98.1 F (36.7 C), Min:98 F (36.7 C), Max:98.2 F (36.8 C)  Recent Labs  Lab 08/03/18 0508  08/04/18 0408  08/05/18 0307 08/05/18 1618 08/06/18 0435 08/06/18 1602 08/06/18 2339 08/07/18 0422 08/07/18 0458  WBC 16.3*  --  17.5*  --  18.2*  --  16.2*  --   --   --  17.3*  CREATININE 2.74*   < > 2.73*   < >  --  3.52* 2.92* 3.82* 4.46* 4.23*  --    < > = values in this interval not displayed.    Estimated Creatinine Clearance: 19.9 mL/min (A) (by C-G formula based on SCr of 4.23 mg/dL (H)).    Allergies  Allergen Reactions  . Penicillins Hives    Tolerated cefepime 1/20 Did it involve swelling of the face/tongue/throat, SOB, or low BP? YES Did it involve sudden or severe  rash/hives, skin peeling, or any reaction on the inside of your mouth or nose? NO Did you need to seek medical attention at a hospital or doctor's office? YES When did it last happen? 30 years ago If all above answers are "NO", may proceed with cephalosporin use.  . Simvastatin Other (See Comments)    insomnia    Antimicrobials: Vancomycin 1/25 >>1/26; 2/2 >> Cefepime 1/25 x1; 2/2 >>  Microbiology: 1/25 BCx: neg 2/1 BCx: ngtd   Arrie Senate, PharmD, BCPS Clinical Pharmacist [phone] Please check AMION for all [facility] numbers 08/07/2018

## 2018-08-07 NOTE — Progress Notes (Signed)
[facility] Progress Note    Assessment/ Plan:    Acute systolic heart failure ejection fraction 15% severe RV dysfunction with cardiogenic shock.  CVPs up  [place] 1/30 w/ high PCW 25.  Still requiring levophed and milrinone, still requires CRRT for now.  For tunneled HD cath today, appreciate IR.  Will run net neg with intracircuit heparin when access obtained.  Coronary artery disease continues aspirin and statin will need cardiac cath prior to discharge- holding for now in case renal function improves  Acute kidney injury crt 1.34 in August 2019 secondary to acute tubular necrosis and hypoperfusion admission creatinine 3.91.  Echogenic right kidney compatible medical renal disease.  Continues on CRRT for now, started on 1/25. No UOP.  Anemia - have  started darbepoetin.  Completed RBC transfusion 07/29/2018  Hypertension/volume.  Norepinephrine with volume removal through CRRT 100 cc/h when able  Anticoagulation IV heparin- systemic gtt  Access right IJ hemodialysis cath placed 1/25, re-wired 2/2  Acute gout L hand, on colchicine   Leukocytosis- climbing on steroids, cultures drawn and neg, on steroids   VT: on amio and mexilitine  Subjective:    For tunneled HD today.  Still on pressor.  Sitting in chair this AM   Objective:   BP 98/71   Pulse 82   Temp 98 F (36.7 C) (Oral)   Resp 15   Ht 6\' 3"  (1.905 m)   Wt 77.8 kg   SpO2 98%   BMI 21.44 kg/m   Intake/Output Summary (Last 24 hours) at 08/07/2018 0951 Last data filed at 08/07/2018 0800 Gross per 24 hour  Intake 2083.19 ml  Output 257 ml  Net 1826.19 ml   Weight change: 1.8 kg  Physical Exam: Gen: NAD, lying flat in bed Neck: R nontunneled HD cath in place, L triple lumen in place.   INO:[id number], no m/r/g Resp: clear anteriorly, muffled at bases Abd: soft, nontender Ext: increasing LE edema  Imaging: No results found.  Labs: BMET Recent Labs  Lab 08/04/18 1544 08/05/18 0249  08/05/18 1618 08/06/18 0435 08/06/18 1602 08/06/18 2339 08/07/18 0422  NA 130* 128* 129* 132* 130* 128* 128*  K 4.8 5.0 4.7 4.6 4.6 4.7 4.6  CL 95* 92* 96* 95* 97* 93* 95*  CO2 23 21* 22 23 22  21* 20*  GLUCOSE 130* 122* 173* 133* 147* 137* 124*  BUN 26* 34* 33* 26* 37* 39* 46*  CREATININE 2.87* 3.82* 3.52* 2.92* 3.82* 4.46* 4.23*  CALCIUM 7.5* 7.9* 7.7* 7.9* 7.6* 7.7* 7.8*  PHOS 3.5 3.5 3.2 2.8 2.8 2.6 2.8   CBC Recent Labs  Lab 08/04/18 0408 08/05/18 0307 08/06/18 0435 08/07/18 0458  WBC 17.5* 18.2* 16.2* 17.3*  HGB 9.2* 8.2* 8.6* 7.9*  HCT 31.2* 27.6* 29.3* 25.8*  MCV 72.9* 73.4* 75.1* 74.6*  PLT 164 133* 118* 101*    Medications:    . sodium chloride   Intravenous Once  . aspirin EC  81 mg Oral Daily  . atorvastatin  80 mg Oral q1800  . Chlorhexidine Gluconate Cloth  6 each Topical Daily  . darbepoetin (ARANESP) injection - NON-DIALYSIS  200 mcg Subcutaneous Q Thu-1800  . hydrocortisone sodium succinate  50 mg Intravenous Q12H  . [START ON 08/08/2018] hydrocortisone sod succinate (SOLU-CORTEF) inj  50 mg Intravenous Daily  . mouth rinse  15 mL Mouth Rinse BID  . mexiletine  150 mg Oral Q8H  . midodrine  5 mg Oral TID WC  . pantoprazole  40 mg Oral  BID  . sodium chloride flush  3 mL Intravenous Q12H  . vancomycin variable dose per unstable renal function (pharmacist dosing)   Does not apply See admin instructions      Madelon Lips, MD [facility] pgr [phone] Cell [phone] 08/07/2018, 9:51 AM

## 2018-08-07 NOTE — Consult Note (Signed)
Chief Complaint: Patient was seen in consultation today for tunneled dialysis catheter placement Chief Complaint  Patient presents with  . Shortness of Breath   at the request of Dr Laurena Bering   Supervising Physician: Corrie Mckusick  Patient Status: [facility] - In-pt  History of Present Illness: Samuel Bates is a 63 y.o. male   HTN; HLD Recent MI; cardiogenic shock Acute renal failure; anemia No UOP Hypotension  Volume managed with CRRT Temp trialysis cath in right IJ 1/25 and again 2/2 This catheter was placed as a replacement of nonfunctioning Trialysis catheter per Dr Haroldine Laws 08/05/18-- still not working well  Dr Hollie Salk requesting tunneled dialysis catheter placement Will need ongoing dialysis per Nephrology    Past Medical History:  Diagnosis Date  . Allergy   . Hx of adenomatous colonic polyps 03/07/2017  . Hyperlipidemia   . Hypertension     Past Surgical History:  Procedure Laterality Date  . COLONOSCOPY    . DIALYSIS/PERMA CATHETER INSERTION N/A 07/31/2018   Procedure: DIALYSIS/PERMA CATHETER INSERTION;  Surgeon: Jolaine Artist, MD;  Location: [place] [facility];  Service: Cardiovascular;  Laterality: N/A;  . POLYPECTOMY    . RIGHT HEART CATH N/A 07/21/2018   Procedure: RIGHT HEART CATH;  Surgeon: Jolaine Artist, MD;  Location: [place] [facility];  Service: Cardiovascular;  Laterality: N/A;  . TONSILLECTOMY     age 47    Allergies: Penicillins and Simvastatin  Medications: Prior to Admission medications   Medication Sig Start Date End Date Taking? Authorizing Provider  amLODipine (NORVASC) 10 MG tablet Take 10 mg by mouth daily.   Yes [provider]  atorvastatin (LIPITOR) 40 MG tablet Take 40 mg by mouth daily.   Yes [provider]  Cetirizine HCl 10 MG CAPS Take 1 capsule by mouth as needed (allergies).    Yes [provider]  hydrochlorothiazide (HYDRODIURIL) 25 MG tablet Take 25 mg by mouth daily.   Yes [provider]  losartan (COZAAR) 50 MG tablet Take 50 mg by mouth daily.   Yes [provider]  omega-3 fish oil (MAXEPA) 1000 MG CAPS capsule Take 2 capsules by mouth daily.   Yes [provider]     Family History  Problem Relation Age of Onset  . Colon polyps Brother   . Stroke Brother   . COPD Father   . COPD Sister   . Colon cancer Neg Hx   . Esophageal cancer Neg Hx   . Rectal cancer Neg Hx   . Stomach cancer Neg Hx   . Diabetes Neg Hx   . Colitis Neg Hx     Social History   Socioeconomic History  . Marital status: Single    Spouse name: Not on file  . Number of children: 3  . Years of education: Not on file  . Highest education level: Not on file  Occupational History  . Not on file  Social Needs  . Financial resource strain: Not on file  . Food insecurity:    Worry: Not on file    Inability: Not on file  . Transportation needs:    Medical: Not on file    Non-medical: Not on file  Tobacco Use  . Smoking status: Current Every Day Smoker    Types: Cigars  . Smokeless tobacco: Never Used  . Tobacco comment: has cut back not daily  Substance and Sexual Activity  . Alcohol use: Yes    Alcohol/week: 2.0 standard drinks  Types: 2 Cans of beer per week    Comment: 2-3 beers per day  . Drug use: Yes    Types: Marijuana    Comment: had some Saturday 04/07/2018  . Sexual activity: Not on file  Lifestyle  . Physical activity:    Days per week: Not on file    Minutes per session: Not on file  . Stress: Not on file  Relationships  . Social connections:    Talks on phone: Not on file    Gets together: Not on file    Attends religious service: Not on file    Active member of club or organization: Not on file    Attends meetings of clubs or organizations: Not on file    Relationship status: Not on file  Other Topics Concern  . Not on file  Social History Narrative  . Not on file    Review of Systems: A 12 point ROS discussed and  pertinent positives are indicated in the HPI above.  All other systems are negative.  Review of Systems  Constitutional: Positive for activity change, appetite change and fatigue. Negative for fever.  Respiratory: Positive for shortness of breath. Negative for cough.   Cardiovascular: Negative for chest pain.  Gastrointestinal: Negative for abdominal pain.  Genitourinary: Positive for decreased urine volume.  Neurological: Positive for weakness.  Psychiatric/Behavioral: Negative for behavioral problems and confusion.    Vital Signs: BP 96/66   Pulse 83   Temp 98 F (36.7 C) (Oral)   Resp (!) 21   Ht 6\' 3"  (1.905 m)   Wt 171 lb 8.3 oz (77.8 kg)   SpO2 98%   BMI 21.44 kg/m   Physical Exam Vitals signs reviewed.  Cardiovascular:     Rate and Rhythm: Normal rate and regular rhythm.  Pulmonary:     Effort: Pulmonary effort is normal.  Abdominal:     General: Bowel sounds are normal.     Palpations: Abdomen is soft.  Musculoskeletal: Normal range of motion.  Skin:    General: Skin is warm and dry.  Neurological:     General: No focal deficit present.     Mental Status: He is alert.  Psychiatric:        Mood and Affect: Mood normal.        Behavior: Behavior normal.     Imaging: Dg Chest 2 View  Result Date: 07/25/2018 CLINICAL DATA:  Shortness of breath for the past week with intermittent chest pain. EXAM: CHEST - 2 VIEW COMPARISON:  05/10/2006. FINDINGS: Stable enlarged cardiac silhouette. Interval band like area of patchy opacity in the right mid lung zone. Clear left lung. Stable mild diffuse prominence of the interstitial markings with normal vascularity. Decreased peribronchial thickening. Mild thoracolumbar spine degenerative changes. IMPRESSION: 1. Interval band like area of patchy opacity in the right mid lung zone. This could represent pneumonia, patchy atelectasis or small area of pulmonary infarction. 2. Stable cardiomegaly and mild chronic interstitial lung  disease. Electronically Signed   By: Claudie Revering M.D.   On: 07/25/2018 14:36   US Renal  Result Date: 07/29/2018 CLINICAL DATA:  Acute kidney injury EXAM: RENAL / URINARY TRACT ULTRASOUND COMPLETE COMPARISON:  None. FINDINGS: Right Kidney: Renal measurements: 11.1 cm. Echogenicity mildly increased. No mass or hydronephrosis visualized. Upper pole cyst measures 1.6 x 1.3 x 1.5 cm. Left Kidney: Not visualized due to inability of patient to be positioned adequately. Bladder: Decompressed by Foley catheter. IMPRESSION: 1. Mildly echogenic right kidney, compatible  with chronic medical renal disease. 2. Nonvisualization of the left kidney. Electronically Signed   By: Ulyses Jarred M.D.   On: 07/29/2018 03:55   Dg Chest Port 1 View  Result Date: 08/03/2018 CLINICAL DATA:  Hypoxia. EXAM: PORTABLE CHEST 1 VIEW COMPARISON:  07/29/2018. FINDINGS: Interim placement right IJ line, its tip is over the superior vena cava. Left IJ line noted with tip over the right innominate vein. Mediastinum unremarkable. Stable cardiomegaly. No focal infiltrate. No pleural effusion or pneumothorax. Left costophrenic angle not completely imaged. IMPRESSION: 1. Interim placement right IJ line, its tip is over the superior vena cava. Left IJ line noted, tip is now over the right innominate vein. 2. Severe cardiomegaly. No pulmonary venous congestion. No focal infiltrate. Electronically Signed   By: Marcello Moores  Register   On: 08/03/2018 11:09   Dg Chest Port 1 View  Result Date: 07/29/2018 CLINICAL DATA:  Encounter for central line placement. EXAM: PORTABLE CHEST 1 VIEW COMPARISON:  07/29/2018 FINDINGS: The there is been interval placement of a left IJ catheter with tip projecting over the SVC. No pneumothorax identified. Cardiac enlargement is stable. No pleural effusion or edema identified. No airspace opacities. IMPRESSION: 1. No complications after left IJ catheter placement. The catheter tip projects over the SVC. 2. Cardiac  enlargement. Electronically Signed   By: Kerby Moors M.D.   On: 07/29/2018 13:27   Dg Chest Port 1 View  Result Date: 07/29/2018 CLINICAL DATA:  Respiratory failure EXAM: PORTABLE CHEST 1 VIEW COMPARISON:  Yesterday FINDINGS: Cardiomegaly. Mild atelectasis or scarring along the right minor fissure. Interstitial coarsening without Kerley lines or effusion. No pneumothorax. Aortic tortuosity. IMPRESSION: Cardiomegaly and vascular congestion. Electronically Signed   By: Monte Fantasia M.D.   On: 07/29/2018 06:53    Labs:  CBC: Recent Labs    08/04/18 0408 08/05/18 0307 08/06/18 0435 08/07/18 0458  WBC 17.5* 18.2* 16.2* 17.3*  HGB 9.2* 8.2* 8.6* 7.9*  HCT 31.2* 27.6* 29.3* 25.8*  PLT 164 133* 118* 101*    COAGS: Recent Labs    07/06/2018 1651  08/04/18 0408 08/05/18 0249 08/06/18 0435 08/07/18 0422 08/07/18 0705  INR 1.81  1.83  --   --   --   --   --  1.42  APTT 32   < > 84* 126* 71* 38*  --    < > = values in this interval not displayed.    BMP: Recent Labs    08/06/18 0435 08/06/18 1602 08/06/18 2339 08/07/18 0422  NA 132* 130* 128* 128*  K 4.6 4.6 4.7 4.6  CL 95* 97* 93* 95*  CO2 23 22 21* 20*  GLUCOSE 133* 147* 137* 124*  BUN 26* 37* 39* 46*  CALCIUM 7.9* 7.6* 7.7* 7.8*  CREATININE 2.92* 3.82* 4.46* 4.23*  GFRNONAA 22* 16* 13* 14*  GFRAA 25* 18* 15* 16*    LIVER FUNCTION TESTS: Recent Labs    08/02/18 0359  08/03/18 0508  08/04/18 0408  08/06/18 0435 08/06/18 1602 08/06/18 2339 08/07/18 0422  BILITOT 2.3*  --  2.4*  --  2.6*  --   --   --   --  2.0*  AST 140*  --  158*  --  129*  --   --   --   --  74*  ALT 444*  --  416*  --  341*  --   --   --   --  185*  ALKPHOS 85  --  85  --  79  --   --   --   --  78  PROT 6.6  --  7.0  --  6.8  --   --   --   --  5.8*  ALBUMIN 3.1*  3.2*   < > 3.3*  3.1*   < > 3.2*  3.2*   < > 2.9* 2.7* 2.7* 2.7*  2.6*   < > = values in this interval not displayed.    TUMOR MARKERS: No results for input(s):  AFPTM, CEA, CA199, CHROMGRNA in the last 8760 hours.  Assessment and Plan:  ARF- no UOP CHF; CAD/MI; anemia Malfunctioning trialysis catheter x 2 Need for longer term dialysis Scheduled for tunneled dialysis catheter placement Risks and benefits discussed with the patient including, but not limited to bleeding, infection, vascular injury, pneumothorax which may require chest tube placement, air embolism or even death  All of the patient's questions were answered, patient is agreeable to proceed. Consent signed and in chart.  Thank you for this interesting consult.  I greatly enjoyed meeting Samuel Bates and look forward to participating in their care.  A copy of this report was sent to the requesting provider on this date.  Electronically Signed: Lavonia Drafts, PA-C 08/07/2018, 8:59 AM   I spent a total of 20 Minutes    in face to face in clinical consultation, greater than 50% of which was counseling/coordinating care for tunneled dialysis catheter placement

## 2018-08-07 NOTE — Progress Notes (Signed)
Late Entry  This RN traveled to IR with patient for [facility] placement. Patient remained stable throughout procedure. Upon return to [facility], CRRT was immediately set up and restarted on patient. Blood transfusion started as ordered by Lillia Mountain, NP once CRRT restarted. Patient remains A&Ox4. Will continue to monitor patient closely.  Joellen Jersey, RN

## 2018-08-08 LAB — TYPE AND SCREEN
ABO/RH(D): B POS
Antibody Screen: NEGATIVE
UNIT DIVISION: 0
Unit division: 0

## 2018-08-08 LAB — CBC
HCT: 30.8 % — ABNORMAL LOW (ref 39.0–52.0)
Hemoglobin: 9.3 g/dL — ABNORMAL LOW (ref 13.0–17.0)
MCH: 22.9 pg — ABNORMAL LOW (ref 26.0–34.0)
MCHC: 30.2 g/dL (ref 30.0–36.0)
MCV: 75.9 fL — ABNORMAL LOW (ref 80.0–100.0)
Platelets: 90 10*3/uL — ABNORMAL LOW (ref 150–400)
RBC: 4.06 MIL/uL — ABNORMAL LOW (ref 4.22–5.81)
RDW: 28.5 % — ABNORMAL HIGH (ref 11.5–15.5)
WBC: 13.1 10*3/uL — ABNORMAL HIGH (ref 4.0–10.5)
nRBC: 26.9 % — ABNORMAL HIGH (ref 0.0–0.2)

## 2018-08-08 LAB — BPAM RBC
Blood Product Expiration Date: 202002252359
Blood Product Expiration Date: 202002252359
ISSUE DATE / TIME: 202002041818
ISSUE DATE / TIME: 202002041520
Unit Type and Rh: 7300
Unit Type and Rh: 7300

## 2018-08-08 LAB — POCT ACTIVATED CLOTTING TIME
ACTIVATED CLOTTING TIME: 164 s
ACTIVATED CLOTTING TIME: 180 s
Activated Clotting Time: 153 seconds
Activated Clotting Time: 153 seconds
Activated Clotting Time: 158 seconds
Activated Clotting Time: 164 seconds
Activated Clotting Time: 164 seconds
Activated Clotting Time: 175 seconds
Activated Clotting Time: 180 seconds
Activated Clotting Time: 191 seconds
Activated Clotting Time: 191 seconds
Activated Clotting Time: 191 seconds
Activated Clotting Time: 197 seconds
Activated Clotting Time: 208 seconds

## 2018-08-08 LAB — COOXEMETRY PANEL
Carboxyhemoglobin: 1.7 % — ABNORMAL HIGH (ref 0.5–1.5)
Methemoglobin: 1.9 % — ABNORMAL HIGH (ref 0.0–1.5)
O2 Saturation: 57 %
TOTAL HEMOGLOBIN: 9.8 g/dL — AB (ref 12.0–16.0)

## 2018-08-08 LAB — RENAL FUNCTION PANEL
ANION GAP: 9 (ref 5–15)
Albumin: 2.7 g/dL — ABNORMAL LOW (ref 3.5–5.0)
Albumin: 2.6 g/dL — ABNORMAL LOW (ref 3.5–5.0)
Anion gap: 13 (ref 5–15)
BUN: 32 mg/dL — ABNORMAL HIGH (ref 8–23)
BUN: 26 mg/dL — ABNORMAL HIGH (ref 8–23)
CHLORIDE: 97 mmol/L — AB (ref 98–111)
CO2: 25 mmol/L (ref 22–32)
CO2: 22 mmol/L (ref 22–32)
Calcium: 7.9 mg/dL — ABNORMAL LOW (ref 8.9–10.3)
Calcium: 7.8 mg/dL — ABNORMAL LOW (ref 8.9–10.3)
Chloride: 98 mmol/L (ref 98–111)
Creatinine, Ser: 3.95 mg/dL — ABNORMAL HIGH (ref 0.61–1.24)
Creatinine, Ser: 3.16 mg/dL — ABNORMAL HIGH (ref 0.61–1.24)
GFR calc Af Amer: 18 mL/min — ABNORMAL LOW (ref >60)
GFR calc Af Amer: 23 mL/min — ABNORMAL LOW (ref >60)
GFR calc non Af Amer: 15 mL/min — ABNORMAL LOW (ref >60)
GFR, EST NON AFRICAN AMERICAN: 20 mL/min — AB (ref >60)
Glucose, Bld: 120 mg/dL — ABNORMAL HIGH (ref 70–99)
Glucose, Bld: 131 mg/dL — ABNORMAL HIGH (ref 70–99)
Phosphorus: 3 mg/dL (ref 2.5–4.6)
Phosphorus: 2.5 mg/dL (ref 2.5–4.6)
Potassium: 4.5 mmol/L (ref 3.5–5.1)
Potassium: 4.7 mmol/L (ref 3.5–5.1)
Sodium: 132 mmol/L — ABNORMAL LOW (ref 135–145)
Sodium: 132 mmol/L — ABNORMAL LOW (ref 135–145)

## 2018-08-08 LAB — HEPARIN LEVEL (UNFRACTIONATED): Heparin Unfractionated: <0.1 IU/mL — ABNORMAL LOW (ref 0.30–0.70)

## 2018-08-08 LAB — MAGNESIUM: Magnesium: 2.5 mg/dL — ABNORMAL HIGH (ref 1.7–2.4)

## 2018-08-08 LAB — APTT: aPTT: 43 seconds — ABNORMAL HIGH (ref 24–36)

## 2018-08-08 LAB — VANCOMYCIN, RANDOM: VANCOMYCIN RM: 10

## 2018-08-08 MED ORDER — VANCOMYCIN HCL IN DEXTROSE 750-5 MG/150ML-% IV SOLN
750.0000 mg | INTRAVENOUS | Status: DC
  Administered 2018-08-08 – 2018-08-09 (×2): 750 mg via INTRAVENOUS
  Filled 2018-08-08 (×2): qty 150

## 2018-08-08 MED ORDER — SODIUM CHLORIDE 0.9 % IV SOLN
2.0000 g | Freq: Two times a day (BID) | INTRAVENOUS | Status: DC
  Administered 2018-08-08 – 2018-08-10 (×6): 2 g via INTRAVENOUS
  Filled 2018-08-08 (×7): qty 2

## 2018-08-08 MED ORDER — MIDODRINE HCL 5 MG PO TABS
10.0000 mg | ORAL_TABLET | Freq: Three times a day (TID) | ORAL | Status: DC
  Administered 2018-08-08 – 2018-08-13 (×16): 10 mg via ORAL
  Filled 2018-08-08 (×16): qty 2

## 2018-08-08 MED ORDER — CHLORHEXIDINE GLUCONATE 0.12 % MT SOLN
15.0000 mL | Freq: Two times a day (BID) | OROMUCOSAL | Status: DC
  Administered 2018-08-08 – 2018-08-19 (×21): 15 mL via OROMUCOSAL
  Filled 2018-08-08 (×18): qty 15

## 2018-08-08 MED ORDER — ORAL CARE MOUTH RINSE: 15.0000 mL | Freq: Two times a day (BID) | OROMUCOSAL | Status: DC

## 2018-08-08 MED ORDER — NOREPINEPHRINE 16 MG/250ML-% IV SOLN
0.0000 ug/min | INTRAVENOUS | Status: DC
  Administered 2018-08-08: 16 ug/min via INTRAVENOUS
  Administered 2018-08-09: 15 ug/min via INTRAVENOUS
  Administered 2018-08-10: 20 ug/min via INTRAVENOUS
  Administered 2018-08-10: 16 ug/min via INTRAVENOUS
  Administered 2018-08-11: 20 ug/min via INTRAVENOUS
  Administered 2018-08-12: 12 ug/min via INTRAVENOUS
  Administered 2018-08-13: 8 ug/min via INTRAVENOUS
  Administered 2018-08-14: 35 ug/min via INTRAVENOUS
  Administered 2018-08-15: 40 ug/min via INTRAVENOUS
  Administered 2018-08-15: 37 ug/min via INTRAVENOUS
  Administered 2018-08-15 (×2): 40 ug/min via INTRAVENOUS
  Administered 2018-08-16 (×2): 48 ug/min via INTRAVENOUS
  Administered 2018-08-16 (×2): 40 ug/min via INTRAVENOUS
  Administered 2018-08-17: 52 ug/min via INTRAVENOUS
  Administered 2018-08-17: 53 ug/min via INTRAVENOUS
  Administered 2018-08-17: 52 ug/min via INTRAVENOUS
  Administered 2018-08-17: 50 ug/min via INTRAVENOUS
  Administered 2018-08-18: 58 ug/min via INTRAVENOUS
  Administered 2018-08-18: 56 ug/min via INTRAVENOUS
  Administered 2018-08-18: 60 ug/min via INTRAVENOUS
  Administered 2018-08-18: 56 ug/min via INTRAVENOUS
  Administered 2018-08-18: 53 ug/min via INTRAVENOUS
  Administered 2018-08-19: 60 ug/min via INTRAVENOUS
  Administered 2018-08-19: 70 ug/min via INTRAVENOUS
  Administered 2018-08-19 (×2): 63 ug/min via INTRAVENOUS
  Administered 2018-08-19: 60 ug/min via INTRAVENOUS
  Administered 2018-08-20: 46 ug/min via INTRAVENOUS
  Administered 2018-08-20: 65 ug/min via INTRAVENOUS
  Administered 2018-08-20: 70 ug/min via INTRAVENOUS
  Administered 2018-08-20: 50 ug/min via INTRAVENOUS
  Filled 2018-08-08 (×33): qty 250

## 2018-08-08 NOTE — Plan of Care (Signed)
  Problem: Clinical Measurements: Goal: Diagnostic test results will improve Outcome: Progressing   Problem: Activity: Goal: Risk for activity intolerance will decrease Outcome: Progressing   

## 2018-08-08 NOTE — Progress Notes (Signed)
[facility] Progress Note    Assessment/ Plan:    Acute systolic heart failure ejection fraction 15% severe RV dysfunction with cardiogenic shock.  CVPs up  [place] 1/30 w/ high PCW 25.  Still requiring levophed and milrinone, still requires CRRT for now.  s/p tunneled HD cath 2/4, appreciate IR.  CRRT with UF as able- get CVPs own   Coronary artery disease continues aspirin and statin will need cardiac cath prior to discharge- holding for now in case renal function improves   Acute kidney injury crt 1.34 in August 2019 secondary to acute tubular necrosis and hypoperfusion admission creatinine 3.91.  Echogenic right kidney compatible medical renal disease.  Continues on CRRT for now, started on 1/25. No UOP.   Anemia - have  started darbepoetin.  Completed RBC transfusion 07/29/2018   Hypertension/volume.  Norepinephrine with volume removal through CRRT 100 cc/h when able    Acute gout L hand, on colchicine    Leukocytosis- climbing on steroids, cultures drawn and neg, on steroids    VT: on amio and mexilitine  Subjective:    CRRT up and running- doing better.  Pulling fluid as able.  Pressors down.   Objective:   BP 96/76   Pulse 87   Temp 98.9 F (37.2 C) (Oral)   Resp 18   Ht 6\' 3"  (1.905 m)   Wt 76.1 kg   SpO2 92%   BMI 20.97 kg/m   Intake/Output Summary (Last 24 hours) at 08/08/2018 1052 Last data filed at 08/08/2018 1000 Gross per 24 hour  Intake 2854.49 ml  Output 3624 ml  Net -769.51 ml   Weight change: -1.7 kg  Physical Exam: Gen: NAD, lying flat in bed Neck: R ntunneled HD cath in place, L triple lumen in place.   YJE:[id number], no m/r/g Resp: clear anteriorly, muffled at bases Abd: soft, nontender, some fullness Ext:  LE edema improving  Imaging: Ir Fluoro Guide Cv Line Right  Result Date: 08/07/2018 INDICATION: 63 year old male with a history of renal failure EXAM: TUNNELED PICC LINE WITH ULTRASOUND AND FLUOROSCOPIC GUIDANCE  MEDICATIONS: Vancomycin 1 gm IV. The antibiotic was given in an appropriate time interval prior to skin puncture. ANESTHESIA/SEDATION: Versed 1.5 mg IV; Fentanyl 75 mcg IV; Moderate Sedation Time:  17 minutes The patient was continuously monitored during the procedure by the interventional radiology nurse under my direct supervision. FLUOROSCOPY TIME:  Fluoroscopy Time: 0 minutes 36 seconds (3 mGy). COMPLICATIONS: None PROCEDURE: The procedure, risks, benefits, and alternatives were explained to the patient. Questions regarding the procedure were encouraged and answered. The patient understands and consents to the procedure. The right neck and chest was prepped with chlorhexidine, and draped in the usual sterile fashion using maximum barrier technique (cap and mask, sterile gown, sterile gloves, large sterile sheet, hand hygiene and cutaneous antiseptic). Antibiotic prophylaxis was initiated with 1.0g vancomycin administered IV within 1 hour prior to skin incision. Local anesthesia was attained by infiltration with 1% lidocaine without epinephrine. Ultrasound demonstrated patency of the right internal jugular vein, and this was documented with an image. Under real-time ultrasound guidance, this vein was accessed with a 21 gauge micropuncture needle and image documentation was performed. A small dermatotomy was made at the access site with an 11 scalpel. A 0.018" wire was advanced into the SVC and the access needle exchanged for a 95F micropuncture vascular sheath. The 0.018" wire was then removed and a 0.035" wire advanced into the IVC. Upon withdrawal of the 018 wire, the wire  was marked for appropriate length of the internal portion of the catheter. A 23 cm tip to cuff split tip catheter was selected. The skin and subcutaneous tissues of the right chest wall were then generously infiltrated with 1% lidocaine without epinephrine from the chest wall to the puncture site at the right IJ. The hemodialysis catheter was  then back tunneled from the right chest wall incision to the dermatotomy at the right neck. The venous access site was then serially dilated and a peel away vascular sheath placed over the wire. The wire was removed and the catheter was placed through the peel-away sheath. The catheter tip is positioned in the upper right atrium. This was documented with a spot image. Both ports of the hemodialysis catheter were then tested for excellent function. The ports were then locked with heparinized lock. The incision at the neck was then sealed with Dermabond. Patient tolerated the procedure well and remained hemodynamically stable throughout. No complications were encountered and no significant blood loss was encountered. FINDINGS: After catheter placement, the tip lies within the superior cavoatrial junction. The catheter aspirates and flushes normally and is ready for immediate use. IMPRESSION: Status post right IJ tunneled hemodialysis catheter measuring 23 cm tip to cuff. Catheter ready for use. Signed, Dulcy Fanny. Dellia Nims, RPVI [facility] Radiology Electronically Signed   By: Corrie Mckusick D.O.   On: 08/07/2018 14:03   Ir US Guide Vasc Access Right  Result Date: 08/07/2018 INDICATION: 63 year old male with a history of renal failure EXAM: TUNNELED PICC LINE WITH ULTRASOUND AND FLUOROSCOPIC GUIDANCE MEDICATIONS: Vancomycin 1 gm IV. The antibiotic was given in an appropriate time interval prior to skin puncture. ANESTHESIA/SEDATION: Versed 1.5 mg IV; Fentanyl 75 mcg IV; Moderate Sedation Time:  17 minutes The patient was continuously monitored during the procedure by the interventional radiology nurse under my direct supervision. FLUOROSCOPY TIME:  Fluoroscopy Time: 0 minutes 36 seconds (3 mGy). COMPLICATIONS: None PROCEDURE: The procedure, risks, benefits, and alternatives were explained to the patient. Questions regarding the procedure were encouraged and answered.  The patient understands and consents to the procedure. The right neck and chest was prepped with chlorhexidine, and draped in the usual sterile fashion using maximum barrier technique (cap and mask, sterile gown, sterile gloves, large sterile sheet, hand hygiene and cutaneous antiseptic). Antibiotic prophylaxis was initiated with 1.0g vancomycin administered IV within 1 hour prior to skin incision. Local anesthesia was attained by infiltration with 1% lidocaine without epinephrine. Ultrasound demonstrated patency of the right internal jugular vein, and this was documented with an image. Under real-time ultrasound guidance, this vein was accessed with a 21 gauge micropuncture needle and image documentation was performed. A small dermatotomy was made at the access site with an 11 scalpel. A 0.018" wire was advanced into the SVC and the access needle exchanged for a 70F micropuncture vascular sheath. The 0.018" wire was then removed and a 0.035" wire advanced into the IVC. Upon withdrawal of the 018 wire, the wire was marked for appropriate length of the internal portion of the catheter. A 23 cm tip to cuff split tip catheter was selected. The skin and subcutaneous tissues of the right chest wall were then generously infiltrated with 1% lidocaine without epinephrine from the chest wall to the puncture site at the right IJ. The hemodialysis catheter was then back tunneled from the right chest wall incision to the dermatotomy at the right neck. The venous access site was then serially dilated and a  peel away vascular sheath placed over the wire. The wire was removed and the catheter was placed through the peel-away sheath. The catheter tip is positioned in the upper right atrium. This was documented with a spot image. Both ports of the hemodialysis catheter were then tested for excellent function. The ports were then locked with heparinized lock. The incision at the neck was then sealed with Dermabond. Patient tolerated  the procedure well and remained hemodynamically stable throughout. No complications were encountered and no significant blood loss was encountered. FINDINGS: After catheter placement, the tip lies within the superior cavoatrial junction. The catheter aspirates and flushes normally and is ready for immediate use. IMPRESSION: Status post right IJ tunneled hemodialysis catheter measuring 23 cm tip to cuff. Catheter ready for use. Signed, Dulcy Fanny. Dellia Nims, RPVI [facility] Radiology Electronically Signed   By: Corrie Mckusick D.O.   On: 08/07/2018 14:03    Labs: BMET Recent Labs  Lab 08/05/18 1618 08/06/18 0435 08/06/18 1602 08/06/18 2339 08/07/18 0422 08/07/18 2238 08/08/18 0427  NA 129* 132* 130* 128* 128* 129* 132*  K 4.7 4.6 4.6 4.7 4.6 5.1 4.5  CL 96* 95* 97* 93* 95* 95* 98  CO2 22 23 22  21* 20* 21* 25  GLUCOSE 173* 133* 147* 137* 124* 147* 120*  BUN 33* 26* 37* 39* 46* 39* 32*  CREATININE 3.52* 2.92* 3.82* 4.46* 4.23* 4.39* 3.95*  CALCIUM 7.7* 7.9* 7.6* 7.7* 7.8* 7.5* 7.9*  PHOS 3.2 2.8 2.8 2.6 2.8 3.2 3.0   CBC Recent Labs  Lab 08/05/18 0307 08/06/18 0435 08/07/18 0458 08/08/18 0427  WBC 18.2* 16.2* 17.3* 13.1*  HGB 8.2* 8.6* 7.9* 9.3*  HCT 27.6* 29.3* 25.8* 30.8*  MCV 73.4* 75.1* 74.6* 75.9*  PLT 133* 118* 101* 90*    Medications:    . sodium chloride   Intravenous Once  . aspirin EC  81 mg Oral Daily  . atorvastatin  80 mg Oral q1800  . chlorhexidine  15 mL Mouth Rinse BID  . Chlorhexidine Gluconate Cloth  6 each Topical Daily  . darbepoetin (ARANESP) injection - NON-DIALYSIS  200 mcg Subcutaneous Q Thu-1800  . hydrocortisone sod succinate (SOLU-CORTEF) inj  50 mg Intravenous Daily  . mouth rinse  15 mL Mouth Rinse BID  . mexiletine  150 mg Oral Q8H  . midodrine  5 mg Oral TID WC  . pantoprazole  40 mg Oral BID  . sodium chloride flush  3 mL Intravenous Q12H  . vancomycin variable dose per unstable renal  function (pharmacist dosing)   Does not apply See admin instructions      Madelon Lips, MD [facility] pgr [phone] Cell [phone] 08/08/2018, 10:52 AM

## 2018-08-08 NOTE — Progress Notes (Addendum)
Advanced Heart Failure Rounding Note  PCP-Cardiologist: No primary care provider on file.   Subjective:    NE 30 -> 16. On milrinone 0.25. Started midodrine yesterday. SBP 90s. Coox 57%.  Had tunneled HD cath placed yesterday. Pulling 100 ml/hr now. CVP ~9. Anuric. Weight down 4 lbs.   Remains on IV amio and mexilitene. No VT.  On empiric Vanc/Cefepime. WBC 16.2 -> 17.3 -> 13.1. Blood cx 2/1 NGTD. Afebrile.   Received 2 uPRBCs yesterday. Hemoglobin 7.9 -> 9.3  Denies CP or SOB. Feels better today. Wrist continues to improve. Had some thrush patches overnight, but now resolved with frequent mouth swabs. Cough much improved.   Echo 07/11/2018 LVEF 15%, with severe RV failure.   RHC 08/02/2018 On milrinone 0.125 and NE 14 RA = 13 RV = 51/18 PA = 50/24 (34) PCW = 25 (v = 35) Fick cardiac output/index = 5.42/2.59 Thermo CO/CI = 5.49/2.63 PVR = 1.6 WU FA sat = 97% PA sat = 54%, 58%  Objective:   Weight Range: 76.1 kg Body mass index is 20.97 kg/m.   Vital Signs:   Temp:  [97.4 F (36.3 C)-98.2 F (36.8 C)] 97.6 F (36.4 C) (02/05 0400) Pulse Rate:  [80-91] 82 (02/05 0700) Resp:  [9-36] 17 (02/05 0700) BP: (90-125)/(62-83) 90/72 (02/05 0700) SpO2:  [90 %-100 %] 94 % (02/05 0700) Weight:  [76.1 kg] 76.1 kg (02/05 0500) Last BM Date: 08/07/18  Weight change: Filed Weights   08/06/18 0500 08/07/18 0500 08/08/18 0500  Weight: 75.6 kg 77.8 kg 76.1 kg   Intake/Output:   Intake/Output Summary (Last 24 hours) at 08/08/2018 0753 Last data filed at 08/08/2018 0700 Gross per 24 hour  Intake 2884.18 ml  Output 3134 ml  Net -249.82 ml    Physical Exam    General: No resp difficulty. HEENT: Normal Neck: Supple. JVP ~10. Carotids 2+ bilat; no bruits. No thyromegaly or nodule noted. Left IJ TLC. Rt Hartly tunneled HD cath.  Cor: PMI nondisplaced. RRR, +s3 Lungs: CTAB, normal effort. Abdomen: Soft, non-tender, non-distended, no HSM. No bruits or masses. +BS  Extremities:  No cyanosis, clubbing, or rash. R and LLE no edema. Left wrist mildly edematous  Neuro: Alert & orientedx3, cranial nerves grossly intact. moves all 4 extremities w/o difficulty. Affect pleasant  Telemetry   NSR 80s Personally reviewed.   Labs    CBC Recent Labs    08/07/18 0458 08/08/18 0427  WBC 17.3* 13.1*  HGB 7.9* 9.3*  HCT 25.8* 30.8*  MCV 74.6* 75.9*  PLT 101* 90*   Basic Metabolic Panel Recent Labs    08/07/18 0422 08/07/18 2238 08/08/18 0427  NA 128* 129* 132*  K 4.6 5.1 4.5  CL 95* 95* 98  CO2 20* 21* 25  GLUCOSE 124* 147* 120*  BUN 46* 39* 32*  CREATININE 4.23* 4.39* 3.95*  CALCIUM 7.8* 7.5* 7.9*  MG 2.5*  --  2.5*  PHOS 2.8 3.2 3.0   Liver Function Tests Recent Labs    08/07/18 0422 08/07/18 2238 08/08/18 0427  AST 74*  --   --   ALT 185*  --   --   ALKPHOS 78  --   --   BILITOT 2.0*  --   --   PROT 5.8*  --   --   ALBUMIN 2.7*  2.6* 2.7* 2.7*   No results for input(s): LIPASE, AMYLASE in the last 72 hours. Cardiac Enzymes No results for input(s): CKTOTAL, CKMB, CKMBINDEX, TROPONINI in the last  72 hours.  BNP: BNP (last 3 results) Recent Labs    07/10/2018 1651  BNP 4,188.4*    ProBNP (last 3 results) No results for input(s): PROBNP in the last 8760 hours.   D-Dimer No results for input(s): DDIMER in the last 72 hours. Hemoglobin A1C No results for input(s): HGBA1C in the last 72 hours. Fasting Lipid Panel No results for input(s): CHOL, HDL, LDLCALC, TRIG, CHOLHDL, LDLDIRECT in the last 72 hours. Thyroid Function Tests No results for input(s): TSH, T4TOTAL, T3FREE, THYROIDAB in the last 72 hours.  Invalid input(s): FREET3  Other results:   Imaging    Ir Fluoro Guide Cv Line Right  Result Date: 08/07/2018 INDICATION: 63 year old male with a history of renal failure EXAM: TUNNELED PICC LINE WITH ULTRASOUND AND FLUOROSCOPIC GUIDANCE MEDICATIONS: Vancomycin 1 gm IV. The antibiotic was given in an appropriate time interval  prior to skin puncture. ANESTHESIA/SEDATION: Versed 1.5 mg IV; Fentanyl 75 mcg IV; Moderate Sedation Time:  17 minutes The patient was continuously monitored during the procedure by the interventional radiology nurse under my direct supervision. FLUOROSCOPY TIME:  Fluoroscopy Time: 0 minutes 36 seconds (3 mGy). COMPLICATIONS: None PROCEDURE: The procedure, risks, benefits, and alternatives were explained to the patient. Questions regarding the procedure were encouraged and answered. The patient understands and consents to the procedure. The right neck and chest was prepped with chlorhexidine, and draped in the usual sterile fashion using maximum barrier technique (cap and mask, sterile gown, sterile gloves, large sterile sheet, hand hygiene and cutaneous antiseptic). Antibiotic prophylaxis was initiated with 1.0g vancomycin administered IV within 1 hour prior to skin incision. Local anesthesia was attained by infiltration with 1% lidocaine without epinephrine. Ultrasound demonstrated patency of the right internal jugular vein, and this was documented with an image. Under real-time ultrasound guidance, this vein was accessed with a 21 gauge micropuncture needle and image documentation was performed. A small dermatotomy was made at the access site with an 11 scalpel. A 0.018" wire was advanced into the SVC and the access needle exchanged for a 33F micropuncture vascular sheath. The 0.018" wire was then removed and a 0.035" wire advanced into the IVC. Upon withdrawal of the 018 wire, the wire was marked for appropriate length of the internal portion of the catheter. A 23 cm tip to cuff split tip catheter was selected. The skin and subcutaneous tissues of the right chest wall were then generously infiltrated with 1% lidocaine without epinephrine from the chest wall to the puncture site at the right IJ. The hemodialysis catheter was then back tunneled from the right chest wall incision to the dermatotomy at the right  neck. The venous access site was then serially dilated and a peel away vascular sheath placed over the wire. The wire was removed and the catheter was placed through the peel-away sheath. The catheter tip is positioned in the upper right atrium. This was documented with a spot image. Both ports of the hemodialysis catheter were then tested for excellent function. The ports were then locked with heparinized lock. The incision at the neck was then sealed with Dermabond. Patient tolerated the procedure well and remained hemodynamically stable throughout. No complications were encountered and no significant blood loss was encountered. FINDINGS: After catheter placement, the tip lies within the superior cavoatrial junction. The catheter aspirates and flushes normally and is ready for immediate use. IMPRESSION: Status post right IJ tunneled hemodialysis catheter measuring 23 cm tip to cuff. Catheter ready for use. Signed, Dulcy Fanny. Earleen Newport, DO, RPVI  [facility] Radiology Electronically Signed   By: Corrie Mckusick D.O.   On: 08/07/2018 14:03   Ir US Guide Vasc Access Right  Result Date: 08/07/2018 INDICATION: 64 year old male with a history of renal failure EXAM: TUNNELED PICC LINE WITH ULTRASOUND AND FLUOROSCOPIC GUIDANCE MEDICATIONS: Vancomycin 1 gm IV. The antibiotic was given in an appropriate time interval prior to skin puncture. ANESTHESIA/SEDATION: Versed 1.5 mg IV; Fentanyl 75 mcg IV; Moderate Sedation Time:  17 minutes The patient was continuously monitored during the procedure by the interventional radiology nurse under my direct supervision. FLUOROSCOPY TIME:  Fluoroscopy Time: 0 minutes 36 seconds (3 mGy). COMPLICATIONS: None PROCEDURE: The procedure, risks, benefits, and alternatives were explained to the patient. Questions regarding the procedure were encouraged and answered. The patient understands and consents to the procedure. The right neck and chest was  prepped with chlorhexidine, and draped in the usual sterile fashion using maximum barrier technique (cap and mask, sterile gown, sterile gloves, large sterile sheet, hand hygiene and cutaneous antiseptic). Antibiotic prophylaxis was initiated with 1.0g vancomycin administered IV within 1 hour prior to skin incision. Local anesthesia was attained by infiltration with 1% lidocaine without epinephrine. Ultrasound demonstrated patency of the right internal jugular vein, and this was documented with an image. Under real-time ultrasound guidance, this vein was accessed with a 21 gauge micropuncture needle and image documentation was performed. A small dermatotomy was made at the access site with an 11 scalpel. A 0.018" wire was advanced into the SVC and the access needle exchanged for a 52F micropuncture vascular sheath. The 0.018" wire was then removed and a 0.035" wire advanced into the IVC. Upon withdrawal of the 018 wire, the wire was marked for appropriate length of the internal portion of the catheter. A 23 cm tip to cuff split tip catheter was selected. The skin and subcutaneous tissues of the right chest wall were then generously infiltrated with 1% lidocaine without epinephrine from the chest wall to the puncture site at the right IJ. The hemodialysis catheter was then back tunneled from the right chest wall incision to the dermatotomy at the right neck. The venous access site was then serially dilated and a peel away vascular sheath placed over the wire. The wire was removed and the catheter was placed through the peel-away sheath. The catheter tip is positioned in the upper right atrium. This was documented with a spot image. Both ports of the hemodialysis catheter were then tested for excellent function. The ports were then locked with heparinized lock. The incision at the neck was then sealed with Dermabond. Patient tolerated the procedure well and remained hemodynamically stable throughout. No complications  were encountered and no significant blood loss was encountered. FINDINGS: After catheter placement, the tip lies within the superior cavoatrial junction. The catheter aspirates and flushes normally and is ready for immediate use. IMPRESSION: Status post right IJ tunneled hemodialysis catheter measuring 23 cm tip to cuff. Catheter ready for use. Signed, Dulcy Fanny. Dellia Nims, RPVI [facility] Radiology Electronically Signed   By: Corrie Mckusick D.O.   On: 08/07/2018 14:03     Medications:     Scheduled Medications: . sodium chloride   Intravenous Once  . aspirin EC  81 mg Oral Daily  . atorvastatin  80 mg Oral q1800  . chlorhexidine  15 mL Mouth Rinse BID  . Chlorhexidine Gluconate Cloth  6 each Topical Daily  . darbepoetin (ARANESP) injection - NON-DIALYSIS  200  mcg Subcutaneous Q Thu-1800  . hydrocortisone sod succinate (SOLU-CORTEF) inj  50 mg Intravenous Daily  . mouth rinse  15 mL Mouth Rinse BID  . mexiletine  150 mg Oral Q8H  . midodrine  5 mg Oral TID WC  . pantoprazole  40 mg Oral BID  . sodium chloride flush  3 mL Intravenous Q12H  . vancomycin variable dose per unstable renal function (pharmacist dosing)   Does not apply See admin instructions    Infusions: .  prismasol BGK 4/2.5 400 mL/hr at 08/08/18 0346  .  prismasol BGK 4/2.5 200 mL/hr at 08/07/18 1431  . sodium chloride    . sodium chloride Stopped (08/07/18 1901)  . amiodarone 30 mg/hr (08/08/18 0646)  . heparin 10,000 units/ 20 mL infusion syringe 1,550 Units/hr (08/08/18 0700)  . milrinone 0.25 mcg/kg/min (08/08/18 0600)  . norepinephrine (LEVOPHED) Adult infusion 16 mcg/min (08/08/18 0600)  . prismasol BGK 4/2.5 2,000 mL/hr at 08/08/18 0720  . vasopressin (PITRESSIN) infusion - *FOR SHOCK*      PRN Medications: Place/Maintain arterial line **AND** sodium chloride, sodium chloride, acetaminophen, heparin, heparin, nitroGLYCERIN, ondansetron (ZOFRAN) IV, sodium  chloride, sodium chloride flush   Patient Profile   62 y.o.malewith past medical history of hypertension, hyperlipidemia admitted with recent but late presenting inferior lateral myocardial infarction, cardiogenic shock, acute renal failure and microcytic anemia. Echocardiogram showed severe LV and RV dysfunction and moderate mitral regurgitation.  Assessment/Plan   1. Acute systolic HF with cardiogenic shock due to OOH MI - Echo 1/5 EF 15% severe RV dysfunction and moderate MR - Initial co-ox 28% but this was drawn femorally so inaccurate.  - Coox 57% on milrinone 0.25 mcg/kg/min and norepi. NE @ 17. SBP 90s. - CVP 9 - Volume managed with CRRT. Now had tunneled HD cath. - No ACE/ARB/ARNI or b-blocker with shock and AKI - Continue to wean stress dose steroids.   2. CAD - s/p OOH in inferolateral MI - Initial trop 28, ten trended down.   - No s/s ischemia. - Continue ASA and statin No b-blocker with shock - He will need cath prior to discharge but would hold until we see if kidneys recover, or if transitions to HD.   3. AKI/ESRD with uremia - likely due to ATN and hypoperfusion. Initial BUN/CR 198/3.91 - Baseline creatinine normal several months ago - Renal following CRRT started on 1/25 - Renal u/s 1/26: Mildly echogenic right kidney, compatible with chronic medical renal disease. - Now had tunneled HD cath. Pulling 100 mls/hr.   4. Microcytic anemia due to IDA - Has received 2u RBCS 1/26. Repeat transfusion for hgb <= 7.5 - Hgb 9.2 -> 8.2 -> 8.6 -> 7.9. Likely from CRRT circuit blood loss. Unable to rinse back x3. Received 2 uPRBC 2/5 -> 7.3 today.  - Smear without schistocytes - Iron stores low. Has received Feraheme - Will eventually need GI w/u - Continue protonix  5. Shock liver - Improving.   6. VT - Quiescent on amiodarone gtt and PO mexilitene. No further VT - Keep K > 4.0 Mg > 2.0. K 4.5, mag 2.5  7. L wrist pain - Suspect gout. Improving with steroids  and colchicine. - Uric acid normal at 3.2  8. Leukocytosis - likely due to steroids. Started to wean. Cultures NGTD x 24 hours  - Now on Vanc/Cefepime. WBC trending down 13.1. Afebrile.   9. Hyponatremia - Na 132   Length of Stay: [address] NP  08/08/2018, 7:53  AM  Advanced Heart Failure Team Pager [phone] (M-F; 7a - 4p)  Please contact [facility] for night-coverage after hours (4p -7a ) and weekends on [email or web address]  Agree with above.  He remains anuric on dual pressors. Dialysis cath replaced yesterday and now has tunneled access. Catheter working well. Able to pull - 100. CVP remains elevated but improving. Co-ox 57%  Lying in bed JVP to jaw Cor RRR  Left chest tunneled cath Lungs CTA Ab soft NT Ext warm no edema  Remains on dual pressors and on HD but is a bit better today. Will increase midodrine to try and faciliate NE wean. If renal function not recovering may need cath sooner than later. Options still very limited. Leukocytosis improving with abx. Cx remain negative.   CRITICAL CARE Performed by: Glori Bickers  Total critical care time: 35 minutes  Critical care time was exclusive of separately billable procedures and treating other patients.  Critical care was necessary to treat or prevent imminent or life-threatening deterioration.  Critical care was time spent personally by me (independent of midlevel providers or residents) on the following activities: development of treatment plan with patient and/or surrogate as well as nursing, discussions with consultants, evaluation of patient's response to treatment, examination of patient, obtaining history from patient or surrogate, ordering and performing treatments and interventions, ordering and review of laboratory studies, ordering and review of radiographic studies, pulse oximetry and re-evaluation of patient's condition.  Glori Bickers, MD  10:20 PM

## 2018-08-08 NOTE — Plan of Care (Signed)
  Problem: Health Behavior/Discharge Planning: Goal: Ability to manage health-related needs will improve Outcome: Progressing   Problem: Clinical Measurements: Goal: Ability to maintain clinical measurements within normal limits will improve Outcome: Progressing Goal: Will remain free from infection Outcome: Progressing Goal: Diagnostic test results will improve Outcome: Progressing Goal: Respiratory complications will improve Outcome: Progressing Goal: Cardiovascular complication will be avoided Outcome: Progressing   Problem: Activity: Goal: Risk for activity intolerance will decrease Outcome: Progressing   Problem: Nutrition: Goal: Adequate nutrition will be maintained Outcome: Progressing   Problem: Elimination: Goal: Will not experience complications related to bowel motility Outcome: Progressing Goal: Will not experience complications related to urinary retention Outcome: Progressing   Problem: Pain Managment: Goal: General experience of comfort will improve Outcome: Progressing   Problem: Safety: Goal: Ability to remain free from injury will improve Outcome: Progressing   Problem: Skin Integrity: Goal: Risk for impaired skin integrity will decrease Outcome: Progressing   Problem: Education: Goal: Ability to demonstrate management of disease process will improve Outcome: Progressing   Problem: Activity: Goal: Capacity to carry out activities will improve Outcome: Progressing   Problem: Cardiac: Goal: Ability to achieve and maintain adequate cardiopulmonary perfusion will improve Outcome: Progressing   Problem: Health Behavior/Discharge Planning: Goal: Ability to manage health-related needs will improve Outcome: Progressing   Problem: Clinical Measurements: Goal: Complications related to the disease process or treatment will be avoided or minimized Outcome: Progressing Goal: Dialysis access will remain free of complications Outcome: Progressing    Problem: Activity: Goal: Activity intolerance will improve Outcome: Progressing   Problem: Fluid Volume: Goal: Fluid volume balance will be maintained or improved Outcome: Progressing   Problem: Nutritional: Goal: Ability to make appropriate dietary choices will improve Outcome: Progressing   Problem: Respiratory: Goal: Respiratory symptoms related to disease process will be avoided Outcome: Progressing   Problem: Self-Concept: Goal: Body image disturbance will be avoided or minimized Outcome: Progressing   Problem: Urinary Elimination: Goal: Progression of disease will be identified and treated Outcome: Progressing

## 2018-08-08 NOTE — Progress Notes (Signed)
Pharmacy Antibiotic Note  Samuel Bates is a 63 y.o. male admitted on 07/17/2018 with cardiogenic shock s/p MI at [facility].  Currently on milrinone and norepinephrine for cardiac output and BP support and CRT for AKI .  Pharmacy has been consulted to restart vancomycin and cefepime dosing in setting of in wbc central lines and possible infection.  CRRT was held yesterday 2/3 due to issues with machine, restarted briefly and then malfunctioned again. Pt receive additional line placement yesterday per IR (thought to be cause of machine malfunction). Pt is essentially anuric despite Lasix challenge.  Last received vancomycin on 2/4 at 1300 - VR collected approximately 26 hours after last dose came back subtherapeutic at 10 today. WBC remains slightly elevated at 13 (on concurrent hydrocortisone), afebrile.   Plan: -Continue cefepime 2g IV q12h  -Will resume vancomycin 750 mg IV every 24 hours -Monitor CRRT, clinical pic, cx results, and levels as appropriate    Height: 6\' 3"  (190.5 cm) Weight: 167 lb 12.3 oz (76.1 kg) IBW/kg (Calculated) : 84.5  Temp (24hrs), Avg:97.8 F (36.6 C), Min:97.4 F (36.3 C), Max:98.9 F (37.2 C)  Recent Labs  Lab 08/04/18 0408  08/05/18 0307  08/06/18 0435  08/06/18 2339 08/07/18 0422 08/07/18 0458 08/07/18 2238 08/08/18 0427 08/08/18 1524  WBC 17.5*  --  18.2*  --  16.2*  --   --   --  17.3*  --  13.1*  --   CREATININE 2.73*   < >  --    < > 2.92*   < > 4.46* 4.23*  --  4.39* 3.95* 3.16*  VANCORANDOM  --   --   --   --   --   --   --   --   --   --   --  10   < > = values in this interval not displayed.    Estimated Creatinine Clearance: 26.1 mL/min (A) (by C-G formula based on SCr of 3.16 mg/dL (H)).    Allergies  Allergen Reactions  . Penicillins Hives    Tolerated cefepime 1/20 Did it involve swelling of the face/tongue/throat, SOB, or low BP? YES Did it involve sudden or severe rash/hives, skin peeling, or any reaction on the inside of your mouth  or nose? NO Did you need to seek medical attention at a hospital or doctor's office? YES When did it last happen? 30 years ago If all above answers are "NO", may proceed with cephalosporin use.  . Simvastatin Other (See Comments)    insomnia    Antimicrobials: Vancomycin 1/25 >>1/26; 2/2 >> Cefepime 1/25 x1; 2/2 >>  Antibiotic Regimen Adjustments:  VR 2/5 10: resume vancomycin 750 mg IV every 24 hours  Microbiology: 1/25 BCx: neg 2/1 BCx: ngtd  Antonietta Jewel, PharmD, [place] Clinical Pharmacist  Pager: [phone] Phone: [phone] Please check AMION for all [place] numbers 08/08/2018

## 2018-08-09 ENCOUNTER — Inpatient Hospital Stay (HOSPITAL_COMMUNITY): Payer: Medicaid Other

## 2018-08-09 LAB — CBC
HCT: 31.5 % — ABNORMAL LOW (ref 39.0–52.0)
Hemoglobin: 9.8 g/dL — ABNORMAL LOW (ref 13.0–17.0)
MCH: 24 pg — ABNORMAL LOW (ref 26.0–34.0)
MCHC: 31.1 g/dL (ref 30.0–36.0)
MCV: 77.2 fL — ABNORMAL LOW (ref 80.0–100.0)
Platelets: 99 10*3/uL — ABNORMAL LOW (ref 150–400)
RBC: 4.08 MIL/uL — ABNORMAL LOW (ref 4.22–5.81)
RDW: 29.7 % — ABNORMAL HIGH (ref 11.5–15.5)
WBC: 10.8 10*3/uL — ABNORMAL HIGH (ref 4.0–10.5)
nRBC: 35.7 % — ABNORMAL HIGH (ref 0.0–0.2)

## 2018-08-09 LAB — RENAL FUNCTION PANEL
Albumin: 2.6 g/dL — ABNORMAL LOW (ref 3.5–5.0)
Albumin: 2.7 g/dL — ABNORMAL LOW (ref 3.5–5.0)
Anion gap: 10 (ref 5–15)
Anion gap: 12 (ref 5–15)
BUN: 21 mg/dL (ref 8–23)
BUN: 21 mg/dL (ref 8–23)
CO2: 25 mmol/L (ref 22–32)
CO2: 24 mmol/L (ref 22–32)
CREATININE: 2.49 mg/dL — AB (ref 0.61–1.24)
Calcium: 7.7 mg/dL — ABNORMAL LOW (ref 8.9–10.3)
Calcium: 8 mg/dL — ABNORMAL LOW (ref 8.9–10.3)
Chloride: 99 mmol/L (ref 98–111)
Chloride: 99 mmol/L (ref 98–111)
Creatinine, Ser: 2.59 mg/dL — ABNORMAL HIGH (ref 0.61–1.24)
GFR calc Af Amer: 29 mL/min — ABNORMAL LOW (ref >60)
GFR calc Af Amer: 31 mL/min — ABNORMAL LOW (ref >60)
GFR calc non Af Amer: 25 mL/min — ABNORMAL LOW (ref >60)
GFR calc non Af Amer: 27 mL/min — ABNORMAL LOW (ref >60)
Glucose, Bld: 112 mg/dL — ABNORMAL HIGH (ref 70–99)
Glucose, Bld: 125 mg/dL — ABNORMAL HIGH (ref 70–99)
PHOSPHORUS: 2.3 mg/dL — AB (ref 2.5–4.6)
Phosphorus: 2.5 mg/dL (ref 2.5–4.6)
Potassium: 4.4 mmol/L (ref 3.5–5.1)
Potassium: 4.9 mmol/L (ref 3.5–5.1)
Sodium: 134 mmol/L — ABNORMAL LOW (ref 135–145)
Sodium: 135 mmol/L (ref 135–145)

## 2018-08-09 LAB — COOXEMETRY PANEL
Carboxyhemoglobin: 1.9 % — ABNORMAL HIGH (ref 0.5–1.5)
Carboxyhemoglobin: 1.6 % — ABNORMAL HIGH (ref 0.5–1.5)
METHEMOGLOBIN: 1.7 % — AB (ref 0.0–1.5)
Methemoglobin: 1.2 % (ref 0.0–1.5)
O2 Saturation: 58.7 %
O2 Saturation: 58.7 %
TOTAL HEMOGLOBIN: 10 g/dL — AB (ref 12.0–16.0)
TOTAL HEMOGLOBIN: 10.2 g/dL — AB (ref 12.0–16.0)

## 2018-08-09 LAB — POCT ACTIVATED CLOTTING TIME
ACTIVATED CLOTTING TIME: 191 s
Activated Clotting Time: 186 seconds
Activated Clotting Time: 191 seconds
Activated Clotting Time: 191 seconds
Activated Clotting Time: 191 seconds
Activated Clotting Time: 197 seconds
Activated Clotting Time: 202 seconds
Activated Clotting Time: 208 seconds
Activated Clotting Time: 208 seconds

## 2018-08-09 LAB — CULTURE, BLOOD (ROUTINE X 2)
CULTURE: NO GROWTH
Culture: NO GROWTH
SPECIAL REQUESTS: ADEQUATE
Special Requests: ADEQUATE

## 2018-08-09 LAB — PROCALCITONIN: Procalcitonin: 2.2 ng/mL

## 2018-08-09 LAB — APTT: aPTT: 88 seconds — ABNORMAL HIGH (ref 24–36)

## 2018-08-09 LAB — MAGNESIUM: MAGNESIUM: 2.5 mg/dL — AB (ref 1.7–2.4)

## 2018-08-09 MED ORDER — DARBEPOETIN ALFA 200 MCG/0.4ML IJ SOSY
200.0000 ug | PREFILLED_SYRINGE | INTRAMUSCULAR | Status: DC
  Administered 2018-08-09: 200 ug via INTRAVENOUS
  Filled 2018-08-09 (×3): qty 0.4

## 2018-08-09 MED ORDER — ALTEPLASE 2 MG IJ SOLR
2.0000 mg | Freq: Once | INTRAMUSCULAR | Status: AC
  Administered 2018-08-09: 2 mg
  Filled 2018-08-09: qty 2

## 2018-08-09 MED ORDER — AMIODARONE HCL 200 MG PO TABS
200.0000 mg | ORAL_TABLET | Freq: Two times a day (BID) | ORAL | Status: DC
  Administered 2018-08-09 – 2018-08-20 (×21): 200 mg via ORAL
  Filled 2018-08-09 (×23): qty 1

## 2018-08-09 NOTE — Progress Notes (Signed)
[facility] Progress Note    Assessment/ Plan:    Acute systolic heart failure ejection fraction 15% severe RV dysfunction with cardiogenic shock.  CVPs up  [place] 1/30 w/ high PCW 25.  Still requiring levophed and milrinone, still requires CRRT for now.  s/p tunneled HD cath 2/4, appreciate IR.  CRRT with UF as able- get CVPs down, try to pull 200 mL/ hr today   Coronary artery disease continues aspirin and statin will need cardiac cath prior to discharge- holding for now in case renal function improves   Acute kidney injury crt 1.34 in August 2019 secondary to acute tubular necrosis and hypoperfusion admission creatinine 3.91.  Echogenic right kidney compatible medical renal disease.  Continues on CRRT for now, started on 1/25. No UOP.   Anemia - have  started darbepoetin.  Completed RBC transfusion 07/29/2018 and again 2/4   Hypertension/volume.  Norepinephrine with volume removal through CRRT    Leukocytosis- climbing on steroids, cultures drawn and neg, abx, possible aspiration, getting CXR   VT: on amio and mexilitine  Subjective:    Cough increased, ? aspiration   Objective:   BP 92/72   Pulse 90   Temp (!) 97.3 F (36.3 C) (Oral)   Resp (!) 25   Ht 6\' 3"  (1.905 m)   Wt 73.8 kg   SpO2 97%   BMI 20.34 kg/m   Intake/Output Summary (Last 24 hours) at 08/09/2018 1020 Last data filed at 08/09/2018 1000 Gross per 24 hour  Intake 2456.01 ml  Output 4778 ml  Net -2321.99 ml   Weight change: -2.3 kg  Physical Exam: Gen: NAD, lying flat in bed Neck: R ntunneled HD cath in place, L triple lumen in place.   OZH:YQMVHQIONGE, no m/r/g Resp: L > R inspiratory and expiratory rhonchi Abd: soft, nontender, some fullness Ext:  LE edema improving  Imaging: Ir Fluoro Guide Cv Line Right  Result Date: 08/07/2018 INDICATION: 63 year old male with a history of renal failure EXAM: TUNNELED PICC LINE WITH ULTRASOUND AND FLUOROSCOPIC GUIDANCE MEDICATIONS: Vancomycin  1 gm IV. The antibiotic was given in an appropriate time interval prior to skin puncture. ANESTHESIA/SEDATION: Versed 1.5 mg IV; Fentanyl 75 mcg IV; Moderate Sedation Time:  17 minutes The patient was continuously monitored during the procedure by the interventional radiology nurse under my direct supervision. FLUOROSCOPY TIME:  Fluoroscopy Time: 0 minutes 36 seconds (3 mGy). COMPLICATIONS: None PROCEDURE: The procedure, risks, benefits, and alternatives were explained to the patient. Questions regarding the procedure were encouraged and answered. The patient understands and consents to the procedure. The right neck and chest was prepped with chlorhexidine, and draped in the usual sterile fashion using maximum barrier technique (cap and mask, sterile gown, sterile gloves, large sterile sheet, hand hygiene and cutaneous antiseptic). Antibiotic prophylaxis was initiated with 1.0g vancomycin administered IV within 1 hour prior to skin incision. Local anesthesia was attained by infiltration with 1% lidocaine without epinephrine. Ultrasound demonstrated patency of the right internal jugular vein, and this was documented with an image. Under real-time ultrasound guidance, this vein was accessed with a 21 gauge micropuncture needle and image documentation was performed. A small dermatotomy was made at the access site with an 11 scalpel. A 0.018" wire was advanced into the SVC and the access needle exchanged for a 71F micropuncture vascular sheath. The 0.018" wire was then removed and a 0.035" wire advanced into the IVC. Upon withdrawal of the 018 wire, the wire was marked for appropriate length of the  internal portion of the catheter. A 23 cm tip to cuff split tip catheter was selected. The skin and subcutaneous tissues of the right chest wall were then generously infiltrated with 1% lidocaine without epinephrine from the chest wall to the puncture site at the right IJ. The hemodialysis catheter was then back tunneled from  the right chest wall incision to the dermatotomy at the right neck. The venous access site was then serially dilated and a peel away vascular sheath placed over the wire. The wire was removed and the catheter was placed through the peel-away sheath. The catheter tip is positioned in the upper right atrium. This was documented with a spot image. Both ports of the hemodialysis catheter were then tested for excellent function. The ports were then locked with heparinized lock. The incision at the neck was then sealed with Dermabond. Patient tolerated the procedure well and remained hemodynamically stable throughout. No complications were encountered and no significant blood loss was encountered. FINDINGS: After catheter placement, the tip lies within the superior cavoatrial junction. The catheter aspirates and flushes normally and is ready for immediate use. IMPRESSION: Status post right IJ tunneled hemodialysis catheter measuring 23 cm tip to cuff. Catheter ready for use. Signed, Dulcy Fanny. Dellia Nims, RPVI [facility] Electronically Signed   By: Corrie Mckusick D.O.   On: 08/07/2018 14:03   Ir US Guide Vasc Access Right  Result Date: 08/07/2018 INDICATION: 63 year old male with a history of renal failure EXAM: TUNNELED PICC LINE WITH ULTRASOUND AND FLUOROSCOPIC GUIDANCE MEDICATIONS: Vancomycin 1 gm IV. The antibiotic was given in an appropriate time interval prior to skin puncture. ANESTHESIA/SEDATION: Versed 1.5 mg IV; Fentanyl 75 mcg IV; Moderate Sedation Time:  17 minutes The patient was continuously monitored during the procedure by the interventional radiology nurse under my direct supervision. FLUOROSCOPY TIME:  Fluoroscopy Time: 0 minutes 36 seconds (3 mGy). COMPLICATIONS: None PROCEDURE: The procedure, risks, benefits, and alternatives were explained to the patient. Questions regarding the procedure were encouraged and answered. The patient understands  and consents to the procedure. The right neck and chest was prepped with chlorhexidine, and draped in the usual sterile fashion using maximum barrier technique (cap and mask, sterile gown, sterile gloves, large sterile sheet, hand hygiene and cutaneous antiseptic). Antibiotic prophylaxis was initiated with 1.0g vancomycin administered IV within 1 hour prior to skin incision. Local anesthesia was attained by infiltration with 1% lidocaine without epinephrine. Ultrasound demonstrated patency of the right internal jugular vein, and this was documented with an image. Under real-time ultrasound guidance, this vein was accessed with a 21 gauge micropuncture needle and image documentation was performed. A small dermatotomy was made at the access site with an 11 scalpel. A 0.018" wire was advanced into the SVC and the access needle exchanged for a 75F micropuncture vascular sheath. The 0.018" wire was then removed and a 0.035" wire advanced into the IVC. Upon withdrawal of the 018 wire, the wire was marked for appropriate length of the internal portion of the catheter. A 23 cm tip to cuff split tip catheter was selected. The skin and subcutaneous tissues of the right chest wall were then generously infiltrated with 1% lidocaine without epinephrine from the chest wall to the puncture site at the right IJ. The hemodialysis catheter was then back tunneled from the right chest wall incision to the dermatotomy at the right neck. The venous access site was then serially dilated and a peel away vascular sheath placed over the  wire. The wire was removed and the catheter was placed through the peel-away sheath. The catheter tip is positioned in the upper right atrium. This was documented with a spot image. Both ports of the hemodialysis catheter were then tested for excellent function. The ports were then locked with heparinized lock. The incision at the neck was then sealed with Dermabond. Patient tolerated the procedure well and  remained hemodynamically stable throughout. No complications were encountered and no significant blood loss was encountered. FINDINGS: After catheter placement, the tip lies within the superior cavoatrial junction. The catheter aspirates and flushes normally and is ready for immediate use. IMPRESSION: Status post right IJ tunneled hemodialysis catheter measuring 23 cm tip to cuff. Catheter ready for use. Signed, Dulcy Fanny. Dellia Nims, RPVI [facility] Electronically Signed   By: Corrie Mckusick D.O.   On: 08/07/2018 14:03    Labs: BMET Recent Labs  Lab 08/06/18 1602 08/06/18 2339 08/07/18 0422 08/07/18 2238 08/08/18 0427 08/08/18 1524 08/09/18 0556  NA 130* 128* 128* 129* 132* 132* 134*  K 4.6 4.7 4.6 5.1 4.5 4.7 4.4  CL 97* 93* 95* 95* 98 97* 99  CO2 22 21* 20* 21* 25 22 25   GLUCOSE 147* 137* 124* 147* 120* 131* 112*  BUN 37* 39* 46* 39* 32* 26* 21  CREATININE 3.82* 4.46* 4.23* 4.39* 3.95* 3.16* 2.59*  CALCIUM 7.6* 7.7* 7.8* 7.5* 7.9* 7.8* 7.7*  PHOS 2.8 2.6 2.8 3.2 3.0 2.5 2.3*   CBC Recent Labs  Lab 08/06/18 0435 08/07/18 0458 08/08/18 0427 08/09/18 0556  WBC 16.2* 17.3* 13.1* 10.8*  HGB 8.6* 7.9* 9.3* 9.8*  HCT 29.3* 25.8* 30.8* 31.5*  MCV 75.1* 74.6* 75.9* 77.2*  PLT 118* 101* 90* 99*    Medications:    . sodium chloride   Intravenous Once  . aspirin EC  81 mg Oral Daily  . atorvastatin  80 mg Oral q1800  . chlorhexidine  15 mL Mouth Rinse BID  . Chlorhexidine Gluconate Cloth  6 each Topical Daily  . darbepoetin (ARANESP) injection - NON-DIALYSIS  200 mcg Subcutaneous Q Thu-1800  . mouth rinse  15 mL Mouth Rinse BID  . mexiletine  150 mg Oral Q8H  . midodrine  10 mg Oral TID WC  . pantoprazole  40 mg Oral BID  . sodium chloride flush  3 mL Intravenous Q12H      Madelon Lips, MD [facility] pgr [phone] Cell [phone] 08/09/2018, 10:20 AM

## 2018-08-09 NOTE — Progress Notes (Signed)
Received page from RN. Pt having increased O2 requirement, now on 10 L [place] with sats 90-91%. CXR shows CHF. Now pulling 250 mls/hr with CRRT. He is not coughing with eating, but describes sputum as "the color of what he last ate". Discussed with Dr Haroldine Laws. Will check procalcitonin and monitor for now.  Georgiana Shore, NP

## 2018-08-09 NOTE — Progress Notes (Addendum)
Advanced Heart Failure Rounding Note  PCP-Cardiologist: No primary care provider on file.   Subjective:    NE @ 15. Remains on milrinone 0.25. Midodrine increased yesterday. Coox 59% (off trialysis cath, CVP port with sluggish blood return).   Remains on CRRT. Pulling 100 mls/hr. CVP ~12. Anuric. Weight down 5 more lbs.  Remains on IV amio and mexilitene. No VT.  On empiric Vanc/Cefepime. WBC 16.2 -> 17.3 -> 13.1 -> 10.8. Blood cx 2/1 NGTD. Afebrile.  Received 2 uPRBCs 2/4. Hemoglobin stable 9.8  Denies CP or SOB. More coughing this morning. Productive and "the color of whatever he ate last". No coughing after eating. More fatigued today. Increased O2 requirement to 5L.   Echo 07/13/2018 LVEF 15%, with severe RV failure.   RHC 08/02/2018 On milrinone 0.125 and NE 14 RA = 13 RV = 51/18 PA = 50/24 (34) PCW = 25 (v = 35) Fick cardiac output/index = 5.42/2.59 Thermo CO/CI = 5.49/2.63 PVR = 1.6 WU FA sat = 97% PA sat = 54%, 58%  Objective:   Weight Range: 73.8 kg Body mass index is 20.34 kg/m.   Vital Signs:   Temp:  [97.3 F (36.3 C)-98.6 F (37 C)] 97.3 F (36.3 C) (02/06 0700) Pulse Rate:  [29-92] 89 (02/06 0900) Resp:  [12-31] 23 (02/06 0900) BP: (88-133)/(64-96) 100/73 (02/06 0900) SpO2:  [88 %-100 %] 91 % (02/06 0900) Weight:  [73.8 kg] 73.8 kg (02/06 0615) Last BM Date: 08/08/18  Weight change: Filed Weights   08/07/18 0500 08/08/18 0500 08/09/18 0615  Weight: 77.8 kg 76.1 kg 73.8 kg   Intake/Output:   Intake/Output Summary (Last 24 hours) at 08/09/2018 0915 Last data filed at 08/09/2018 0900 Gross per 24 hour  Intake 2140.74 ml  Output 4908 ml  Net -2767.26 ml    Physical Exam    General: No resp difficulty. HEENT: Normal Neck: Supple. JVP 12-13. Carotids 2+ bilat; no bruits. No thyromegaly or nodule noted. Left IJ TLC. Right [place] tunneled HD cath.  Cor: PMI nondisplaced. RRR, +s3.  Lungs: coarse. Abdomen: Soft, non-tender, non-distended, no  HSM. No bruits or masses. +BS  Extremities: No cyanosis, clubbing, or rash. R and LLE no edema.  Neuro: Alert & orientedx3, cranial nerves grossly intact. moves all 4 extremities w/o difficulty. Affect pleasant  Telemetry   NSR 80s. Personally reviewed.   Labs    CBC Recent Labs    08/08/18 0427 08/09/18 0556  WBC 13.1* 10.8*  HGB 9.3* 9.8*  HCT 30.8* 31.5*  MCV 75.9* 77.2*  PLT 90* 99*   Basic Metabolic Panel Recent Labs    08/08/18 0427 08/08/18 1524 08/09/18 0556  NA 132* 132* 134*  K 4.5 4.7 4.4  CL 98 97* 99  CO2 25 22 25   GLUCOSE 120* 131* 112*  BUN 32* 26* 21  CREATININE 3.95* 3.16* 2.59*  CALCIUM 7.9* 7.8* 7.7*  MG 2.5*  --  2.5*  PHOS 3.0 2.5 2.3*   Liver Function Tests Recent Labs    08/07/18 0422  08/08/18 1524 08/09/18 0556  AST 74*  --   --   --   ALT 185*  --   --   --   ALKPHOS 78  --   --   --   BILITOT 2.0*  --   --   --   PROT 5.8*  --   --   --   ALBUMIN 2.7*  2.6*   < > 2.6* 2.6*   < > =  values in this interval not displayed.   No results for input(s): LIPASE, AMYLASE in the last 72 hours. Cardiac Enzymes No results for input(s): CKTOTAL, CKMB, CKMBINDEX, TROPONINI in the last 72 hours.  BNP: BNP (last 3 results) Recent Labs    07/19/2018 1651  BNP 4,188.4*    ProBNP (last 3 results) No results for input(s): PROBNP in the last 8760 hours.   D-Dimer No results for input(s): DDIMER in the last 72 hours. Hemoglobin A1C No results for input(s): HGBA1C in the last 72 hours. Fasting Lipid Panel No results for input(s): CHOL, HDL, LDLCALC, TRIG, CHOLHDL, LDLDIRECT in the last 72 hours. Thyroid Function Tests No results for input(s): TSH, T4TOTAL, T3FREE, THYROIDAB in the last 72 hours.  Invalid input(s): FREET3  Other results:   Imaging    No results found.   Medications:     Scheduled Medications: . sodium chloride   Intravenous Once  . aspirin EC  81 mg Oral Daily  . atorvastatin  80 mg Oral q1800  .  chlorhexidine  15 mL Mouth Rinse BID  . Chlorhexidine Gluconate Cloth  6 each Topical Daily  . darbepoetin (ARANESP) injection - NON-DIALYSIS  200 mcg Subcutaneous Q Thu-1800  . mouth rinse  15 mL Mouth Rinse BID  . mexiletine  150 mg Oral Q8H  . midodrine  10 mg Oral TID WC  . pantoprazole  40 mg Oral BID  . sodium chloride flush  3 mL Intravenous Q12H    Infusions: .  prismasol BGK 4/2.5 400 mL/hr at 08/09/18 0549  .  prismasol BGK 4/2.5 200 mL/hr at 08/08/18 1708  . sodium chloride    . sodium chloride Stopped (08/08/18 1126)  . amiodarone 30 mg/hr (08/09/18 0800)  . ceFEPime (MAXIPIME) IV 2 g (08/09/18 0907)  . heparin 10,000 units/ 20 mL infusion syringe 1,700 Units/hr (08/09/18 0434)  . milrinone 0.25 mcg/kg/min (08/09/18 0913)  . norepinephrine (LEVOPHED) Adult infusion 15 mcg/min (08/09/18 0800)  . prismasol BGK 4/2.5 2,000 mL/hr at 08/09/18 0806  . vancomycin Stopped (08/08/18 1918)    PRN Medications: Place/Maintain arterial line **AND** sodium chloride, sodium chloride, acetaminophen, heparin, heparin, nitroGLYCERIN, ondansetron (ZOFRAN) IV, sodium chloride, sodium chloride flush   Patient Profile   62 y.o.malewith past medical history of hypertension, hyperlipidemia admitted with recent but late presenting inferior lateral myocardial infarction, cardiogenic shock, acute renal failure and microcytic anemia. Echocardiogram showed severe LV and RV dysfunction and moderate mitral regurgitation.  Assessment/Plan   1. Acute systolic HF with cardiogenic shock due to OOH MI - Echo 1/5 EF 15% severe RV dysfunction and moderate MR - Initial co-ox 28% but this was drawn femorally so inaccurate.  - Coox 59% (drawn from trialysis cath) on milrinone 0.25 mcg/kg/min and norepi. NE @ 15. SBP 90s. - CVP 12-13 - Continue midodrine 10 mg TID. - Volume managed with CRRT. Now had tunneled HD cath. Planning to increase pull to 100-200 mls/hr as tolerated.  - No ACE/ARB/ARNI or  b-blocker with shock and AKI - Continue to wean stress dose steroids.   2. CAD - s/p OOH in inferolateral MI - Initial trop 28, ten trended down.   - No s/s ischemia. - Continue ASA and statin No b-blocker with shock - He will need cath prior to discharge but would hold until we see if kidneys recover, or if transitions to HD.   3. AKI/ESRD with uremia - likely due to ATN and hypoperfusion. Initial BUN/CR 198/3.91 - Baseline creatinine normal several months ago -  Renal following CRRT started on 1/25 - Renal u/s 1/26: Mildly echogenic right kidney, compatible with chronic medical renal disease. - Now had tunneled HD cath. Pulling 100 mls/hr.   4. Microcytic anemia due to IDA - Has received 2u RBCS 1/26. Repeat transfusion for hgb <= 7.5 - Hgb 9.2 -> 8.2 -> 8.6 -> 7.9. Likely from CRRT circuit blood loss. Unable to rinse back x3. Received 2 uPRBC 2/5 -> 9.8 today. - Smear without schistocytes - Iron stores low. Has received Feraheme - Will eventually need GI w/u - Continue protonix  5. Shock liver - Improving. No change.   6. VT - Quiescent on amiodarone gtt and PO mexilitene. No further VT - Keep K > 4.0 Mg > 2.0. K 4.4, mag 2.5  7. L wrist pain - Suspect gout. Improving with steroids and colchicine. - Uric acid normal at 3.2. Continues to improve.   8. Leukocytosis - likely due to steroids. Started to wean. Cultures NGTD x 24 hours  - Now on Vanc/Cefepime. WBC trending down 10.8. Afebrile.    9. Hyponatremia - Na 134  10. Increase O2 requirement - ?aspiration. Check CXR. Also a question if O2 was fully connected.  - Increase from 2L to 5L. Sats low 90s.   Length of Stay: [place], NP  08/09/2018, 9:15 AM  Advanced Heart Failure Team Pager [phone] (M-F; 7a - 4p)  Please contact [facility] for night-coverage after hours (4p -7a ) and weekends on [email or web address]  Agree with above.   Remains on dual pressors. Co-ox improved. CVP 12. On midodrine 10  tid. Remains anuric but doing well with CRRT. Sepsis-like state improving on abx.   CVP 11-12 Cor RRR Lungs CTA Ab soft NT Ext no edema   Hemodynamics improved on dual pressors. Unfortunately still no sign of renal recovery. Will d/w Renal what time line is for waiting. Will hold off on cardiac cath for now. Wean NE as tolerated. Can change amio to 200 bid.   CRITICAL CARE Performed by: Glori Bickers  Total critical care time: 35 minutes  Critical care time was exclusive of separately billable procedures and treating other patients.  Critical care was necessary to treat or prevent imminent or life-threatening deterioration.  Critical care was time spent personally by me (independent of midlevel providers or residents) on the following activities: development of treatment plan with patient and/or surrogate as well as nursing, discussions with consultants, evaluation of patient's response to treatment, examination of patient, obtaining history from patient or surrogate, ordering and performing treatments and interventions, ordering and review of laboratory studies, ordering and review of radiographic studies, pulse oximetry and re-evaluation of patient's condition.  Glori Bickers, MD  11:00 AM

## 2018-08-09 NOTE — Plan of Care (Signed)
  Problem: Clinical Measurements: Goal: Cardiovascular complication will be avoided Outcome: Not Progressing

## 2018-08-10 ENCOUNTER — Inpatient Hospital Stay (HOSPITAL_COMMUNITY): Payer: Medicaid Other

## 2018-08-10 LAB — POCT ACTIVATED CLOTTING TIME
ACTIVATED CLOTTING TIME: 180 s
Activated Clotting Time: 197 seconds
Activated Clotting Time: 164 seconds
Activated Clotting Time: 153 seconds
Activated Clotting Time: 169 seconds
Activated Clotting Time: 164 seconds

## 2018-08-10 LAB — RENAL FUNCTION PANEL
ALBUMIN: 2.7 g/dL — AB (ref 3.5–5.0)
Anion gap: 8 (ref 5–15)
BUN: 19 mg/dL (ref 8–23)
CO2: 25 mmol/L (ref 22–32)
Calcium: 7.9 mg/dL — ABNORMAL LOW (ref 8.9–10.3)
Chloride: 101 mmol/L (ref 98–111)
Creatinine, Ser: 2.47 mg/dL — ABNORMAL HIGH (ref 0.61–1.24)
GFR calc Af Amer: 31 mL/min — ABNORMAL LOW (ref >60)
GFR calc non Af Amer: 27 mL/min — ABNORMAL LOW (ref >60)
Glucose, Bld: 107 mg/dL — ABNORMAL HIGH (ref 70–99)
Phosphorus: 1.6 mg/dL — ABNORMAL LOW (ref 2.5–4.6)
Potassium: 4.5 mmol/L (ref 3.5–5.1)
Sodium: 134 mmol/L — ABNORMAL LOW (ref 135–145)

## 2018-08-10 LAB — APTT: aPTT: 75 seconds — ABNORMAL HIGH (ref 24–36)

## 2018-08-10 LAB — CBC
HCT: 33.9 % — ABNORMAL LOW (ref 39.0–52.0)
Hemoglobin: 10.1 g/dL — ABNORMAL LOW (ref 13.0–17.0)
MCH: 22.9 pg — ABNORMAL LOW (ref 26.0–34.0)
MCHC: 29.8 g/dL — ABNORMAL LOW (ref 30.0–36.0)
MCV: 76.9 fL — AB (ref 80.0–100.0)
NRBC: 29.8 % — AB (ref 0.0–0.2)
PLATELETS: 114 10*3/uL — AB (ref 150–400)
RBC: 4.41 MIL/uL (ref 4.22–5.81)
RDW: 29.4 % — ABNORMAL HIGH (ref 11.5–15.5)
WBC: 12.9 10*3/uL — ABNORMAL HIGH (ref 4.0–10.5)

## 2018-08-10 LAB — COOXEMETRY PANEL
Carboxyhemoglobin: 1.8 % — ABNORMAL HIGH (ref 0.5–1.5)
Methemoglobin: 1.7 % — ABNORMAL HIGH (ref 0.0–1.5)
O2 Saturation: 74.4 %
Total hemoglobin: 10.5 g/dL — ABNORMAL LOW (ref 12.0–16.0)

## 2018-08-10 LAB — MAGNESIUM: Magnesium: 2.7 mg/dL — ABNORMAL HIGH (ref 1.7–2.4)

## 2018-08-10 LAB — SEDIMENTATION RATE: Sed Rate: 9 mm/hr (ref 0–16)

## 2018-08-10 MED ORDER — MILRINONE LACTATE IN DEXTROSE 20-5 MG/100ML-% IV SOLN: 0.1250 ug/kg/min | INTRAVENOUS | Status: DC

## 2018-08-10 NOTE — Progress Notes (Signed)
[facility]    NEPHROLOGY PROGRESS NOTE  SUBJECTIVE: Reports feeling significantly better today.  Denies chest pain or shortness of breath.  Denies nausea, vomiting, diarrhea or dysuria.  Has lost approximately 30 pounds total.  Levophed dose slightly increased.  No other acute events.    OBJECTIVE:  Vitals:   08/10/18 1215 08/10/18 1230  BP: (!) 89/65 90/72  Pulse: 94 94  Resp: (!) 23 17  Temp:    SpO2: 96% 97%    Intake/Output Summary (Last 24 hours) at 08/10/2018 1303 Last data filed at 08/10/2018 1200 Gross per 24 hour  Intake 2182.21 ml  Output 6466 ml  Net -4283.79 ml      Gen[place]l:  AAOx3 NAD HEENT: MMM [place] AT anicteric sclera Neck:  No JVD, no adenopathy CV:  Heart RRR  Lungs:  L/S CTA bilaterally Abd:  abd SNT/ND with normal BS GU:  Bladder non-palpable Extremities:  No LE edema. Skin:  No skin rash  MEDICATIONS:   Current Facility-Administered Medications:  .  0.9 %  sodium chloride infusion (Manually program via Guardrails IV Fluids), , Intravenous, Once, Georgiana Shore, NP .  Place/Maintain arterial line, , , Until Discontinued **AND** 0.9 %  sodium chloride infusion, , Intra-arterial, PRN, Rush Farmer, MD .  0.9 %  sodium chloride infusion, , Intravenous, PRN, Bensimhon, Shaune Pascal, MD, Stopped at 08/08/18 1126 .  acetaminophen (TYLENOL) tablet 650 mg, 650 mg, Oral, Q4H PRN, Bensimhon, Shaune Pascal, MD .  amiodarone (PACERONE) tablet 200 mg, 200 mg, Oral, BID, Bensimhon, Shaune Pascal, MD, 200 mg at 08/10/18 1041 .  aspirin EC tablet 81 mg, 81 mg, Oral, Daily, Bensimhon, Shaune Pascal, MD, 81 mg at 08/10/18 1041 .  atorvastatin (LIPITOR) tablet 80 mg, 80 mg, Oral, q1800, Lelon Perla, MD, 80 mg at 08/09/18 1712 .  ceFEPIme (MAXIPIME) 2 g in sodium chloride 0.9 % 100 mL IVPB, 2 g, Intravenous, Q12H, Einar Grad, RPH, Stopped at 08/10/18 1108 .  chlorhexidine (PERIDEX) 0.12 % solution 15 mL, 15 mL, Mouth Rinse, BID, Bensimhon, Shaune Pascal, MD, 15 mL  at 08/10/18 1000 .  Chlorhexidine Gluconate Cloth 2 % PADS 6 each, 6 each, Topical, Daily, Bensimhon, Shaune Pascal, MD, 6 each at 08/10/18 [phone] .  Darbepoetin Alfa (ARANESP) injection 200 mcg, 200 mcg, Intravenous, Q Thu-HD, Crenshaw, Denice Bors, MD, 200 mcg at 08/09/18 2157 .  heparin 10,000 units/ 20 mL infusion syringe, 250-3,000 Units/hr, CRRT, Continuous, Bensimhon, Shaune Pascal, MD, Stopped at 08/10/18 1150 .  heparin bolus via infusion syringe 1,000 Units, 1,000 Units, CRRT, PRN, Bensimhon, Shaune Pascal, MD, 1,000 Units at 08/07/18 1559 .  heparin injection 1,000-6,000 Units, 1,000-6,000 Units, CRRT, PRN, Bensimhon, Shaune Pascal, MD, 3,600 Units at 08/10/18 1201 .  MEDLINE mouth rinse, 15 mL, Mouth Rinse, BID, Bensimhon, Shaune Pascal, MD, 15 mL at 08/08/18 1000 .  mexiletine (MEXITIL) capsule 150 mg, 150 mg, Oral, Q8H, Crenshaw, Denice Bors, MD, 150 mg at 08/10/18 0507 .  midodrine (PROAMATINE) tablet 10 mg, 10 mg, Oral, TID WC, Georgiana Shore, NP, 10 mg at 08/10/18 1211 .  milrinone (PRIMACOR) 20 MG/100 ML (0.2 mg/mL) infusion, 0.125 mcg/kg/min, Intravenous, Continuous, Bensimhon, Shaune Pascal, MD, Last Rate: 2.58 mL/hr at 08/10/18 0852, 0.125 mcg/kg/min at 08/10/18 0852 .  nitroGLYCERIN (NITROSTAT) SL tablet 0.4 mg, 0.4 mg, Sublingual, Q5 Min x 3 PRN, Bensimhon, Shaune Pascal, MD .  norepinephrine (LEVOPHED) 16 mg in 217mL premix infusion, 0-40 mcg/min, Intravenous, Titrated, Crenshaw, Denice Bors, MD, Last  Rate: 15 mL/hr at 08/10/18 1200, 16 mcg/min at 08/10/18 1200 .  ondansetron (ZOFRAN) injection 4 mg, 4 mg, Intravenous, Q6H PRN, Bensimhon, Shaune Pascal, MD, 4 mg at 08/08/18 1424 .  pantoprazole (PROTONIX) EC tablet 40 mg, 40 mg, Oral, BID, Ledell Noss, MD, 40 mg at 08/10/18 1041 .  prismasol BGK 4/2.5 infusion, , CRRT, Continuous, Rosita Fire, MD, Last Rate: 2,000 mL/hr at 08/10/18 6767 .  sodium chloride 0.9 % primer fluid for CRRT, , CRRT, PRN, Bensimhon, Shaune Pascal, MD .  sodium chloride flush (NS) 0.9 % injection 3  mL, 3 mL, Intravenous, Q12H, Bensimhon, Shaune Pascal, MD, 3 mL at 08/10/18 1041 .  sodium chloride flush (NS) 0.9 % injection 3 mL, 3 mL, Intravenous, PRN, Bensimhon, Shaune Pascal, MD     LABS:   CBC Latest Ref Rng & Units 08/10/2018 08/09/2018 08/08/2018  WBC 4.0 - 10.5 K/uL 12.9(H) 10.8(H) 13.1(H)  Hemoglobin 13.0 - 17.0 g/dL 10.1(L) 9.8(L) 9.3(L)  Hematocrit 39.0 - 52.0 % 33.9(L) 31.5(L) 30.8(L)  Platelets 150 - 400 K/uL 114(L) 99(L) 90(L)    CMP Latest Ref Rng & Units 08/10/2018 08/09/2018 08/09/2018  Glucose 70 - 99 mg/dL 107(H) 125(H) 112(H)  BUN 8 - 23 mg/dL 19 21 21   Creatinine 0.61 - 1.24 mg/dL 2.47(H) 2.49(H) 2.59(H)  Sodium 135 - 145 mmol/L 134(L) 135 134(L)  Potassium 3.5 - 5.1 mmol/L 4.5 4.9 4.4  Chloride 98 - 111 mmol/L 101 99 99  CO2 22 - 32 mmol/L 25 24 25   Calcium 8.9 - 10.3 mg/dL 7.9(L) 8.0(L) 7.7(L)  Total Protein 6.5 - 8.1 g/dL - - -  Total Bilirubin 0.3 - 1.2 mg/dL - - -  Alkaline Phos 38 - 126 U/L - - -  AST 15 - 41 U/L - - -  ALT 0 - 44 U/L - - -    Lab Results  Component Value Date   CALCIUM 7.9 (L) 08/10/2018   CAION 1.00 (L) 07/19/2018   CAION 1.06 (L) 07/11/2018   PHOS 1.6 (L) 08/10/2018       Component Value Date/Time   COLORURINE YELLOW 07/17/2018 2208   APPEARANCEUR HAZY (A) 07/20/2018 2208   LABSPEC 1.013 07/08/2018 2208   PHURINE 5.0 07/29/2018 2208   GLUCOSEU NEGATIVE 07/04/2018 2208   HGBUR NEGATIVE 07/31/2018 2208   BILIRUBINUR NEGATIVE 07/30/2018 2208   KETONESUR NEGATIVE 08/02/2018 2208   PROTEINUR NEGATIVE 07/31/2018 2208   NITRITE NEGATIVE 07/10/2018 2208   LEUKOCYTESUR NEGATIVE 07/30/2018 2208      Component Value Date/Time   HCO3 24.0 07/23/2018 0908   HCO3 25.7 07/13/2018 0908   TCO2 25 07/15/2018 0908   TCO2 27 08/02/2018 0908   ACIDBASEDEF 1.0 07/08/2018 0908   O2SAT 74.4 08/10/2018 0428       Component Value Date/Time   IRON 14 (L) 07/04/2018 1651   TIBC 458 (H) 07/20/2018 1651   FERRITIN 28 07/04/2018 1651   IRONPCTSAT 3 (L)  08/03/2018 1651       ASSESSMENT/PLAN:       Acute systolic heart failure ejection fraction 15% severe RV dysfunction with cardiogenic shock. CVPs up [place] 1/30 w/ high PCW 25.  Still requiring levophed (dose increased) and milrinone.  Has had multiple episodes of clotting the circuit.  Will discontinue CRRT with the circuit clotting again today.  S/p tunneled HD cath 2/4, appreciate IR.    Will transition to intermittent hemodialysis tomorrow.   Coronary artery disease continues aspirin and statin will need cardiac cath prior to  discharge- holding for now in case renal function improves   Acute kidney injurycrt 1.34 in August 2019secondary to acute tubular necrosis and hypoperfusion admission creatinine 3.91.Echogenic right kidney compatible medical renal disease. No urine output.  Will transition to CRRT.   Anemia- havestarteddarbepoetin. Completed RBC transfusion 07/29/2018 and again 2/4   Hypertension/volume. Blood pressure is now low.  Physical exam at substantially improved, with no edema and skin tenting noted.  Chest x-ray has improved.  Will transition to intermittent hemodialysis.   Leukocytosis- climbing on steroids, cultures drawn and neg, abx, possible aspiration   VT: on amio and mexilitine    [employer], DO, FACP

## 2018-08-10 NOTE — Progress Notes (Signed)
After turning and bathing pt in bed, pt began to c/o 2/10 cramping chest pain.  Pt was pulled up and repositioned in the bed and, per pt, the pain went away. Will monitor closely.

## 2018-08-10 NOTE — Progress Notes (Signed)
Pharmacy Antibiotic Note  Samuel Bates is a 63 y.o. male admitted on 07/13/2018 with cardiogenic shock s/p MI at [facility].  Currently on milrinone and norepinephrine for cardiac output and BP support and CRT for AKI .  Pharmacy has been consulted to restart vancomycin and cefepime dosing in setting of in wbc central lines and possible infection.  CRRT now running adequately after replacing lines. Pt has increased O2 requirement likely 2/2 volume, WBC up form steroids, cultures negative. Will stop vancomycin and continue cefepime for now after discussion with MD.  Plan: -Continue cefepime 2g IV q12h  -Stop vancomycin -Monitor CRRT, clinical pic, cx results, and levels as appropriate    Height: 6\' 3"  (190.5 cm) Weight: 151 lb 14.4 oz (68.9 kg) IBW/kg (Calculated) : 84.5  Temp (24hrs), Avg:97.8 F (36.6 C), Min:97.2 F (36.2 C), Max:98.2 F (36.8 C)  Recent Labs  Lab 08/06/18 0435  08/07/18 0458  08/08/18 0427 08/08/18 1524 08/09/18 0556 08/09/18 1731 08/10/18 0429  WBC 16.2*  --  17.3*  --  13.1*  --  10.8*  --  12.9*  CREATININE 2.92*   < >  --    < > 3.95* 3.16* 2.59* 2.49* 2.47*  VANCORANDOM  --   --   --   --   --  10  --   --   --    < > = values in this interval not displayed.    Estimated Creatinine Clearance: 30.2 mL/min (A) (by C-G formula based on SCr of 2.47 mg/dL (H)).    Allergies  Allergen Reactions  . Penicillins Hives    Tolerated cefepime 1/20 Did it involve swelling of the face/tongue/throat, SOB, or low BP? YES Did it involve sudden or severe rash/hives, skin peeling, or any reaction on the inside of your mouth or nose? NO Did you need to seek medical attention at a hospital or doctor's office? YES When did it last happen? 30 years ago If all above answers are "NO", may proceed with cephalosporin use.  . Simvastatin Other (See Comments)    insomnia    Antimicrobials: Vancomycin 1/25 >>1/26; 2/2 >>2/7 Cefepime 1/25 x1; 2/2 >>  Antibiotic Regimen  Adjustments:  VR 2/5 10: resume vancomycin 750 mg IV every 24 hours  Microbiology: 1/25 BCx: neg 2/1 BCx: ngtd  Antonietta Jewel, PharmD, [facility] Clinical Pharmacist  Pager: [phone] Phone: [phone] Please check AMION for all [place] numbers 08/10/2018

## 2018-08-10 NOTE — Progress Notes (Signed)
   08/10/18 1150  Treatment  CRRT Status Off/Blood returned   Trans-membranous pressures continuing to increase. CRRT stopped and blood returned to patient with no complications. HD catheter, flushed, heparin locked, and clamped. Dr. Grayland Ormond notified and stated we will not continue CRRT at this time. Will continue to monitor closely.

## 2018-08-10 NOTE — Progress Notes (Addendum)
Advanced Heart Failure Rounding Note  PCP-Cardiologist: No primary care provider on file.   Subjective:    NE @ 16. Remains on milrinone 0.25. On midodrine 10 mg TID. SBP 80-90s. Coox 74%.  Had increased O2 requirement yesterday and CXR showed CHF. Increased CRRT volume removal. Now on HFNC 8 (10 yesterday). Weight down 11 more lbs (nearly 30 lbs total). CVP not currently working, but Therapist, sports got 16 overnight. Pulling 200 mls/hr on CRRT. Anuric.  Now on PO amio and mexilitine. No VT.   On empiric Vanc/Cefepime. WBC 16.2 -> 17.3 -> 13.1 -> 10.8 -> 12.9. Blood cx 2/1 NGTD. Afebrile. Last dose of steroids yesterday.   Received 2 uPRBCs 2/4. Hemoglobin stable 10.1  Denies SOB. Did not get OOB yesterday, but planning circuit change today and wants to walk. Had an episode of CP when lying flat, which improved with sitting up and burping.  Echo 07/19/2018 LVEF 15%, with severe RV failure.   RHC 08/02/2018 On milrinone 0.125 and NE 14 RA = 13 RV = 51/18 PA = 50/24 (34) PCW = 25 (v = 35) Fick cardiac output/index = 5.42/2.59 Thermo CO/CI = 5.49/2.63 PVR = 1.6 WU FA sat = 97% PA sat = 54%, 58%  Objective:   Weight Range: 68.9 kg Body mass index is 18.99 kg/m.   Vital Signs:   Temp:  [97.2 F (36.2 C)-98.2 F (36.8 C)] 98.1 F (36.7 C) (02/07 0715) Pulse Rate:  [82-96] 91 (02/07 0745) Resp:  [11-28] 18 (02/07 0745) BP: (80-106)/(60-79) 96/69 (02/07 0745) SpO2:  [86 %-100 %] 90 % (02/07 0745) Weight:  [68.9 kg] 68.9 kg (02/07 0615) Last BM Date: 08/09/18  Weight change: Filed Weights   08/08/18 0500 08/09/18 0615 08/10/18 0615  Weight: 76.1 kg 73.8 kg 68.9 kg   Intake/Output:   Intake/Output Summary (Last 24 hours) at 08/10/2018 0814 Last data filed at 08/10/2018 0800 Gross per 24 hour  Intake 2079.43 ml  Output 6970 ml  Net -4890.57 ml    Physical Exam    General: No resp difficulty. HEENT: Normal Neck: Supple. JVP does not appear elevated. Carotids 2+ bilat; no  bruits. No thyromegaly or nodule noted. Left IJ TLC. Right [place] tunneled HD cath.  Cor: PMI nondisplaced. RRR, +s3 Lungs: CTAB, normal effort. Abdomen: Soft, non-tender, non-distended, no HSM. No bruits or masses. +BS  Extremities: No cyanosis, clubbing, or rash. R and LLE no edema.  Neuro: Alert & orientedx3, cranial nerves grossly intact. moves all 4 extremities w/o difficulty. Affect pleasant  Telemetry   NSR 80-90s. No VT. Personally reviewed.   Labs    CBC Recent Labs    08/09/18 0556 08/10/18 0429  WBC 10.8* 12.9*  HGB 9.8* 10.1*  HCT 31.5* 33.9*  MCV 77.2* 76.9*  PLT 99* 244*   Basic Metabolic Panel Recent Labs    08/09/18 0556 08/09/18 1731 08/10/18 0429  NA 134* 135 134*  K 4.4 4.9 4.5  CL 99 99 101  CO2 '25 24 25  ' GLUCOSE 112* 125* 107*  BUN '21 21 19  ' CREATININE 2.59* 2.49* 2.47*  CALCIUM 7.7* 8.0* 7.9*  MG 2.5*  --  2.7*  PHOS 2.3* 2.5 1.6*   Liver Function Tests Recent Labs    08/09/18 1731 08/10/18 0429  ALBUMIN 2.7* 2.7*   No results for input(s): LIPASE, AMYLASE in the last 72 hours. Cardiac Enzymes No results for input(s): CKTOTAL, CKMB, CKMBINDEX, TROPONINI in the last 72 hours.  BNP: BNP (last 3 results) Recent  Labs    07/18/2018 1651  BNP 4,188.4*    ProBNP (last 3 results) No results for input(s): PROBNP in the last 8760 hours.   D-Dimer No results for input(s): DDIMER in the last 72 hours. Hemoglobin A1C No results for input(s): HGBA1C in the last 72 hours. Fasting Lipid Panel No results for input(s): CHOL, HDL, LDLCALC, TRIG, CHOLHDL, LDLDIRECT in the last 72 hours. Thyroid Function Tests No results for input(s): TSH, T4TOTAL, T3FREE, THYROIDAB in the last 72 hours.  Invalid input(s): FREET3  Other results:   Imaging    Dg Chest Port 1 View  Result Date: 08/09/2018 CLINICAL DATA:  Hypoxia, history of hypertension, dialysis. EXAM: PORTABLE CHEST 1 VIEW COMPARISON:  Chest x-rays dated 08/03/2018 and 07/14/2018.  FINDINGS: Stable cardiomegaly. RIGHT-sided dialysis catheter in place with tip adequately positioned at the level of the lower SVC. LEFT IJ central line has been repositioned with tip now at the level of the upper SVC. Increased central pulmonary vascular congestion and bilateral interstitial edema. No pleural effusion or pneumothorax seen. Osseous structures about the chest are unremarkable. [place] IMPRESSION: 1. Cardiomegaly with central pulmonary vascular congestion and bilateral interstitial edema indicating CHF/volume overload. 2. LEFT IJ central line has been repositioned with tip now at the level of the upper SVC. Electronically Signed   By: Franki Cabot M.D.   On: 08/09/2018 10:40     Medications:     Scheduled Medications: . sodium chloride   Intravenous Once  . amiodarone  200 mg Oral BID  . aspirin EC  81 mg Oral Daily  . atorvastatin  80 mg Oral q1800  . chlorhexidine  15 mL Mouth Rinse BID  . Chlorhexidine Gluconate Cloth  6 each Topical Daily  . darbepoetin (ARANESP) injection - DIALYSIS  200 mcg Intravenous Q Thu-HD  . mouth rinse  15 mL Mouth Rinse BID  . mexiletine  150 mg Oral Q8H  . midodrine  10 mg Oral TID WC  . pantoprazole  40 mg Oral BID  . sodium chloride flush  3 mL Intravenous Q12H    Infusions: .  prismasol BGK 4/2.5 400 mL/hr at 08/10/18 0725  .  prismasol BGK 4/2.5 200 mL/hr at 08/09/18 1842  . sodium chloride    . sodium chloride Stopped (08/08/18 1126)  . ceFEPime (MAXIPIME) IV Stopped (08/09/18 2139)  . heparin 10,000 units/ 20 mL infusion syringe 1,900 Units/hr (08/10/18 0808)  . milrinone 0.25 mcg/kg/min (08/10/18 0800)  . norepinephrine (LEVOPHED) Adult infusion 16 mcg/min (08/10/18 0803)  . prismasol BGK 4/2.5 2,000 mL/hr at 08/10/18 0653  . vancomycin Stopped (08/09/18 1910)    PRN Medications: Place/Maintain arterial line **AND** sodium chloride, sodium chloride, acetaminophen, heparin, heparin, nitroGLYCERIN, ondansetron (ZOFRAN) IV,  sodium chloride, sodium chloride flush   Patient Profile   62 y.o.malewith past medical history of hypertension, hyperlipidemia admitted with recent but late presenting inferior lateral myocardial infarction, cardiogenic shock, acute renal failure and microcytic anemia. Echocardiogram showed severe LV and RV dysfunction and moderate mitral regurgitation.  Assessment/Plan   1. Acute systolic HF with cardiogenic shock due to OOH MI - Echo 1/5 EF 15% severe RV dysfunction and moderate MR - Initial co-ox 28% but this was drawn femorally so inaccurate.  - Coox 74% (drawn from trialysis cath) on milrinone 0.25 mcg/kg/min and norepi. NE @ 16. SBP 90s - CVP 16 overnight. CVP setup being changed this am. Down 30 lbs total, 11 lbs overnight. - Continue midodrine 10 mg TID. - Volume managed with CRRT.  Now had tunneled HD cath. Pulling 200 mls/hr now.  - No ACE/ARB/ARNI or b-blocker with shock and AKI - Last dose of steroids 2/6  2. CAD - s/p OOH in inferolateral MI - Initial trop 28, ten trended down.   - No s/s ischemia.  - Continue ASA and statin No b-blocker with shock - Hold off on cath for now.   3. AKI/ESRD with uremia - likely due to ATN and hypoperfusion. Initial BUN/CR 198/3.91 - Baseline creatinine normal several months ago - Renal following CRRT started on 1/25 - Renal u/s 1/26: Mildly echogenic right kidney, compatible with chronic medical renal disease. - Now had tunneled HD cath. Pulling 200 mls/hr. Anuric.   4. Acute hypoxic respiratory failure - CXR 2/6: bilateral interstitial edema - Now on 8L HFNC. Does not feel SOB. Hopefully will improve with volume removal.  5. Microcytic anemia due to IDA - Has received 2u RBCS 1/26. Repeat transfusion for hgb <= 7.5 - Hgb 9.2 -> 8.2 -> 8.6 -> 7.9. Likely from CRRT circuit blood loss. Unable to rinse back x3. Received 2 uPRBC 2/5. Hemoglobin 10.1 today.  - Smear without schistocytes - Iron stores low. Has received Feraheme -  Will eventually need GI w/u - Continue protonix  6. Shock liver - Improving. No change.   7. VT - Quiescent on amiodarone gtt and PO mexilitene. No further VT.  - Keep K > 4.0 Mg > 2.0. K 4.4, mag 2.5  8. L wrist pain - Suspect gout. Improving with steroids and colchicine. - Uric acid normal at 3.2. Continues to improve.   9. Leukocytosis - likely due to steroids. Now off. Cultures NGTD x 24 hours  - Now on Vanc/Cefepime. WBC trending back up 10.8 -> 12.9. Afebrile. Last dose of steroids yesterday. May be able to stop antibiotics soon.    10. Hyponatremia - Na 134   Length of Stay: [place], NP  08/10/2018, 8:14 AM  Advanced Heart Failure Team Pager [phone] (M-F; 7a - 4p)  Please contact [facility] for night-coverage after hours (4p -7a ) and weekends on [email or web address]  Agree with above.   Remains on NE and milrinone. SBP in the 90s. Now on midodrine 10 tid. Yesterday developed acute respiratory distress with pulmonary edema on CXR. UF rate increased to 200 with 11 pounds off overnight. Breathing better now but still on 8L. F/u CXR pending. No CP or VT. Now on po amio.   Lying in bed  On high flow O2 NAD JVP up Cor RRR tunneled HD cath Lungs minimal crackles Ab soft NT Ext no edema  Tele: NSR no VT  He remains on dual pressors with SBP in 90s. Co-ox much improved. Will begin to wean milrinone to see if we get more BP to support NE wean. Still anuric (creatinine was normal at baseline prior to MI). Strive to keep SBP >= 90 to maximize chance of renal recovery. Suspect acute respiratory distress likely due to flash pulmonary edema but acute amio pneumonitis also a possibility. Continue to pull with HD. F/u CXR today. Check ESR. Can stop vancomycin. Continue cefimpime for now. No cath yet given AKI.   Plan for today: 1) continue UF 2) f/u CXR and check ESR 3) wean milrinone as tolerated.   CRITICAL CARE Performed by: Glori Bickers  Total critical care  time: 35 minutes  Critical care time was exclusive of separately billable procedures and treating other patients.  Critical care was necessary to treat or  prevent imminent or life-threatening deterioration.  Critical care was time spent personally by me (independent of midlevel providers or residents) on the following activities: development of treatment plan with patient and/or surrogate as well as nursing, discussions with consultants, evaluation of patient's response to treatment, examination of patient, obtaining history from patient or surrogate, ordering and performing treatments and interventions, ordering and review of laboratory studies, ordering and review of radiographic studies, pulse oximetry and re-evaluation of patient's condition.  Glori Bickers, MD  8:46 AM

## 2018-08-11 LAB — RENAL FUNCTION PANEL
Albumin: 2.4 g/dL — ABNORMAL LOW (ref 3.5–5.0)
Anion gap: 12 (ref 5–15)
BUN: 38 mg/dL — ABNORMAL HIGH (ref 8–23)
CO2: 20 mmol/L — ABNORMAL LOW (ref 22–32)
Calcium: 7.7 mg/dL — ABNORMAL LOW (ref 8.9–10.3)
Chloride: 97 mmol/L — ABNORMAL LOW (ref 98–111)
Creatinine, Ser: 4.22 mg/dL — ABNORMAL HIGH (ref 0.61–1.24)
GFR calc Af Amer: 16 mL/min — ABNORMAL LOW (ref >60)
GFR calc non Af Amer: 14 mL/min — ABNORMAL LOW (ref >60)
Glucose, Bld: 95 mg/dL (ref 70–99)
Phosphorus: 2.5 mg/dL (ref 2.5–4.6)
Potassium: 5 mmol/L (ref 3.5–5.1)
SODIUM: 129 mmol/L — AB (ref 135–145)

## 2018-08-11 LAB — CBC
HCT: 32 % — ABNORMAL LOW (ref 39.0–52.0)
Hemoglobin: 9.6 g/dL — ABNORMAL LOW (ref 13.0–17.0)
MCH: 23 pg — ABNORMAL LOW (ref 26.0–34.0)
MCHC: 30 g/dL (ref 30.0–36.0)
MCV: 76.6 fL — ABNORMAL LOW (ref 80.0–100.0)
PLATELETS: 110 10*3/uL — AB (ref 150–400)
RBC: 4.18 MIL/uL — ABNORMAL LOW (ref 4.22–5.81)
RDW: 29.4 % — ABNORMAL HIGH (ref 11.5–15.5)
WBC: 11.4 10*3/uL — AB (ref 4.0–10.5)
nRBC: 33.7 % — ABNORMAL HIGH (ref 0.0–0.2)

## 2018-08-11 LAB — MAGNESIUM: Magnesium: 2.5 mg/dL — ABNORMAL HIGH (ref 1.7–2.4)

## 2018-08-11 LAB — COOXEMETRY PANEL
Carboxyhemoglobin: 2.3 % — ABNORMAL HIGH (ref 0.5–1.5)
Methemoglobin: 1.8 % — ABNORMAL HIGH (ref 0.0–1.5)
O2 Saturation: 89.5 %
Total hemoglobin: 10.5 g/dL — ABNORMAL LOW (ref 12.0–16.0)

## 2018-08-11 LAB — APTT: aPTT: 42 seconds — ABNORMAL HIGH (ref 24–36)

## 2018-08-11 MED ORDER — ALTEPLASE 2 MG IJ SOLR
INTRAMUSCULAR | Status: AC
  Administered 2018-08-11: 2 mg
  Filled 2018-08-11: qty 4

## 2018-08-11 MED ORDER — ALTEPLASE 2 MG IJ SOLR
2.0000 mg | Freq: Once | INTRAMUSCULAR | Status: AC
  Administered 2018-08-12: 2 mg
  Filled 2018-08-11: qty 2

## 2018-08-11 MED ORDER — SODIUM CHLORIDE 0.9 % IV SOLN
1.0000 g | INTRAVENOUS | Status: DC
  Administered 2018-08-11: 1 g via INTRAVENOUS
  Filled 2018-08-11: qty 1

## 2018-08-11 MED ORDER — MAGIC MOUTHWASH
10.0000 mL | Freq: Three times a day (TID) | ORAL | Status: DC | PRN
  Administered 2018-08-11 – 2018-08-16 (×3): 10 mL via ORAL
  Filled 2018-08-11 (×5): qty 10

## 2018-08-11 MED ORDER — SODIUM CHLORIDE 0.9 % IV SOLN: 100.0000 mL | INTRAVENOUS | Status: DC | PRN

## 2018-08-11 MED ORDER — HEPARIN SODIUM (PORCINE) 1000 UNIT/ML DIALYSIS
1000.0000 [IU] | INTRAMUSCULAR | Status: DC | PRN
  Filled 2018-08-11 (×2): qty 1

## 2018-08-11 MED ORDER — ALTEPLASE 2 MG IJ SOLR
2.0000 mg | Freq: Once | INTRAMUSCULAR | Status: AC | PRN
  Administered 2018-08-11: 2 mg

## 2018-08-11 NOTE — Progress Notes (Signed)
CVC line not drawing blood back for lab draws, Aundra Dubin MD aware. IV team unable to place PICC line due to pt being an HD patient.

## 2018-08-11 NOTE — Progress Notes (Signed)
Venous Co-Ox ordered. Triple Lumen CVC in place; however, none of the lumens will return blood. MD aware of sluggish blood return during the previous days and difficulty obtaining CVP measurements. AM labs collected via peripheral stick, as well as Venous Co-Ox. MD to be made aware during AM rounds that result of 89.5 from venous co-ox is related to location of specimen collection (peripheral vein right forearm).

## 2018-08-11 NOTE — Progress Notes (Signed)
Pharmacy Antibiotic Note  Samuel Bates is a 63 y.o. male admitted on 07/16/2018 with cardiogenic shock s/p MI at [facility].    Pharmacy has been consulted for cefepime dosing in setting of HCAP  CRRT stopped last night.  Planning to transition to iHD  Plan: -Change cefepime to 1g q 24 hrs for now. -Can dose with each HD once schedule determined. -Monitor CRRT, clinical pic, cx results, and levels as appropriate    Height: 6\' 3"  (190.5 cm) Weight: 158 lb 4.6 oz (71.8 kg) IBW/kg (Calculated) : 84.5  Temp (24hrs), Avg:97.9 F (36.6 C), Min:97.6 F (36.4 C), Max:98.3 F (36.8 C)  Recent Labs  Lab 08/07/18 0458  08/08/18 0427 08/08/18 1524 08/09/18 0556 08/09/18 1731 08/10/18 0429 08/11/18 0454  WBC 17.3*  --  13.1*  --  10.8*  --  12.9* 11.4*  CREATININE  --    < > 3.95* 3.16* 2.59* 2.49* 2.47* 4.22*  VANCORANDOM  --   --   --  10  --   --   --   --    < > = values in this interval not displayed.    Estimated Creatinine Clearance: 18.4 mL/min (A) (by C-G formula based on SCr of 4.22 mg/dL (H)).    Allergies  Allergen Reactions  . Penicillins Hives    Tolerated cefepime 1/20 Did it involve swelling of the face/tongue/throat, SOB, or low BP? YES Did it involve sudden or severe rash/hives, skin peeling, or any reaction on the inside of your mouth or nose? NO Did you need to seek medical attention at a hospital or doctor's office? YES When did it last happen? 30 years ago If all above answers are "NO", may proceed with cephalosporin use.  . Simvastatin Other (See Comments)    insomnia    Antimicrobials this admission:  Vancomycin 1/25 >>1/26; 2/2 >>2/7 Cefepime 1/25 x1; 2/2 >>  Dose adjustments this admission:  *VR 10 > redose 750mg  IV q24h  Microbiology results:  1/25 BCx: neg 2/1 BCx: neg  Marguerite Olea, [facility] Clinical Pharmacist Phone [phone]  08/11/2018 12:45 PM

## 2018-08-11 NOTE — Plan of Care (Signed)
  Problem: Health Behavior/Discharge Planning: Goal: Ability to manage health-related needs will improve Outcome: Progressing   Problem: Clinical Measurements: Goal: Ability to maintain clinical measurements within normal limits will improve Outcome: Progressing Goal: Will remain free from infection Outcome: Progressing Goal: Diagnostic test results will improve Outcome: Progressing Goal: Respiratory complications will improve Outcome: Progressing Goal: Cardiovascular complication will be avoided Outcome: Progressing   Problem: Activity: Goal: Risk for activity intolerance will decrease Outcome: Progressing   Problem: Nutrition: Goal: Adequate nutrition will be maintained Outcome: Progressing   Problem: Elimination: Goal: Will not experience complications related to bowel motility Outcome: Progressing Goal: Will not experience complications related to urinary retention Outcome: Progressing   Problem: Pain Managment: Goal: General experience of comfort will improve Outcome: Progressing   Problem: Safety: Goal: Ability to remain free from injury will improve Outcome: Progressing   Problem: Skin Integrity: Goal: Risk for impaired skin integrity will decrease Outcome: Progressing   Problem: Education: Goal: Ability to demonstrate management of disease process will improve Outcome: Progressing Goal: Ability to verbalize understanding of medication therapies will improve Outcome: Progressing   Problem: Activity: Goal: Capacity to carry out activities will improve Outcome: Progressing   Problem: Cardiac: Goal: Ability to achieve and maintain adequate cardiopulmonary perfusion will improve Outcome: Progressing   Problem: Health Behavior/Discharge Planning: Goal: Ability to manage health-related needs will improve Outcome: Progressing   Problem: Clinical Measurements: Goal: Complications related to the disease process or treatment will be avoided or  minimized Outcome: Progressing Goal: Dialysis access will remain free of complications Outcome: Progressing   Problem: Activity: Goal: Activity intolerance will improve Outcome: Progressing   Problem: Fluid Volume: Goal: Fluid volume balance will be maintained or improved Outcome: Progressing   Problem: Nutritional: Goal: Ability to make appropriate dietary choices will improve Outcome: Progressing   Problem: Respiratory: Goal: Respiratory symptoms related to disease process will be avoided Outcome: Progressing   Problem: Self-Concept: Goal: Body image disturbance will be avoided or minimized Outcome: Progressing   Problem: Urinary Elimination: Goal: Progression of disease will be identified and treated Outcome: Progressing

## 2018-08-11 NOTE — Progress Notes (Signed)
[facility]    NEPHROLOGY PROGRESS NOTE  SUBJECTIVE: Patient feels much better today.  Denies chest pain, shortness of breath, nausea, vomiting, diarrhea or dysuria.  Continues to have oxygen requirement.  Having difficulty with catheter.     OBJECTIVE:  Vitals:   08/11/18 1200 08/11/18 1230  BP: 98/75 98/71  Pulse: 90 93  Resp: 12 19  Temp:    SpO2: 97% 97%    Intake/Output Summary (Last 24 hours) at 08/11/2018 1330 Last data filed at 08/11/2018 0800 Gross per 24 hour  Intake 1377.24 ml  Output -  Net 1377.24 ml      Genearl:  AAOx3 NAD HEENT: MMM [place] AT anicteric sclera Neck:  (+) JVD, no adenopathy CV:  Heart RRR (+) systolic murmus Lungs:  L/S CTA bilaterally Abd:  abd SNT/ND with normal BS GU:  Bladder non-palpable Extremities:  No LE edema. Skin:  No skin rash  MEDICATIONS:  . sodium chloride   Intravenous Once  . alteplase  2 mg Intracatheter Once  . amiodarone  200 mg Oral BID  . aspirin EC  81 mg Oral Daily  . atorvastatin  80 mg Oral q1800  . chlorhexidine  15 mL Mouth Rinse BID  . Chlorhexidine Gluconate Cloth  6 each Topical Daily  . darbepoetin (ARANESP) injection - DIALYSIS  200 mcg Intravenous Q Thu-HD  . mouth rinse  15 mL Mouth Rinse BID  . mexiletine  150 mg Oral Q8H  . midodrine  10 mg Oral TID WC  . pantoprazole  40 mg Oral BID  . sodium chloride flush  3 mL Intravenous Q12H       LABS:   CBC Latest Ref Rng & Units 08/11/2018 08/10/2018 08/09/2018  WBC 4.0 - 10.5 K/uL 11.4(H) 12.9(H) 10.8(H)  Hemoglobin 13.0 - 17.0 g/dL 9.6(L) 10.1(L) 9.8(L)  Hematocrit 39.0 - 52.0 % 32.0(L) 33.9(L) 31.5(L)  Platelets 150 - 400 K/uL 110(L) 114(L) 99(L)    CMP Latest Ref Rng & Units 08/11/2018 08/10/2018 08/09/2018  Glucose 70 - 99 mg/dL 95 107(H) 125(H)  BUN 8 - 23 mg/dL 38(H) 19 21  Creatinine 0.61 - 1.24 mg/dL 4.22(H) 2.47(H) 2.49(H)  Sodium 135 - 145 mmol/L 129(L) 134(L) 135  Potassium 3.5 - 5.1 mmol/L 5.0 4.5 4.9  Chloride 98 - 111 mmol/L  97(L) 101 99  CO2 22 - 32 mmol/L 20(L) 25 24  Calcium 8.9 - 10.3 mg/dL 7.7(L) 7.9(L) 8.0(L)  Total Protein 6.5 - 8.1 g/dL - - -  Total Bilirubin 0.3 - 1.2 mg/dL - - -  Alkaline Phos 38 - 126 U/L - - -  AST 15 - 41 U/L - - -  ALT 0 - 44 U/L - - -    Lab Results  Component Value Date   CALCIUM 7.7 (L) 08/11/2018   CAION 1.00 (L) 07/23/2018   CAION 1.06 (L) 07/22/2018   PHOS 2.5 08/11/2018       Component Value Date/Time   COLORURINE YELLOW 08/02/2018 2208   APPEARANCEUR HAZY (A) 07/18/2018 2208   LABSPEC 1.013 07/14/2018 2208   PHURINE 5.0 07/06/2018 [place] 07/17/2018 2208   HGBUR NEGATIVE 08/03/2018 2208   BILIRUBINUR NEGATIVE 07/27/2018 [place] 07/21/2018 2208   PROTEINUR NEGATIVE 07/12/2018 2208   NITRITE NEGATIVE 07/14/2018 2208   LEUKOCYTESUR NEGATIVE 07/25/2018 2208      Component Value Date/Time   HCO3 24.0 07/17/2018 0908   HCO3 25.7 07/16/2018 0908   TCO2 25 07/17/2018 0908  TCO2 27 07/09/2018 0908   ACIDBASEDEF 1.0 07/19/2018 0908   O2SAT 89.5 08/11/2018 0503       Component Value Date/Time   IRON 14 (L) 07/25/2018 1651   TIBC 458 (H) 07/07/2018 1651   FERRITIN 28 07/06/2018 1651   IRONPCTSAT 3 (L) 08/03/2018 1651       ASSESSMENT/PLAN:      1. Acute systolic heart failure ejection fraction 15% severe RV dysfunction with cardiogenic shock. CVPs up [place] 1/30 w/ high PCW 25. Still requiring levophed (dose increased) and milrinone.  Has had multiple episodes of clotting the circuit.    Receiving intermittent dialysis today.  Depending on tolerance, may consider repeat intermittent dialysis tomorrow versus re-initiation of CRRT.  2. Coronary artery disease continues aspirin and statin will need cardiac cath prior to discharge- holding for now in case renal function improves  3. Acute kidney injurycrt 1.34 in August 2019secondary to acute tubular necrosis and hypoperfusion admission creatinine 3.91.Echogenic right  kidney compatible medical renal disease. No urine output.    4. Anemia- havestarteddarbepoetin. Completed RBC transfusion 1/26/2020and again 2/4  5. Hypertension/volume. Blood pressure is now low.  Physical exam at substantially improved, with no edema and skin tenting noted.  Chest x-ray has improved.    CVP elevated today.  Will attempt ultrafiltration on hemodialysis.  6.  Leukocytosis- climbing on steroids, cultures drawn and neg,abx, possible aspiration  7.  VT: on amio and mexilitine    [employer], DO, FACP

## 2018-08-11 NOTE — Progress Notes (Signed)
Patient ID: Samuel Bates, male   DOB: 09/20/1955, 62 y.o.   MRN: [id number]     Advanced Heart Failure Rounding Note  PCP-Cardiologist: No primary care provider on file.   Subjective:    NE @ 20. Remains on milrinone 0.125. On midodrine 10 mg TID. SBP 90s. Coox 74% yesterday, not accurate today. Remains on 6L [place].  ESR was 9. CXR with diffuse suspected pulmonary edema. CVP remains 15-16 today.    He remains anuric.  Had been on CVVH, getting 1st HD run today.  Tolerating so far.   Now on po amio and mexilitine. No VT.   On empiric Cefepime, vancomycin stopped. WBC 11.4. Blood cx 2/1 NGTD. Afebrile.   Received 2 uPRBCs 2/4. Hemoglobin fairly stable 9.6.   Denies dyspnea this morning, was out of bed to chair yesterday.   Echo 07/14/2018 LVEF 15%, with severe RV failure.   RHC 08/02/2018 On milrinone 0.125 and NE 14 RA = 13 RV = 51/18 PA = 50/24 (34) PCW = 25 (v = 35) Fick cardiac output/index = 5.42/2.59 Thermo CO/CI = 5.49/2.63 PVR = 1.6 WU FA sat = 97% PA sat = 54%, 58%  Objective:   Weight Range: 71.8 kg Body mass index is 19.78 kg/m.   Vital Signs:   Temp:  [97.6 F (36.4 C)-98.3 F (36.8 C)] 98.3 F (36.8 C) (02/08 0751) Pulse Rate:  [85-94] 93 (02/08 0700) Resp:  [13-29] 19 (02/08 0700) BP: (84-103)/(56-78) 97/68 (02/08 0700) SpO2:  [89 %-98 %] 92 % (02/08 0700) Weight:  [71.8 kg] 71.8 kg (02/08 0500) Last BM Date: 08/09/18  Weight change: Filed Weights   08/09/18 0615 08/10/18 0615 08/11/18 0500  Weight: 73.8 kg 68.9 kg 71.8 kg   Intake/Output:   Intake/Output Summary (Last 24 hours) at 08/11/2018 0812 Last data filed at 08/11/2018 0500 Gross per 24 hour  Intake 2042.04 ml  Output 806 ml  Net 1236.04 ml    Physical Exam    General: NAD Neck: JVP 12 cm, no thyromegaly or thyroid nodule.  Lungs: Clear to auscultation bilaterally with normal respiratory effort. CV: Nondisplaced PMI.  Heart regular S1/S2, no S3/S4, no murmur.  No peripheral edema.     Abdomen: Soft, nontender, no hepatosplenomegaly, no distention.  Skin: Intact without lesions or rashes.  Neurologic: Alert and oriented x 3.  Psych: Normal affect. Extremities: No clubbing or cyanosis.  HEENT: Normal.   Telemetry   NSR 80-90s. No VT. Personally reviewed.   Labs    CBC Recent Labs    08/10/18 0429 08/11/18 0454  WBC 12.9* 11.4*  HGB 10.1* 9.6*  HCT 33.9* 32.0*  MCV 76.9* 76.6*  PLT 114* 110*   Basic Metabolic Panel Recent Labs    08/10/18 0429 08/11/18 0454  NA 134* 129*  K 4.5 5.0  CL 101 97*  CO2 25 20*  GLUCOSE 107* 95  BUN 19 38*  CREATININE 2.47* 4.22*  CALCIUM 7.9* 7.7*  MG 2.7* 2.5*  PHOS 1.6* 2.5   Liver Function Tests Recent Labs    08/10/18 0429 08/11/18 0454  ALBUMIN 2.7* 2.4*   No results for input(s): LIPASE, AMYLASE in the last 72 hours. Cardiac Enzymes No results for input(s): CKTOTAL, CKMB, CKMBINDEX, TROPONINI in the last 72 hours.  BNP: BNP (last 3 results) Recent Labs    07/24/2018 1651  BNP 4,188.4*    ProBNP (last 3 results) No results for input(s): PROBNP in the last 8760 hours.   D-Dimer No results for input(s): DDIMER   in the last 72 hours. Hemoglobin A1C No results for input(s): HGBA1C in the last 72 hours. Fasting Lipid Panel No results for input(s): CHOL, HDL, LDLCALC, TRIG, CHOLHDL, LDLDIRECT in the last 72 hours. Thyroid Function Tests No results for input(s): TSH, T4TOTAL, T3FREE, THYROIDAB in the last 72 hours.  Invalid input(s): FREET3  Other results:   Imaging    Dg Chest Port 1 View  Result Date: 08/10/2018 CLINICAL DATA:  Increased oxygen requirement, shortness of breath EXAM: PORTABLE CHEST 1 VIEW COMPARISON:  08/09/2018 FINDINGS: No significant change in AP portable examination with diffuse interstitial pulmonary opacity, likely edema, gross cardiomegaly, and vascular catheters. A left neck vascular catheter remains with tip directed likely into the azygos vein. Unchanged gross  cardiomegaly and/or pericardial effusion. IMPRESSION: No significant change in AP portable examination with diffuse interstitial pulmonary opacity, likely edema, gross cardiomegaly, and vascular catheters. A left neck vascular catheter remains with tip directed likely into the azygos vein. Unchanged gross cardiomegaly and/or pericardial effusion. Electronically Signed   By: Eddie Candle M.D.   On: 08/10/2018 09:13     Medications:     Scheduled Medications: . sodium chloride   Intravenous Once  . amiodarone  200 mg Oral BID  . aspirin EC  81 mg Oral Daily  . atorvastatin  80 mg Oral q1800  . chlorhexidine  15 mL Mouth Rinse BID  . Chlorhexidine Gluconate Cloth  6 each Topical Daily  . darbepoetin (ARANESP) injection - DIALYSIS  200 mcg Intravenous Q Thu-HD  . mouth rinse  15 mL Mouth Rinse BID  . mexiletine  150 mg Oral Q8H  . midodrine  10 mg Oral TID WC  . pantoprazole  40 mg Oral BID  . sodium chloride flush  3 mL Intravenous Q12H    Infusions: . sodium chloride    . sodium chloride Stopped (08/10/18 2228)  . ceFEPime (MAXIPIME) IV Stopped (08/10/18 2206)  . heparin 10,000 units/ 20 mL infusion syringe Stopped (08/10/18 1150)  . norepinephrine (LEVOPHED) Adult infusion 20 mcg/min (08/11/18 0500)  . prismasol BGK 4/2.5 2,000 mL/hr at 08/10/18 0928    PRN Medications: Place/Maintain arterial line **AND** sodium chloride, sodium chloride, acetaminophen, nitroGLYCERIN, ondansetron (ZOFRAN) IV, sodium chloride, sodium chloride flush   Patient Profile   63 y.o.malewith past medical history of hypertension, hyperlipidemia admitted with recent but late presenting inferior lateral myocardial infarction, cardiogenic shock, acute renal failure and microcytic anemia. Echocardiogram showed severe LV and RV dysfunction and moderate mitral regurgitation.  Assessment/Plan   1. Acute systolic HF with cardiogenic shock due to OOH MI: Ischemic cardiomyopathy, echo 1/5 EF 15% severe RV  dysfunction and moderate MR.  Co-ox yesterday 74%, today inaccurate.  Now on NE 20, milrinone 0.125. CVP 15-16. Still requiring significant oxygen support.  - Volume removal by iHD today.  - Continue midodrine 10 mg TID. - Will stop milrinone today.   - Wean NE as able, would like to see SBP > 90 for renal function.  - No ACE/ARB/ARNI or b-blocker with shock and AKI 2. CAD: s/p OOH inferolateral MI. Initial trop 28, then trended down.  No chest pain.  - Continue ASA and statin. No b-blocker with shock.  - If renal recovery looks unlikely, will need coronary angiography.  He is anuric and now on iHD, suspect not going to have effective recovery.  3. AKI/ESRD with uremia: likely due to ATN and hypoperfusion. Initial BUN/CR 198/3.91. Baseline creatinine normal several months ago. Renal following CRRT started on 1/25, now  on iHD, first run today. Remains anuric.  Chances for renal recovery looking more slim.  Needs more volume off with CVP 15-16.  4. Acute hypoxic respiratory failure: CXR with bilateral interstitial edema - Now on 6L HFNC. Does not feel SOB. Hopefully will improve with volume removal. 5. Microcytic anemia due to IDA - Iron stores low. Has received Feraheme - Will eventually need GI w/u - Continue protonix 6. Shock liver: Improving. No change.  7. VT: Quiescent on amiodarone gtt and PO mexilitene. No further VT.  8. L wrist pain: Suspect gout. Improved.  9. Leukocytosis: Covered for potential PNA with cefepime currently.     CRITICAL CARE Performed by: Loralie Champagne  Total critical care time: 35 minutes  Critical care time was exclusive of separately billable procedures and treating other patients.  Critical care was necessary to treat or prevent imminent or life-threatening deterioration.  Critical care was time spent personally by me (independent of midlevel providers or residents) on the following activities: development of treatment plan with patient and/or surrogate as  well as nursing, discussions with consultants, evaluation of patient's response to treatment, examination of patient, obtaining history from patient or surrogate, ordering and performing treatments and interventions, ordering and review of laboratory studies, ordering and review of radiographic studies, pulse oximetry and re-evaluation of patient's condition.  Loralie Champagne, MD  8:12 AM

## 2018-08-12 LAB — CBC
HCT: 30.3 % — ABNORMAL LOW (ref 39.0–52.0)
Hemoglobin: 9.5 g/dL — ABNORMAL LOW (ref 13.0–17.0)
MCH: 23.8 pg — ABNORMAL LOW (ref 26.0–34.0)
MCHC: 31.4 g/dL (ref 30.0–36.0)
MCV: 75.8 fL — ABNORMAL LOW (ref 80.0–100.0)
Platelets: 106 10*3/uL — ABNORMAL LOW (ref 150–400)
RBC: 4 MIL/uL — ABNORMAL LOW (ref 4.22–5.81)
RDW: 29.4 % — ABNORMAL HIGH (ref 11.5–15.5)
WBC: 9.9 10*3/uL (ref 4.0–10.5)
nRBC: 39.2 % — ABNORMAL HIGH (ref 0.0–0.2)

## 2018-08-12 LAB — APTT: aPTT: 47 seconds — ABNORMAL HIGH (ref 24–36)

## 2018-08-12 LAB — RENAL FUNCTION PANEL
Albumin: 2.3 g/dL — ABNORMAL LOW (ref 3.5–5.0)
Anion gap: 14 (ref 5–15)
BUN: 41 mg/dL — ABNORMAL HIGH (ref 8–23)
CALCIUM: 8 mg/dL — AB (ref 8.9–10.3)
CO2: 21 mmol/L — ABNORMAL LOW (ref 22–32)
Chloride: 95 mmol/L — ABNORMAL LOW (ref 98–111)
Creatinine, Ser: 4.71 mg/dL — ABNORMAL HIGH (ref 0.61–1.24)
GFR calc non Af Amer: 12 mL/min — ABNORMAL LOW (ref >60)
GFR, EST AFRICAN AMERICAN: 14 mL/min — AB (ref >60)
Glucose, Bld: 100 mg/dL — ABNORMAL HIGH (ref 70–99)
PHOSPHORUS: 3.4 mg/dL (ref 2.5–4.6)
Potassium: 4.7 mmol/L (ref 3.5–5.1)
Sodium: 130 mmol/L — ABNORMAL LOW (ref 135–145)

## 2018-08-12 LAB — HEPATITIS B SURFACE ANTIGEN: Hepatitis B Surface Ag: NEGATIVE

## 2018-08-12 LAB — POCT ACTIVATED CLOTTING TIME
Activated Clotting Time: 136 seconds
Activated Clotting Time: 136 seconds

## 2018-08-12 LAB — HEPATITIS B CORE ANTIBODY, TOTAL: Hep B Core Total Ab: NEGATIVE

## 2018-08-12 LAB — HEPATITIS B SURFACE ANTIBODY, QUANTITATIVE: Hep B S AB Quant (Post): <3.1 m[IU]/mL — ABNORMAL LOW (ref >9.9)

## 2018-08-12 LAB — MAGNESIUM: Magnesium: 2.3 mg/dL (ref 1.7–2.4)

## 2018-08-12 MED ORDER — SODIUM CHLORIDE 0.9 % IV SOLN
2.0000 g | Freq: Two times a day (BID) | INTRAVENOUS | Status: DC
  Administered 2018-08-12 – 2018-08-14 (×4): 2 g via INTRAVENOUS
  Filled 2018-08-12 (×5): qty 2

## 2018-08-12 MED ORDER — HEPARIN SODIUM (PORCINE) 1000 UNIT/ML DIALYSIS
1000.0000 [IU] | INTRAMUSCULAR | Status: DC | PRN
  Filled 2018-08-12: qty 6

## 2018-08-12 MED ORDER — PRISMASOL BGK 4/2.5 32-4-2.5 MEQ/L REPLACEMENT SOLN
Status: DC
  Administered 2018-08-12 – 2018-08-17 (×4): via INTRAVENOUS_CENTRAL
  Filled 2018-08-12 (×7): qty 5000

## 2018-08-12 MED ORDER — PRISMASOL BGK 4/2.5 32-4-2.5 MEQ/L IV SOLN
INTRAVENOUS | Status: DC
  Administered 2018-08-12 – 2018-08-18 (×29): via INTRAVENOUS_CENTRAL
  Filled 2018-08-12 (×61): qty 5000

## 2018-08-12 MED ORDER — ALTEPLASE 2 MG IJ SOLR: 2.0000 mg | Freq: Once | INTRAMUSCULAR | Status: DC | PRN

## 2018-08-12 MED ORDER — PRISMASOL BGK 4/2.5 32-4-2.5 MEQ/L REPLACEMENT SOLN
Status: DC
  Administered 2018-08-12 – 2018-08-17 (×4): via INTRAVENOUS_CENTRAL
  Filled 2018-08-12 (×7): qty 5000

## 2018-08-12 MED ORDER — HEPARIN SODIUM (PORCINE) 1000 UNIT/ML DIALYSIS
1000.0000 [IU] | INTRAMUSCULAR | Status: DC | PRN
  Administered 2018-08-13: 1900 [IU] via INTRAVENOUS_CENTRAL
  Administered 2018-08-14: 4000 [IU] via INTRAVENOUS_CENTRAL
  Administered 2018-08-15 – 2018-08-18 (×3): 3800 [IU] via INTRAVENOUS_CENTRAL
  Administered 2018-08-19: 2800 [IU] via INTRAVENOUS_CENTRAL
  Filled 2018-08-12 (×8): qty 6

## 2018-08-12 MED ORDER — ALTEPLASE 2 MG IJ SOLR
2.0000 mg | Freq: Once | INTRAMUSCULAR | Status: AC
  Administered 2018-08-12: 2 mg
  Filled 2018-08-12: qty 2

## 2018-08-12 NOTE — Progress Notes (Signed)
[facility]    NEPHROLOGY PROGRESS NOTE  SUBJECTIVE: Patient feels much better today.  Denies chest pain, shortness of breath, nausea, vomiting, diarrhea or dysuria.  Continues to have oxygen requirement.  Pressor requirement is decreasing.    OBJECTIVE:  Vitals:   08/12/18 1400 08/12/18 1434  BP: (!) 118/59   Pulse: 85   Resp: (!) 22   Temp:    SpO2: 90% 93%    Intake/Output Summary (Last 24 hours) at 08/12/2018 1556 Last data filed at 08/12/2018 1400 Gross per 24 hour  Intake 1080.27 ml  Output 5 ml  Net 1075.27 ml      Genearl:  AAOx3 NAD HEENT: MMM [place] AT anicteric sclera Neck:  (+) JVD, no adenopathy CV:  Heart RRR (+) systolic murmus Lungs:  L/S CTA bilaterally Abd:  abd SNT/ND with normal BS GU:  Bladder non-palpable Extremities:  No LE edema. Skin:  No skin rash  MEDICATIONS:  . sodium chloride   Intravenous Once  . amiodarone  200 mg Oral BID  . aspirin EC  81 mg Oral Daily  . atorvastatin  80 mg Oral q1800  . chlorhexidine  15 mL Mouth Rinse BID  . Chlorhexidine Gluconate Cloth  6 each Topical Daily  . darbepoetin (ARANESP) injection - DIALYSIS  200 mcg Intravenous Q Thu-HD  . mouth rinse  15 mL Mouth Rinse BID  . mexiletine  150 mg Oral Q8H  . midodrine  10 mg Oral TID WC  . pantoprazole  40 mg Oral BID  . sodium chloride flush  3 mL Intravenous Q12H       LABS:   CBC Latest Ref Rng & Units 08/12/2018 08/11/2018 08/10/2018  WBC 4.0 - 10.5 K/uL 9.9 11.4(H) 12.9(H)  Hemoglobin 13.0 - 17.0 g/dL 9.5(L) 9.6(L) 10.1(L)  Hematocrit 39.0 - 52.0 % 30.3(L) 32.0(L) 33.9(L)  Platelets 150 - 400 K/uL 106(L) 110(L) 114(L)    CMP Latest Ref Rng & Units 08/12/2018 08/11/2018 08/10/2018  Glucose 70 - 99 mg/dL 100(H) 95 107(H)  BUN 8 - 23 mg/dL 41(H) 38(H) 19  Creatinine 0.61 - 1.24 mg/dL 4.71(H) 4.22(H) 2.47(H)  Sodium 135 - 145 mmol/L 130(L) 129(L) 134(L)  Potassium 3.5 - 5.1 mmol/L 4.7 5.0 4.5  Chloride 98 - 111 mmol/L 95(L) 97(L) 101  CO2 22 - 32  mmol/L 21(L) 20(L) 25  Calcium 8.9 - 10.3 mg/dL 8.0(L) 7.7(L) 7.9(L)  Total Protein 6.5 - 8.1 g/dL - - -  Total Bilirubin 0.3 - 1.2 mg/dL - - -  Alkaline Phos 38 - 126 U/L - - -  AST 15 - 41 U/L - - -  ALT 0 - 44 U/L - - -    Lab Results  Component Value Date   CALCIUM 8.0 (L) 08/12/2018   CAION 1.00 (L) 07/17/2018   CAION 1.06 (L) 07/17/2018   PHOS 3.4 08/12/2018       Component Value Date/Time   COLORURINE YELLOW 08/02/2018 2208   APPEARANCEUR HAZY (A) 07/04/2018 2208   LABSPEC 1.013 07/09/2018 2208   PHURINE 5.0 07/10/2018 [place] 07/11/2018 2208   HGBUR NEGATIVE 07/29/2018 2208   BILIRUBINUR NEGATIVE 07/26/2018 [place] 07/25/2018 2208   PROTEINUR NEGATIVE 07/15/2018 2208   NITRITE NEGATIVE 07/20/2018 2208   LEUKOCYTESUR NEGATIVE 07/16/2018 2208      Component Value Date/Time   HCO3 24.0 07/22/2018 0908   HCO3 25.7 07/31/2018 0908   TCO2 25 07/27/2018 0908   TCO2 27 07/20/2018 0908  ACIDBASEDEF 1.0 07/19/2018 0908   O2SAT 89.5 08/11/2018 0503       Component Value Date/Time   IRON 14 (L) 08/01/2018 1651   TIBC 458 (H) 07/07/2018 1651   FERRITIN 28 07/05/2018 1651   IRONPCTSAT 3 (L) 07/21/2018 1651       ASSESSMENT/PLAN:      1. Acute systolic heart failure ejection fraction 15% severe RV dysfunction with cardiogenic shock. CVPs up [place] 1/30 w/ high PCW 25. Still requiring levophed (dose increased) and milrinone.  Has had multiple episodes of clotting the circuit.    Will reinitiate CRRT for volume management.  2. Coronary artery disease continues aspirin and statin will need cardiac cath prior to discharge- holding for now in case renal function improves  3. Acute kidney injurycrt 1.34 in August 2019secondary to acute tubular necrosis and hypoperfusion admission creatinine 3.91.Echogenic right kidney compatible medical renal disease. No urine output.    4. Anemia- havestarteddarbepoetin. Completed RBC  transfusion 1/26/2020and again 2/4  5. Hypertension/volume. Blood pressure is now low.  Physical exam at substantially improved, with no edema and skin tenting noted.  Chest x-ray has improved.    CVP elevated today.  Will attempt ultrafiltration on hemodialysis.  6.  Leukocytosis- climbing on steroids, cultures drawn and neg,abx, possible aspiration  7.  VT: on amio and mexilitine    [employer], DO, FACP

## 2018-08-12 NOTE — Progress Notes (Signed)
Patient ID: Samuel Bates, male   DOB: Apr 09, 1956, 63 y.o.   MRN: [id number]     Advanced Heart Failure Rounding Note  PCP-Cardiologist: No primary care provider on file.   Subjective:    He is now off milrinone with NE down to 12. On midodrine 10 mg TID. SBP 90s. No co-ox today. Remains on 6L [place].  ESR was 9. CXR with diffuse suspected pulmonary edema. He had a dialysis run yesterday. CVP remains 13 today.    He remains anuric.   Now on po amio and mexilitine. No VT.   On empiric Cefepime, vancomycin stopped. WBC 9.9. Blood cx 2/1 NGTD. Afebrile.   Received 2 uPRBCs 2/4. Hemoglobin fairly stable 9.5.   Says breathing is "shallow" but generally comfortable.    Echo 07/21/2018 LVEF 15%, with severe RV failure.   RHC 08/02/2018 On milrinone 0.125 and NE 14 RA = 13 RV = 51/18 PA = 50/24 (34) PCW = 25 (v = 35) Fick cardiac output/index = 5.42/2.59 Thermo CO/CI = 5.49/2.63 PVR = 1.6 WU FA sat = 97% PA sat = 54%, 58%  Objective:   Weight Range: 71.2 kg Body mass index is 19.62 kg/m.   Vital Signs:   Temp:  [97.2 F (36.2 C)-98.3 F (36.8 C)] 98.2 F (36.8 C) (02/09 0400) Pulse Rate:  [53-96] 85 (02/09 0600) Resp:  [10-25] 20 (02/09 0600) BP: (87-101)/(59-77) 88/59 (02/09 0600) SpO2:  [64 %-97 %] 95 % (02/09 0600) Weight:  [71.2 kg-71.8 kg] 71.2 kg (02/09 0500) Last BM Date: 08/09/18  Weight change: Filed Weights   08/11/18 0500 08/11/18 0751 08/12/18 0500  Weight: 71.8 kg 71.8 kg 71.2 kg   Intake/Output:   Intake/Output Summary (Last 24 hours) at 08/12/2018 0718 Last data filed at 08/12/2018 0600 Gross per 24 hour  Intake 1145.42 ml  Output 1564 ml  Net -418.58 ml    Physical Exam    General: NAD Neck: JVP 12 cm, no thyromegaly or thyroid nodule.  Lungs: Clear to auscultation bilaterally with normal respiratory effort. CV: Nondisplaced PMI.  Heart regular S1/S2, no S3/S4, no murmur.  No peripheral edema.   Abdomen: Soft, nontender, no hepatosplenomegaly, no  distention.  Skin: Intact without lesions or rashes.  Neurologic: Alert and oriented x 3.  Psych: Normal affect. Extremities: No clubbing or cyanosis.  HEENT: Normal.    Telemetry   NSR 80s-90s. No VT. Personally reviewed.   Labs    CBC Recent Labs    08/11/18 0454 08/12/18 0238  WBC 11.4* 9.9  HGB 9.6* 9.5*  HCT 32.0* 30.3*  MCV 76.6* 75.8*  PLT 110* 456*   Basic Metabolic Panel Recent Labs    08/11/18 0454 08/12/18 0238  NA 129* 130*  K 5.0 4.7  CL 97* 95*  CO2 20* 21*  GLUCOSE 95 100*  BUN 38* 41*  CREATININE 4.22* 4.71*  CALCIUM 7.7* 8.0*  MG 2.5* 2.3  PHOS 2.5 3.4   Liver Function Tests Recent Labs    08/11/18 0454 08/12/18 0238  ALBUMIN 2.4* 2.3*   No results for input(s): LIPASE, AMYLASE in the last 72 hours. Cardiac Enzymes No results for input(s): CKTOTAL, CKMB, CKMBINDEX, TROPONINI in the last 72 hours.  BNP: BNP (last 3 results) Recent Labs    07/16/2018 1651  BNP 4,188.4*    ProBNP (last 3 results) No results for input(s): PROBNP in the last 8760 hours.   D-Dimer No results for input(s): DDIMER in the last 72 hours. Hemoglobin A1C No results for  input(s): HGBA1C in the last 72 hours. Fasting Lipid Panel No results for input(s): CHOL, HDL, LDLCALC, TRIG, CHOLHDL, LDLDIRECT in the last 72 hours. Thyroid Function Tests No results for input(s): TSH, T4TOTAL, T3FREE, THYROIDAB in the last 72 hours.  Invalid input(s): FREET3  Other results:   Imaging    No results found.   Medications:     Scheduled Medications: . sodium chloride   Intravenous Once  . alteplase  2 mg Intracatheter Once  . amiodarone  200 mg Oral BID  . aspirin EC  81 mg Oral Daily  . atorvastatin  80 mg Oral q1800  . chlorhexidine  15 mL Mouth Rinse BID  . Chlorhexidine Gluconate Cloth  6 each Topical Daily  . darbepoetin (ARANESP) injection - DIALYSIS  200 mcg Intravenous Q Thu-HD  . mouth rinse  15 mL Mouth Rinse BID  . mexiletine  150 mg Oral Q8H    . midodrine  10 mg Oral TID WC  . pantoprazole  40 mg Oral BID  . sodium chloride flush  3 mL Intravenous Q12H    Infusions: . sodium chloride    . sodium chloride Stopped (08/11/18 2248)  . sodium chloride    . sodium chloride    . ceFEPime (MAXIPIME) IV Stopped (08/11/18 2228)  . heparin 10,000 units/ 20 mL infusion syringe Stopped (08/10/18 1150)  . norepinephrine (LEVOPHED) Adult infusion 12 mcg/min (08/12/18 0600)  . prismasol BGK 4/2.5 2,000 mL/hr at 08/10/18 2162    PRN Medications: Place/Maintain arterial line **AND** sodium chloride, sodium chloride, sodium chloride, sodium chloride, acetaminophen, heparin, magic mouthwash, nitroGLYCERIN, ondansetron (ZOFRAN) IV, sodium chloride, sodium chloride flush   Patient Profile   63 y.o.malewith past medical history of hypertension, hyperlipidemia admitted with recent but late presenting inferior lateral myocardial infarction, cardiogenic shock, acute renal failure and microcytic anemia. Echocardiogram showed severe LV and RV dysfunction and moderate mitral regurgitation.  Assessment/Plan   1. Acute systolic HF with cardiogenic shock due to OOH MI: Ischemic cardiomyopathy, echo 1/5 EF 15% severe RV dysfunction and moderate MR.  Now on NE 12 (decreased) and off milrinone. CVP 13. Still requiring significant oxygen support.  - Will send co-ox.  - Would favor iHD again today for further volume removal, tolerated yesterday.  - Continue midodrine 10 mg TID.   - Continue to wean NE as able, would like to see SBP > 90 for renal function.  - No ACE/ARB/ARNI or b-blocker with shock and AKI 2. CAD: s/p OOH inferolateral MI. Initial trop 28, then trended down.  No chest pain.  - Continue ASA and statin. No b-blocker with shock.  - If renal recovery looks unlikely, will need coronary angiography.  He is anuric and now on iHD, suspect not going to have effective recovery.  3. AKI/ESRD with uremia: likely due to ATN and hypoperfusion.  Initial BUN/CR 198/3.91. Baseline creatinine normal several months ago. Renal following CRRT started on 1/25, now on iHD, first run yesterday. Remains anuric.  Chances for renal recovery looking more slim.  Needs more volume off with CVP 13.  - Hopefully HD again today for volume removal.  4. Acute hypoxic respiratory failure: CXR with bilateral interstitial edema - Now on 6L HFNC. Does not feel SOB. Hopefully will improve with volume removal. 5. Microcytic anemia due to IDA - Iron stores low. Has received Feraheme - Will eventually need GI w/u - Continue protonix 6. Shock liver: Improving. No change.  7. VT: Quiescent on amiodarone gtt and PO mexilitene. No  further VT.  8. L wrist pain: Suspect gout. Improved.  9. Leukocytosis: Covered for potential PNA with cefepime currently.     Loralie Champagne, MD  7:18 AM

## 2018-08-12 NOTE — Progress Notes (Signed)
Removed TPA dwell that was instilled 2 hrs earlier today from both ports of R- Chest [facility] to obtain blood return. Removed 10 ml blood easily from Blue port of [place]. Red port with scant, sluggish blood return until patient raised right arm above head, then 10 ml brisk blood return easily obtained as discard. Each port flushed with 10 ml Normal saline and clamped. Patient RN to start CRRT.

## 2018-08-12 NOTE — Progress Notes (Signed)
Pharmacy Antibiotic Note  Samuel Bates is a 63 y.o. male admitted on 07/22/2018 with cardiogenic shock s/p MI at [facility].    Pharmacy has been consulted for cefepime dosing in setting of HCAP  CRRT stopped 2/7, had 1 session iHD 2/8, now resuming CRRT.  Plan: -Change cefepime back to 2g q 12 hrs -Monitor CRRT, clinical pic, cx results, and levels as appropriate    Height: 6\' 3"  (190.5 cm) Weight: 156 lb 15.5 oz (71.2 kg) IBW/kg (Calculated) : 84.5  Temp (24hrs), Avg:97.8 F (36.6 C), Min:97.2 F (36.2 C), Max:98.2 F (36.8 C)  Recent Labs  Lab 08/08/18 0427 08/08/18 1524 08/09/18 0556 08/09/18 1731 08/10/18 0429 08/11/18 0454 08/12/18 0238  WBC 13.1*  --  10.8*  --  12.9* 11.4* 9.9  CREATININE 3.95* 3.16* 2.59* 2.49* 2.47* 4.22* 4.71*  VANCORANDOM  --  10  --   --   --   --   --     Estimated Creatinine Clearance: 16.4 mL/min (A) (by C-G formula based on SCr of 4.71 mg/dL (H)).    Allergies  Allergen Reactions  . Penicillins Hives    Tolerated cefepime 1/20 Did it involve swelling of the face/tongue/throat, SOB, or low BP? YES Did it involve sudden or severe rash/hives, skin peeling, or any reaction on the inside of your mouth or nose? NO Did you need to seek medical attention at a hospital or doctor's office? YES When did it last happen? 30 years ago If all above answers are "NO", may proceed with cephalosporin use.  . Simvastatin Other (See Comments)    insomnia    Antimicrobials this admission:  Vancomycin 1/25 >>1/26; 2/2 >>2/7 Cefepime 1/25 x1; 2/2 >>  Dose adjustments this admission:  *VR 10 > redose 750mg  IV q24h  Microbiology results:  1/25 BCx: neg 2/1 BCx: neg  Marguerite Olea, [facility] Clinical Pharmacist Phone [phone]  08/12/2018 10:07 AM

## 2018-08-12 NOTE — Progress Notes (Signed)
IV team called back at 13:41 regarding consult placed for No blood return from HD cath. Unable to start CRRT. Alteplace order procured and puled from [facility] by RN.  IV Team concerned about 4+ Alteplace in las 6 days and that this may be a kinked catheter. I stated that there has been multiple CRRT filter changes over the last week secondary to clotting and that we need to eliminate the possibility of clotting so that MD can order alternate course of action.  15:35 IV team placed alteplase in both lumen of HD cath. IV team will let dwell for 2 hours

## 2018-08-13 LAB — RENAL FUNCTION PANEL
Albumin: 2.3 g/dL — ABNORMAL LOW (ref 3.5–5.0)
Anion gap: 16 — ABNORMAL HIGH (ref 5–15)
BUN: 48 mg/dL — ABNORMAL HIGH (ref 8–23)
CO2: 18 mmol/L — ABNORMAL LOW (ref 22–32)
Calcium: 7.7 mg/dL — ABNORMAL LOW (ref 8.9–10.3)
Chloride: 96 mmol/L — ABNORMAL LOW (ref 98–111)
Creatinine, Ser: 5 mg/dL — ABNORMAL HIGH (ref 0.61–1.24)
GFR calc Af Amer: 13 mL/min — ABNORMAL LOW (ref >60)
GFR calc non Af Amer: 11 mL/min — ABNORMAL LOW (ref >60)
Glucose, Bld: 94 mg/dL (ref 70–99)
Phosphorus: 3.9 mg/dL (ref 2.5–4.6)
Potassium: 5.2 mmol/L — ABNORMAL HIGH (ref 3.5–5.1)
SODIUM: 130 mmol/L — AB (ref 135–145)

## 2018-08-13 LAB — BASIC METABOLIC PANEL
Anion gap: 17 — ABNORMAL HIGH (ref 5–15)
BUN: 40 mg/dL — ABNORMAL HIGH (ref 8–23)
CHLORIDE: 98 mmol/L (ref 98–111)
CO2: 17 mmol/L — ABNORMAL LOW (ref 22–32)
CREATININE: 3.72 mg/dL — AB (ref 0.61–1.24)
Calcium: 7.6 mg/dL — ABNORMAL LOW (ref 8.9–10.3)
GFR calc Af Amer: 19 mL/min — ABNORMAL LOW (ref >60)
GFR calc non Af Amer: 16 mL/min — ABNORMAL LOW (ref >60)
Glucose, Bld: 108 mg/dL — ABNORMAL HIGH (ref 70–99)
Potassium: 5.1 mmol/L (ref 3.5–5.1)
Sodium: 132 mmol/L — ABNORMAL LOW (ref 135–145)

## 2018-08-13 LAB — POCT ACTIVATED CLOTTING TIME
ACTIVATED CLOTTING TIME: 175 s
ACTIVATED CLOTTING TIME: 175 s
ACTIVATED CLOTTING TIME: 186 s
Activated Clotting Time: 153 seconds
Activated Clotting Time: 175 seconds
Activated Clotting Time: 175 seconds
Activated Clotting Time: 175 seconds
Activated Clotting Time: 180 seconds
Activated Clotting Time: 180 seconds
Activated Clotting Time: 180 seconds
Activated Clotting Time: 186 seconds
Activated Clotting Time: 186 seconds
Activated Clotting Time: 186 seconds
Activated Clotting Time: 191 seconds
Activated Clotting Time: 191 seconds
Activated Clotting Time: 191 seconds
Activated Clotting Time: 197 seconds
Activated Clotting Time: 197 seconds

## 2018-08-13 LAB — COOXEMETRY PANEL
Carboxyhemoglobin: 1.7 % — ABNORMAL HIGH (ref 0.5–1.5)
Carboxyhemoglobin: 1.5 % (ref 0.5–1.5)
Methemoglobin: 1.9 % — ABNORMAL HIGH (ref 0.0–1.5)
Methemoglobin: 1.2 % (ref 0.0–1.5)
O2 Saturation: 36.6 %
O2 Saturation: 32.9 %
Total hemoglobin: 8.7 g/dL — ABNORMAL LOW (ref 12.0–16.0)
Total hemoglobin: 9.8 g/dL — ABNORMAL LOW (ref 12.0–16.0)

## 2018-08-13 LAB — CBC
HEMATOCRIT: 29.8 % — AB (ref 39.0–52.0)
HEMOGLOBIN: 9 g/dL — AB (ref 13.0–17.0)
MCH: 22.7 pg — ABNORMAL LOW (ref 26.0–34.0)
MCHC: 30.2 g/dL (ref 30.0–36.0)
MCV: 75.3 fL — ABNORMAL LOW (ref 80.0–100.0)
Platelets: 98 10*3/uL — ABNORMAL LOW (ref 150–400)
RBC: 3.96 MIL/uL — ABNORMAL LOW (ref 4.22–5.81)
RDW: 29.1 % — ABNORMAL HIGH (ref 11.5–15.5)
WBC: 10.5 10*3/uL (ref 4.0–10.5)
nRBC: 45.9 % — ABNORMAL HIGH (ref 0.0–0.2)

## 2018-08-13 LAB — APTT: aPTT: 49 seconds — ABNORMAL HIGH (ref 24–36)

## 2018-08-13 LAB — MAGNESIUM: Magnesium: 2.4 mg/dL (ref 1.7–2.4)

## 2018-08-13 MED ORDER — ALTEPLASE 2 MG IJ SOLR
2.0000 mg | Freq: Once | INTRAMUSCULAR | Status: AC
  Administered 2018-08-13: 2 mg
  Filled 2018-08-13: qty 2

## 2018-08-13 MED ORDER — PRO-STAT SUGAR FREE PO LIQD
30.0000 mL | Freq: Two times a day (BID) | ORAL | Status: DC
  Administered 2018-08-13 – 2018-08-19 (×6): 30 mL via ORAL
  Filled 2018-08-13 (×10): qty 30

## 2018-08-13 MED ORDER — ENSURE ENLIVE PO LIQD
237.0000 mL | Freq: Two times a day (BID) | ORAL | Status: DC
  Administered 2018-08-14 (×2): 237 mL via ORAL

## 2018-08-13 MED ORDER — ENSURE MAX PROTEIN PO LIQD
11.0000 [oz_av] | Freq: Every day | ORAL | Status: DC
  Filled 2018-08-13: qty 330

## 2018-08-13 NOTE — Progress Notes (Addendum)
Patient ID: Samuel Bates, male   DOB: 08-22-1955, 63 y.o.   MRN: [id number]     Advanced Heart Failure Rounding Note  PCP-Cardiologist: No primary care provider on file.   Subjective:    Remains on cefepime. Blood CX negative.  Remains on NE 8 mcg and midodrine 10 tid  Tolerated iHD yesterday. HD cath clotted this morning. Says he had a little urine output.   Received 2 uPRBCs 2/4. Hgb 9.    Denies SOB/CP.    Echo 08/02/2018 LVEF 15%, with severe RV failure.   RHC 08/02/2018 On milrinone 0.125 and NE 14 RA = 13 RV = 51/18 PA = 50/24 (34) PCW = 25 (v = 35) Fick cardiac output/index = 5.42/2.59 Thermo CO/CI = 5.49/2.63 PVR = 1.6 WU FA sat = 97% PA sat = 54%, 58%  Objective:   Weight Range: 72.3 kg Body mass index is 19.92 kg/m.   Vital Signs:   Temp:  [97.5 F (36.4 C)-98 F (36.7 C)] 97.5 F (36.4 C) (02/10 0715) Pulse Rate:  [77-86] 81 (02/10 0810) Resp:  [12-28] 25 (02/10 0810) BP: (79-118)/(55-81) 91/70 (02/10 0810) SpO2:  [90 %-97 %] 90 % (02/10 0810) Weight:  [72.3 kg] 72.3 kg (02/10 0500) Last BM Date: 08/12/18  Weight change: Filed Weights   08/11/18 0751 08/12/18 0500 08/13/18 0500  Weight: 71.8 kg 71.2 kg 72.3 kg   Intake/Output:   Intake/Output Summary (Last 24 hours) at 08/13/2018 0818 Last data filed at 08/13/2018 0800 Gross per 24 hour  Intake 1248.25 ml  Output 747 ml  Net 501.25 ml    Physical Exam   CVP 15.  General: No resp difficulty HEENT: normal Neck: supple. JVP to jaw. Carotids 2+ bilat; no bruits. No lymphadenopathy or thryomegaly appreciated. RIJ HD cath LIJ  Cor: PMI nondisplaced. Regular rate & rhythm. No rubs, gallops or murmurs. Lungs: Decreased on 6 liters HFNC Abdomen: soft, nontender, nondistended. No hepatosplenomegaly. No bruits or masses. Good bowel sounds. Extremities: no cyanosis, clubbing, rash, edema Neuro: alert & orientedx3, cranial nerves grossly intact. moves all 4 extremities w/o difficulty. Affect  pleasant   Telemetry   NSR 80-90s personally reviewed.   Labs    CBC Recent Labs    08/12/18 0238 08/13/18 0309  WBC 9.9 10.5  HGB 9.5* 9.0*  HCT 30.3* 29.8*  MCV 75.8* 75.3*  PLT 106* 98*   Basic Metabolic Panel Recent Labs    08/12/18 0238 08/13/18 0309  NA 130* 130*  K 4.7 5.2*  CL 95* 96*  CO2 21* 18*  GLUCOSE 100* 94  BUN 41* 48*  CREATININE 4.71* 5.00*  CALCIUM 8.0* 7.7*  MG 2.3 2.4  PHOS 3.4 3.9   Liver Function Tests Recent Labs    08/12/18 0238 08/13/18 0309  ALBUMIN 2.3* 2.3*   No results for input(s): LIPASE, AMYLASE in the last 72 hours. Cardiac Enzymes No results for input(s): CKTOTAL, CKMB, CKMBINDEX, TROPONINI in the last 72 hours.  BNP: BNP (last 3 results) Recent Labs    07/22/2018 1651  BNP 4,188.4*    ProBNP (last 3 results) No results for input(s): PROBNP in the last 8760 hours.   D-Dimer No results for input(s): DDIMER in the last 72 hours. Hemoglobin A1C No results for input(s): HGBA1C in the last 72 hours. Fasting Lipid Panel No results for input(s): CHOL, HDL, LDLCALC, TRIG, CHOLHDL, LDLDIRECT in the last 72 hours. Thyroid Function Tests No results for input(s): TSH, T4TOTAL, T3FREE, THYROIDAB in the last 72 hours.  Invalid input(s): FREET3  Other results:   Imaging    No results found.   Medications:     Scheduled Medications: . sodium chloride   Intravenous Once  . amiodarone  200 mg Oral BID  . aspirin EC  81 mg Oral Daily  . atorvastatin  80 mg Oral q1800  . chlorhexidine  15 mL Mouth Rinse BID  . Chlorhexidine Gluconate Cloth  6 each Topical Daily  . darbepoetin (ARANESP) injection - DIALYSIS  200 mcg Intravenous Q Thu-HD  . mouth rinse  15 mL Mouth Rinse BID  . mexiletine  150 mg Oral Q8H  . midodrine  10 mg Oral TID WC  . pantoprazole  40 mg Oral BID  . sodium chloride flush  3 mL Intravenous Q12H    Infusions: .  prismasol BGK 4/2.5 200 mL/hr at 08/12/18 1102  .  prismasol BGK 4/2.5 200  mL/hr at 08/12/18 1102  . sodium chloride    . sodium chloride Stopped (08/13/18 0210)  . sodium chloride    . sodium chloride    . ceFEPime (MAXIPIME) IV Stopped (08/13/18 0159)  . heparin 10,000 units/ 20 mL infusion syringe 1,750 Units/hr (08/13/18 0041)  . norepinephrine (LEVOPHED) Adult infusion 8 mcg/min (08/13/18 0800)  . prismasol BGK 4/2.5 2,000 mL/hr at 08/12/18 2312    PRN Medications: Place/Maintain arterial line **AND** sodium chloride, sodium chloride, sodium chloride, sodium chloride, acetaminophen, heparin, magic mouthwash, nitroGLYCERIN, ondansetron (ZOFRAN) IV, sodium chloride, sodium chloride flush   Patient Profile   62 y.o.malewith past medical history of hypertension, hyperlipidemia admitted with recent but late presenting inferior lateral myocardial infarction, cardiogenic shock, acute renal failure and microcytic anemia. Echocardiogram showed severe LV and RV dysfunction and moderate mitral regurgitation.  Assessment/Plan   1. Acute systolic HF with cardiogenic shock due to OOH MI: Ischemic cardiomyopathy, echo 1/5 EF 15% severe RV dysfunction and moderate MR.   On NE 8 mcg.  No CO-OX. Line clotted.  - Anticipate restarting HD.  - Continue midodrine 10 mg TID.   - Continue to wean NE as able, would like to see SBP > 90 for renal function.  - No ACE/ARB/ARNI or b-blocker with shock and AKI 2. CAD: s/p OOH inferolateral MI. Initial trop 28, then trended down.  No chest pain.   - Continue ASA and statin. No b-blocker with shock.  - If renal recovery looks unlikely, will need coronary angiography.  He is anuric and now on iHD, suspect not going to have effective recovery.  3. AKI/ESRD with uremia: likely due to ATN and hypoperfusion. Initial BUN/CR 198/3.91. Baseline creatinine normal several months ago. Renal following CRRT started on 1/25, now on iHD, first run yesterday.  Creatinine 5. Will need to restart iHD cath . CVP up to 16.  4. Acute hypoxic  respiratory failure: CXR with bilateral interstitial edema - Remains on 6L HFNC. Continue to wean oxygen as tolerated.  5. Microcytic anemia due to IDA - Iron stores low. Has received Feraheme - Will eventually need GI w/u - Continue protonix 6. Shock liver: Improving. No change.  7. VT: Quiescent on amiodarone gtt and PO mexilitene. No further VT.  8. L wrist pain: Suspect gout. Improved.  9. Leukocytosis: Covered for potential PNA with cefepime currently.     Darrick Grinder, NP  8:18 AM   Agree with above.   Remains on NE at 8 and midodrine at 10 tid. Remains anuric. Tolerated first session of iHD yesterday. Perm cath not working today. Will  work on trying to [employer] today.   Respiratory status improved but still not back to baseline. Remains on abx for presumed sepsis last week.   On exam. Sitting up in bed NAD General:  No resp difficulty HEENT: normal Neck: supple. JVP to jaw Carotids 2+ bilat; no bruits. No lymphadenopathy or thryomegaly appreciated. Cor: Regular rate & rhythm. No rubs, gallops or murmurs. + perm cath Lungs: clear Abdomen: soft, nontender, nondistended. No hepatosplenomegaly. No bruits or masses. Good bowel sounds. Extremities: no cyanosis, clubbing, rash, edema Neuro: alert & orientedx3, cranial nerves grossly intact. moves all 4 extremities w/o difficulty. Affect pleasant  Volume status remains elevated. Need iHD but perm cath "clotted" again.  (I suspect this is related to his venous anatomy as we have had issues passing the wire through his SVC system. I did venogram and looked ok). Remains on NE. Will continue to wean as tolerated with SBP >= 90. Chances for renal recovery looking slimmer. Has not had cardiac cath yet. Continue abx for 7-day course.   CRITICAL CARE Performed by: Glori Bickers  Total critical care time: 35 minutes  Critical care time was exclusive of separately billable procedures and treating other patients.  Critical care was  necessary to treat or prevent imminent or life-threatening deterioration.  Critical care was time spent personally by me (independent of midlevel providers or residents) on the following activities: development of treatment plan with patient and/or surrogate as well as nursing, discussions with consultants, evaluation of patient's response to treatment, examination of patient, obtaining history from patient or surrogate, ordering and performing treatments and interventions, ordering and review of laboratory studies, ordering and review of radiographic studies, pulse oximetry and re-evaluation of patient's condition.   Glori Bickers, MD  8:52 AM

## 2018-08-13 NOTE — Progress Notes (Signed)
IV Team consulted for HD catheter to be dwelled with TPA. TPA currently dwelling and to remain for two hours. IV team to reevaluate HD catheter patency and remove TPA dwell at approximately 0745 or after.

## 2018-08-13 NOTE — Progress Notes (Signed)
[facility]    NEPHROLOGY PROGRESS NOTE  SUBJECTIVE: Patient feels much better today.  Denies chest pain, shortness of breath, nausea, vomiting, diarrhea or dysuria.  Continues to have oxygen requirement.  Pressor requirement is decreasing.    OBJECTIVE:  Vitals:   08/13/18 1100 08/13/18 1200  BP: 96/68 97/74  Pulse: 81   Resp: (!) 22 (!) 22  Temp:    SpO2: 93%     Intake/Output Summary (Last 24 hours) at 08/13/2018 1249 Last data filed at 08/13/2018 1200 Gross per 24 hour  Intake 1353.26 ml  Output 747 ml  Net 606.26 ml      [place]:  AAOx3 NAD HEENT: MMM [place] AT anicteric sclera Neck:  (+) JVD, no adenopathy CV:  Heart RRR (+) systolic murmus Lungs:  L/S with fine crackles bilaterally at the bases Abd:  abd SNT/ND with normal BS GU:  Bladder non-palpable Extremities:  No LE edema. Skin:  No skin rash  MEDICATIONS:  . sodium chloride   Intravenous Once  . amiodarone  200 mg Oral BID  . aspirin EC  81 mg Oral Daily  . atorvastatin  80 mg Oral q1800  . chlorhexidine  15 mL Mouth Rinse BID  . Chlorhexidine Gluconate Cloth  6 each Topical Daily  . darbepoetin (ARANESP) injection - DIALYSIS  200 mcg Intravenous Q Thu-HD  . mouth rinse  15 mL Mouth Rinse BID  . mexiletine  150 mg Oral Q8H  . midodrine  10 mg Oral TID WC  . pantoprazole  40 mg Oral BID  . sodium chloride flush  3 mL Intravenous Q12H       LABS:   CBC Latest Ref Rng & Units 08/13/2018 08/12/2018 08/11/2018  WBC 4.0 - 10.5 K/uL 10.5 9.9 11.4(H)  Hemoglobin 13.0 - 17.0 g/dL 9.0(L) 9.5(L) 9.6(L)  Hematocrit 39.0 - 52.0 % 29.8(L) 30.3(L) 32.0(L)  Platelets 150 - 400 K/uL 98(L) 106(L) 110(L)    CMP Latest Ref Rng & Units 08/13/2018 08/12/2018 08/11/2018  Glucose 70 - 99 mg/dL 94 100(H) 95  BUN 8 - 23 mg/dL 48(H) 41(H) 38(H)  Creatinine 0.61 - 1.24 mg/dL 5.00(H) 4.71(H) 4.22(H)  Sodium 135 - 145 mmol/L 130(L) 130(L) 129(L)  Potassium 3.5 - 5.1 mmol/L 5.2(H) 4.7 5.0  Chloride 98 - 111 mmol/L  96(L) 95(L) 97(L)  CO2 22 - 32 mmol/L 18(L) 21(L) 20(L)  Calcium 8.9 - 10.3 mg/dL 7.7(L) 8.0(L) 7.7(L)  Total Protein 6.5 - 8.1 g/dL - - -  Total Bilirubin 0.3 - 1.2 mg/dL - - -  Alkaline Phos 38 - 126 U/L - - -  AST 15 - 41 U/L - - -  ALT 0 - 44 U/L - - -    Lab Results  Component Value Date   CALCIUM 7.7 (L) 08/13/2018   CAION 1.00 (L) 08/02/2018   CAION 1.06 (L) 07/09/2018   PHOS 3.9 08/13/2018       Component Value Date/Time   COLORURINE YELLOW 07/24/2018 2208   APPEARANCEUR HAZY (A) 07/10/2018 2208   LABSPEC 1.013 07/08/2018 2208   PHURINE 5.0 07/31/2018 [place] 07/31/2018 2208   HGBUR NEGATIVE 07/11/2018 2208   BILIRUBINUR NEGATIVE 07/20/2018 [place] 07/14/2018 2208   PROTEINUR NEGATIVE 07/24/2018 2208   NITRITE NEGATIVE 07/07/2018 2208   LEUKOCYTESUR NEGATIVE 07/21/2018 2208      Component Value Date/Time   HCO3 24.0 07/09/2018 0908   HCO3 25.7 07/04/2018 0908   TCO2 25 07/30/2018 0908  TCO2 27 08/02/2018 0908   ACIDBASEDEF 1.0 07/15/2018 0908   O2SAT 36.6 08/13/2018 0900       Component Value Date/Time   IRON 14 (L) 08/01/2018 1651   TIBC 458 (H) 07/05/2018 1651   FERRITIN 28 07/04/2018 1651   IRONPCTSAT 3 (L) 08/02/2018 1651       ASSESSMENT/PLAN:      1. Acute systolic heart failure ejection fraction 15% severe RV dysfunction with cardiogenic shock. CVPs up [place] 1/30 w/ high PCW 25. Still requiring levophed with dose being decreased.  Has had multiple episodes of clotting the circuit.    Will reinitiate CRRT for volume management.  Has minimal urine output  2. Coronary artery disease continues aspirin and statin will need cardiac cath prior to discharge- holding for now in case renal function improves  3. Acute kidney injurycrt 1.34 in August 2019secondary to acute tubular necrosis and hypoperfusion admission creatinine 3.91.Echogenic right kidney compatible medical renal disease. Minimal urine output.     4. Anemia- havestarteddarbepoetin. Completed RBC transfusion 1/26/2020and again 2/4  5. Hypertension/volume. Blood pressure is now low.  Physical exam at substantially improved, with no edema and skin tenting noted.  Chest x-ray has improved.    CVP elevated today.  Will attempt ultrafiltration on hemodialysis.  6.  Leukocytosis- climbing on steroids, cultures drawn and neg,abx, possible aspiration  7.  VT: on amio and mexilitine    [employer], DO, FACP

## 2018-08-13 NOTE — Progress Notes (Signed)
Patient noted with episode of nausea this morning that presented out of no where. No vomiting only dry heaving. PRN Zofran administered as ordered. Nausea dissipated before nausea medicine administered but medication given to prevent further episodes. Patient also noted with four loose bowel movements last night. Bowel movements went from brown and loose (6 on bristol stool scale) to jelly-like and clear.

## 2018-08-13 NOTE — Progress Notes (Signed)
CRRT machine with complications related to self test error. [employer] support line called and advised if error/alarm continued the cartridge would need to be changed. M100 cartridge changed with use of same CRRT machine. With just priming the cartridge with saline the CRRT machine gave self test error/alarm. CRRT machine exchanged but pre-primed cartridge used as to not waste equipment. Further difficulty with CRRT set up due to blood detection sensor unable to calibrate with pre-primed tubing. [employer] support notified and updated again; troubleshooting interventions successful. CRRT cartridge primed and machine passed self test. Upon making connections to HD catheter, red port noted with no blood return but easily flushed. Blue port remained with brisk blood return and easily flushed. CRRT connections made with ports reversed. Red CRRT access connected to blue HD port and vice versa. Despite new cartridge and CRRT running, CRRT alarming began for elevated TMP pressures and elevated negative access pressure alarms. Bedside troubleshooting without success. Blood was able to be returned. On call Nephrologist notified and updated. Orders received to dwell TPA intra-catheter. Orders placed for Vascular Access Team consult and Alteplase for intra-catheter dwell. Will continue to monitor.

## 2018-08-13 NOTE — Progress Notes (Signed)
Initial Nutrition Assessment  DOCUMENTATION CODES:   Severe malnutrition in context of acute illness/injury  INTERVENTION:   - Recommend ordering appetite stimulant if appropriate  - Ensure Enlive po BID, each supplement provides 350 kcal and 20 grams of protein  - Pro-stat 30 ml BID, each supplement provides 100 kcal and 15 grams of protein  - Encourage adequate PO intake  NUTRITION DIAGNOSIS:   Severe Malnutrition related to acute illness (acute systolic heart failure with cardiogenic shock) as evidenced by moderate fat depletion, moderate muscle depletion.  GOAL:   Patient will meet greater than or equal to 90% of their needs  MONITOR:   PO intake, Supplement acceptance, Weight trends, I & O's, Labs  REASON FOR ASSESSMENT:   LOS    ASSESSMENT:   63 year old male who presented to the ED on 1/25 with SOB. PMH significant for HTN, HLD. Pt found to have acute systolic CHF likely from recent MI and AKI.   1/25 - CRRT initiated 1/29 - s/p IJ temp HD cath placement 2/2 - HD cath replaced d/t clotting 2/4 - s/p tunneled cath placement by IR 2/7 - CRRT d/c 2/8 - first HD 2/9 - CRRT reinitiated  Pt on CRRT at time of visit. RN in room providing nursing care. Pt and RN report pt has been nauseous and just experienced episode of emesis.  Spoke with pt at bedside. Pt reports that his appetite is poor. RN states that pt wants to eat but that when he is faced with food, he just has no appetite. Pt shares that the antibiotics he is taking have changed his appetite such that sweeter foods do not taste good. Pt willing to try an Ensure Enlive oral nutrition supplement. RD provided pt with one at time of visit and pt took a few sips. Pt states that he does not like it but that he will drink it because he knows he needs to. Pt also amenable to receiving Pro-stat.  Pt shares that his appetite and PO intake were good PTA "as long as I could add salt and pepper." Pt endorses weight loss  but is unsure whether it is just fluid or true weight loss as pt had extensive BLE edema on admission per his report. Pt shares that his UBW is 195-200 lbs.  Weight down total of 21 lbs since admission likely related to fluid status given pt on CRRT with -16.4 L since admit.  Per weight history in chart, pt weight down 19.8 kg since 04/09/18. This is a 21.5% weight loss which is significant for timeframe. Suspect a large portion of this weight loss is related to fluid loss during admission given 9.7 kg of weight loss has occurred since admission.  Discussed the importance of adequate kcal and protein intake in acute illness and increased needs due to CRRT. Pt expresses understanding.  CRRT: 746 ml fluid removed x 24 hours, 1189 ml removed so far today I/O's: -16.4 L since admit  Meal Completion: 25-100% x last 8 recorded meals  Medications reviewed and include: Protonix, IV antibiotics, heparin, Levophed @ 7.5 ml/hr  Labs reviewed: sodium 132 (L), hemoglobin 9.0 (L)  NUTRITION - FOCUSED PHYSICAL EXAM:    Most Recent Value  Orbital Region  Moderate depletion  Upper Arm Region  Moderate depletion  Thoracic and Lumbar Region  Moderate depletion  Buccal Region  Moderate depletion  Temple Region  Mild depletion  Clavicle Bone Region  Moderate depletion  Clavicle and Acromion Bone Region  Moderate depletion  Scapular Bone Region  Moderate depletion  Dorsal Hand  Mild depletion  Patellar Region  Moderate depletion  Anterior Thigh Region  Moderate depletion  Posterior Calf Region  Moderate depletion  Edema (RD Assessment)  None  Hair  Reviewed  Eyes  Reviewed  Mouth  Reviewed  Skin  Reviewed  Nails  Reviewed       Diet Order:   Diet Order            Diet renal with fluid restriction Fluid restriction: 1200 mL Fluid; Room service appropriate? Yes; Fluid consistency: Thin  Diet effective now              EDUCATION NEEDS:   Education needs have been addressed  Skin:  Skin  Assessment: Reviewed RN Assessment  Last BM:  2/10  Height:   Ht Readings from Last 1 Encounters:  07/29/2018 6\' 3"  (1.905 m)    Weight:   Wt Readings from Last 1 Encounters:  08/13/18 72.3 kg    Ideal Body Weight:  89.1 kg  BMI:  Body mass index is 19.92 kg/m.  Estimated Nutritional Needs:   Kcal:  2400-2600  Protein:  115-130 grams  Fluid:  per MD    Gaynell Face, MS, RD, LDN Inpatient Clinical Dietitian Pager: [phone] Weekend/After Hours: [phone]

## 2018-08-14 DIAGNOSIS — E43 Unspecified severe protein-calorie malnutrition: Secondary | ICD-10-CM

## 2018-08-14 LAB — POCT ACTIVATED CLOTTING TIME
Activated Clotting Time: 180 seconds
Activated Clotting Time: 197 seconds
Activated Clotting Time: 202 seconds
Activated Clotting Time: 208 seconds
Activated Clotting Time: 202 seconds
Activated Clotting Time: 158 seconds
Activated Clotting Time: 158 seconds
Activated Clotting Time: 175 seconds
Activated Clotting Time: 175 seconds
Activated Clotting Time: 186 seconds
Activated Clotting Time: 224 seconds
Activated Clotting Time: 246 seconds
Activated Clotting Time: 257 seconds
Activated Clotting Time: 257 seconds

## 2018-08-14 LAB — COOXEMETRY PANEL
Carboxyhemoglobin: 1.1 % (ref 0.5–1.5)
Carboxyhemoglobin: 1.4 % (ref 0.5–1.5)
Carboxyhemoglobin: 1.4 % (ref 0.5–1.5)
Methemoglobin: 1.7 % — ABNORMAL HIGH (ref 0.0–1.5)
Methemoglobin: 1.1 % (ref 0.0–1.5)
Methemoglobin: 1.5 % (ref 0.0–1.5)
O2 Saturation: 26 %
O2 Saturation: 35.6 %
O2 Saturation: 25.6 %
TOTAL HEMOGLOBIN: 9.9 g/dL — AB (ref 12.0–16.0)
TOTAL HEMOGLOBIN: 10 g/dL — AB (ref 12.0–16.0)
Total hemoglobin: 9.6 g/dL — ABNORMAL LOW (ref 12.0–16.0)

## 2018-08-14 LAB — CBC
HCT: 32.7 % — ABNORMAL LOW (ref 39.0–52.0)
Hemoglobin: 9.9 g/dL — ABNORMAL LOW (ref 13.0–17.0)
MCH: 23.1 pg — AB (ref 26.0–34.0)
MCHC: 30.3 g/dL (ref 30.0–36.0)
MCV: 76.2 fL — ABNORMAL LOW (ref 80.0–100.0)
Platelets: 104 10*3/uL — ABNORMAL LOW (ref 150–400)
RBC: 4.29 MIL/uL (ref 4.22–5.81)
RDW: 29.8 % — ABNORMAL HIGH (ref 11.5–15.5)
WBC: 11.1 10*3/uL — ABNORMAL HIGH (ref 4.0–10.5)
nRBC: 57.6 % — ABNORMAL HIGH (ref 0.0–0.2)

## 2018-08-14 LAB — RENAL FUNCTION PANEL
Albumin: 2.4 g/dL — ABNORMAL LOW (ref 3.5–5.0)
Anion gap: 14 (ref 5–15)
BUN: 39 mg/dL — AB (ref 8–23)
CO2: 22 mmol/L (ref 22–32)
Calcium: 7.8 mg/dL — ABNORMAL LOW (ref 8.9–10.3)
Chloride: 100 mmol/L (ref 98–111)
Creatinine, Ser: 3.69 mg/dL — ABNORMAL HIGH (ref 0.61–1.24)
GFR calc Af Amer: 19 mL/min — ABNORMAL LOW (ref >60)
GFR calc non Af Amer: 17 mL/min — ABNORMAL LOW (ref >60)
Glucose, Bld: 120 mg/dL — ABNORMAL HIGH (ref 70–99)
Phosphorus: 3.8 mg/dL (ref 2.5–4.6)
Potassium: 5.3 mmol/L — ABNORMAL HIGH (ref 3.5–5.1)
Sodium: 136 mmol/L (ref 135–145)

## 2018-08-14 LAB — MAGNESIUM: Magnesium: 2.7 mg/dL — ABNORMAL HIGH (ref 1.7–2.4)

## 2018-08-14 LAB — APTT
APTT: 198 s — AB (ref 24–36)
aPTT: 89 seconds — ABNORMAL HIGH (ref 24–36)

## 2018-08-14 LAB — LACTIC ACID, PLASMA: Lactic Acid, Venous: 1.8 mmol/L (ref 0.5–1.9)

## 2018-08-14 MED ORDER — ALTEPLASE 2 MG IJ SOLR
2.0000 mg | Freq: Once | INTRAMUSCULAR | Status: AC
  Administered 2018-08-14: 2 mg
  Filled 2018-08-14: qty 2

## 2018-08-14 MED ORDER — LOPERAMIDE HCL 1 MG/7.5ML PO SUSP
2.0000 mg | ORAL | Status: DC | PRN
  Administered 2018-08-14: 4 mg via ORAL
  Filled 2018-08-14 (×3): qty 15

## 2018-08-14 MED ORDER — MIDODRINE HCL 5 MG PO TABS
15.0000 mg | ORAL_TABLET | Freq: Three times a day (TID) | ORAL | Status: DC
  Administered 2018-08-14 – 2018-08-20 (×15): 15 mg via ORAL
  Filled 2018-08-14 (×17): qty 3

## 2018-08-14 MED ORDER — DOBUTAMINE IN D5W 4-5 MG/ML-% IV SOLN
2.5000 ug/kg/min | INTRAVENOUS | Status: DC
  Administered 2018-08-14 – 2018-08-18 (×3): 2.5 ug/kg/min via INTRAVENOUS
  Filled 2018-08-14 (×2): qty 250

## 2018-08-14 NOTE — Progress Notes (Addendum)
Patient ID: Samuel Bates, male   DOB: 1955/09/06, 63 y.o.   MRN: [id number]     Advanced Heart Failure Rounding Note  PCP-Cardiologist: No primary care provider on file.   Subjective:    Remains on CRRT.   Weight coming down but CVP still 14-15. Remains anuric. NE up to 13-14 + midodrine 10 tid SBPs 80-low 90s. Co-ox 26% (?) Denies CP or SOB. No orthopnea or PND  Echo 07/25/2018 LVEF 15%, with severe RV failure.   RHC 08/02/2018 On milrinone 0.125 and NE 14 RA = 13 RV = 51/18 PA = 50/24 (34) PCW = 25 (v = 35) Fick cardiac output/index = 5.42/2.59 Thermo CO/CI = 5.49/2.63 PVR = 1.6 WU FA sat = 97% PA sat = 54%, 58%  Objective:   Weight Range: 67.7 kg Body mass index is 18.66 kg/m.   Vital Signs:   Temp:  [97.3 F (36.3 C)-97.6 F (36.4 C)] 97.6 F (36.4 C) (02/11 0400) Pulse Rate:  [71-84] 80 (02/11 0000) Resp:  [11-28] 28 (02/11 0630) BP: (80-97)/(63-77) 91/70 (02/11 0630) SpO2:  [89 %-99 %] 99 % (02/11 0600) Weight:  [67.7 kg] 67.7 kg (02/11 0500) Last BM Date: 08/13/18  Weight change: Filed Weights   08/12/18 0500 08/13/18 0500 08/14/18 0500  Weight: 71.2 kg 72.3 kg 67.7 kg   Intake/Output:   Intake/Output Summary (Last 24 hours) at 08/14/2018 0707 Last data filed at 08/14/2018 0700 Gross per 24 hour  Intake 1429.47 ml  Output 2949 ml  Net -1519.53 ml    Physical Exam    General:  Lying in bed  No resp difficulty HEENT: normal Neck: supple.  JVP to jaw Carotids 2+ bilat; no bruits. No lymphadenopathy or thryomegaly appreciated. Cor: PMI nondisplaced. Regular rate & rhythm. Tunneled cath No rubs, gallops or murmurs. Lungs: clear Abdomen: soft, nontender, nondistended. No hepatosplenomegaly. No bruits or masses. Good bowel sounds. Extremities: no cyanosis, clubbing, rash, tr edema Neuro: alert & orientedx3, cranial nerves grossly intact. moves all 4 extremities w/o difficulty. Affect pleasant   Telemetry   NSR 80s personally reviewed.   Labs      CBC Recent Labs    08/12/18 0238 08/13/18 0309  WBC 9.9 10.5  HGB 9.5* 9.0*  HCT 30.3* 29.8*  MCV 75.8* 75.3*  PLT 106* 98*   Basic Metabolic Panel Recent Labs    08/13/18 0309 08/13/18 1446 08/14/18 0544  NA 130* 132* 136  K 5.2* 5.1 5.3*  CL 96* 98 100  CO2 18* 17* 22  GLUCOSE 94 108* 120*  BUN 48* 40* 39*  CREATININE 5.00* 3.72* 3.69*  CALCIUM 7.7* 7.6* 7.8*  MG 2.4  --  2.7*  PHOS 3.9  --  3.8   Liver Function Tests Recent Labs    08/13/18 0309 08/14/18 0544  ALBUMIN 2.3* 2.4*   No results for input(s): LIPASE, AMYLASE in the last 72 hours. Cardiac Enzymes No results for input(s): CKTOTAL, CKMB, CKMBINDEX, TROPONINI in the last 72 hours.  BNP: BNP (last 3 results) Recent Labs    07/25/2018 1651  BNP 4,188.4*    ProBNP (last 3 results) No results for input(s): PROBNP in the last 8760 hours.   D-Dimer No results for input(s): DDIMER in the last 72 hours. Hemoglobin A1C No results for input(s): HGBA1C in the last 72 hours. Fasting Lipid Panel No results for input(s): CHOL, HDL, LDLCALC, TRIG, CHOLHDL, LDLDIRECT in the last 72 hours. Thyroid Function Tests No results for input(s): TSH, T4TOTAL, T3FREE, THYROIDAB in the  last 72 hours.  Invalid input(s): FREET3  Other results:   Imaging    No results found.   Medications:     Scheduled Medications: . sodium chloride   Intravenous Once  . amiodarone  200 mg Oral BID  . aspirin EC  81 mg Oral Daily  . atorvastatin  80 mg Oral q1800  . chlorhexidine  15 mL Mouth Rinse BID  . Chlorhexidine Gluconate Cloth  6 each Topical Daily  . darbepoetin (ARANESP) injection - DIALYSIS  200 mcg Intravenous Q Thu-HD  . feeding supplement (ENSURE ENLIVE)  237 mL Oral BID BM  . feeding supplement (PRO-STAT SUGAR FREE 64)  30 mL Oral BID  . mouth rinse  15 mL Mouth Rinse BID  . mexiletine  150 mg Oral Q8H  . midodrine  10 mg Oral TID WC  . pantoprazole  40 mg Oral BID  . sodium chloride flush  3 mL  Intravenous Q12H    Infusions: .  prismasol BGK 4/2.5 200 mL/hr at 08/14/18 0000  .  prismasol BGK 4/2.5 200 mL/hr at 08/14/18 0152  . sodium chloride    . sodium chloride 10 mL/hr at 08/14/18 0700  . sodium chloride    . sodium chloride    . ceFEPime (MAXIPIME) IV Stopped (08/14/18 0228)  . heparin 10,000 units/ 20 mL infusion syringe 2,150 Units/hr (08/14/18 0600)  . norepinephrine (LEVOPHED) Adult infusion 12 mcg/min (08/14/18 0700)  . prismasol BGK 4/2.5 2,000 mL/hr at 08/14/18 0502    PRN Medications: Place/Maintain arterial line **AND** sodium chloride, sodium chloride, sodium chloride, sodium chloride, acetaminophen, heparin, magic mouthwash, nitroGLYCERIN, ondansetron (ZOFRAN) IV, sodium chloride, sodium chloride flush   Patient Profile   63 y.o.malewith past medical history of hypertension, hyperlipidemia admitted with recent but late presenting inferior lateral myocardial infarction, cardiogenic shock, acute renal failure and microcytic anemia. Echocardiogram showed severe LV and RV dysfunction and moderate mitral regurgitation.  Assessment/Plan   1. Acute systolic HF with cardiogenic shock due to OOH MI: Ischemic cardiomyopathy, echo 1/5 EF 15% severe RV dysfunction and moderate MR.   - Remains stuck on NE at 13-14 mcg to support CVVHD.  - Co-ox 26% but this does not seem accurate. He clinically does not look that bad. I have asked the RN to page [place] when filter changed on CRRT and we can redraw personally - Continue midodrine 10 mg TID.   - Continue to wean NE as able, would like to see SBP > 90 for renal function.  - No ACE/ARB/ARNI or b-blocker with shock and AKI 2. CAD: s/p OOH inferolateral MI. Initial trop 28, then trended down.  No chest pain.   - Continue ASA and statin. No b-blocker with shock.  - If renal recovery looks unlikely, will need coronary angiography.  He is anuric and now on iHD, suspect not going to have effective recovery.  3. AKI/ESRD with  uremia: likely due to ATN and hypoperfusion. Initial BUN/CR 198/3.91. Baseline creatinine normal several months ago. Renal following CRRT started on 1/25, now on iHD, first run yesterday.  Creatinine 5. Will need to restart iHD cath . CVP up to 16.  4. Acute hypoxic respiratory failure: CXR with bilateral interstitial edema - Remains on 6L HFNC. Continue to wean oxygen as tolerated.  5. Microcytic anemia due to IDA - Iron stores low. Has received Feraheme - Will eventually need GI w/u - Continue protonix 6. Shock liver: Improving. No change.  7. VT: Quiescent on amiodarone gtt and PO mexilitene.  No further VT.  8. L wrist pain: Suspect gout. Improved.  9. Leukocytosis: Covered for potential PNA with cefepime currently.     He is making little or no progress. No evidence of renal recovery despite pressor support and relatively normal renal function a few months ago. Remains on NE to support BP. Co-ox low but I am not sure it is accurate.   Will need to discuss plans further with renal to see if we think he has reached ESRD. If so, will need cath to see if we can do anything to optimize cardiac function.   May need to trial weaning pressors to see if he will tolerate iHD off NE. Increase midodrine to 15 tid.   CRITICAL CARE Performed by: Glori Bickers  Total critical care time: 35 minutes  Critical care time was exclusive of separately billable procedures and treating other patients.  Critical care was necessary to treat or prevent imminent or life-threatening deterioration.  Critical care was time spent personally by me (independent of midlevel providers or residents) on the following activities: development of treatment plan with patient and/or surrogate as well as nursing, discussions with consultants, evaluation of patient's response to treatment, examination of patient, obtaining history from patient or surrogate, ordering and performing treatments and interventions, ordering and  review of laboratory studies, ordering and review of radiographic studies, pulse oximetry and re-evaluation of patient's condition.    Glori Bickers, MD  7:07 AM

## 2018-08-14 NOTE — Progress Notes (Signed)
Complaining nausea.   CO-OX remains 35.6% on norepi 70mcg. Recurrent cardiogenic shock. Options are limited.   Add 2.5 mcg dobutamine now. Recheck CO-OX this afternoon.   Amy Clegg NP-C  11:49 AM

## 2018-08-14 NOTE — Progress Notes (Signed)
[facility]    NEPHROLOGY PROGRESS NOTE  SUBJECTIVE: Patient feels poorly today.  Nausea and diarrhea are main issue.  Afebrile.  Remains on NE 25mcg gtt.  CRRT running.  Net I/Os yesterday -1.7L.  CVP 14 this AM   OBJECTIVE:  Vitals:   08/14/18 0800 08/14/18 0818  BP: 90/61   Pulse: 85   Resp: 18   Temp:  99.1 F (37.3 C)  SpO2: 97%     Intake/Output Summary (Last 24 hours) at 08/14/2018 0831 Last data filed at 08/14/2018 0800 Gross per 24 hour  Intake 1317.85 ml  Output 3216 ml  Net -1898.15 ml      Genearl:  AAOx3 but vomiting HEENT: MMM [place] AT anicteric sclera CV:  Heart RRR (+) systolic murmus Lungs:  L/S with fine crackles bilaterally at the bases Abd:  abd SNT/ND with normal BS GU:  Bladder non-palpable Extremities:  No LE edema. Skin:  No skin rash  MEDICATIONS:  . sodium chloride   Intravenous Once  . amiodarone  200 mg Oral BID  . aspirin EC  81 mg Oral Daily  . atorvastatin  80 mg Oral q1800  . chlorhexidine  15 mL Mouth Rinse BID  . Chlorhexidine Gluconate Cloth  6 each Topical Daily  . darbepoetin (ARANESP) injection - DIALYSIS  200 mcg Intravenous Q Thu-HD  . feeding supplement (ENSURE ENLIVE)  237 mL Oral BID BM  . feeding supplement (PRO-STAT SUGAR FREE 64)  30 mL Oral BID  . mouth rinse  15 mL Mouth Rinse BID  . mexiletine  150 mg Oral Q8H  . midodrine  15 mg Oral TID WC  . pantoprazole  40 mg Oral BID  . sodium chloride flush  3 mL Intravenous Q12H       LABS:   CBC Latest Ref Rng & Units 08/14/2018 08/13/2018 08/12/2018  WBC 4.0 - 10.5 K/uL 11.1(H) 10.5 9.9  Hemoglobin 13.0 - 17.0 g/dL 9.9(L) 9.0(L) 9.5(L)  Hematocrit 39.0 - 52.0 % 32.7(L) 29.8(L) 30.3(L)  Platelets 150 - 400 K/uL 104(L) 98(L) 106(L)    CMP Latest Ref Rng & Units 08/14/2018 08/13/2018 08/13/2018  Glucose 70 - 99 mg/dL 120(H) 108(H) 94  BUN 8 - 23 mg/dL 39(H) 40(H) 48(H)  Creatinine 0.61 - 1.24 mg/dL 3.69(H) 3.72(H) 5.00(H)  Sodium 135 - 145 mmol/L 136  132(L) 130(L)  Potassium 3.5 - 5.1 mmol/L 5.3(H) 5.1 5.2(H)  Chloride 98 - 111 mmol/L 100 98 96(L)  CO2 22 - 32 mmol/L 22 17(L) 18(L)  Calcium 8.9 - 10.3 mg/dL 7.8(L) 7.6(L) 7.7(L)  Total Protein 6.5 - 8.1 g/dL - - -  Total Bilirubin 0.3 - 1.2 mg/dL - - -  Alkaline Phos 38 - 126 U/L - - -  AST 15 - 41 U/L - - -  ALT 0 - 44 U/L - - -    Lab Results  Component Value Date   CALCIUM 7.8 (L) 08/14/2018   CAION 1.00 (L) 07/09/2018   CAION 1.06 (L) 07/11/2018   PHOS 3.8 08/14/2018       Component Value Date/Time   COLORURINE YELLOW 07/10/2018 2208   APPEARANCEUR HAZY (A) 08/01/2018 2208   LABSPEC 1.013 07/13/2018 2208   PHURINE 5.0 07/11/2018 [place] 07/22/2018 [place] 07/14/2018 [place] 07/29/2018 [place] 07/23/2018 2208   PROTEINUR NEGATIVE 07/17/2018 2208   NITRITE NEGATIVE 07/17/2018 2208   LEUKOCYTESUR NEGATIVE 07/22/2018 2208  Component Value Date/Time   HCO3 24.0 07/13/2018 0908   HCO3 25.7 07/17/2018 0908   TCO2 25 07/23/2018 0908   TCO2 27 07/09/2018 0908   ACIDBASEDEF 1.0 07/14/2018 0908   O2SAT 26.0 08/14/2018 0539       Component Value Date/Time   IRON 14 (L) 07/31/2018 1651   TIBC 458 (H) 07/22/2018 1651   FERRITIN 28 07/22/2018 1651   IRONPCTSAT 3 (L) 07/26/2018 1651       ASSESSMENT/PLAN:      1. Acute systolic heart failure ejection fraction 15% severe RV dysfunction with cardiogenic shock. CVPs up still. RHC 1/30 w/ high PCW 25. Still requiring levophed today.  Has had multiple episodes of clotting the circuit.    Tdoay cont CRRT for volume management.  Has minimal urine output  2. Coronary artery disease continues aspirin and statin will need cardiac cath prior to discharge- holding for now in case renal function improves  3. Acute kidney injurycrt 1.34 in August 2019secondary to acute tubular necrosis and hypoperfusion admission creatinine 3.91.Echogenic right kidney  compatible medical renal disease. Minimal urine output.    4. Anemia- havestarteddarbepoetin. Completed RBC transfusion 1/26/2020and again 2/4.  5. Hypertension/volume. Blood pressure is now low.  Physical exam at substantially improved, with no edema and skin tenting noted.  Chest x-ray has improved.    CVP elevated today.  Cont CRRT today.  6.  Leukocytosis- stable, cultures drawn and neg,abx, possible aspiration  7.  VT: on amio and mexilitine    Jannifer Hick MD [facility]

## 2018-08-14 NOTE — Progress Notes (Signed)
CRRT machine continually reading "high pressures". Additional clinical staff brought in to assess machine and any possible obstructions. No external cause was found. Blood was able to be returned. However, lumen are very sluggish when blood is drawn back. Both lumens are now heparin locked. IV team paged immediately after RN assessed lumen status for declotting. Dr. Johnney Ou with Nephrology was notified that this is a recurrent attempt at declotting. She approved TPA installment. IV team has now arrived for declotting. Will resume CRRT after TPA is removed.

## 2018-08-15 DIAGNOSIS — Z515 Encounter for palliative care: Secondary | ICD-10-CM

## 2018-08-15 DIAGNOSIS — I249 Acute ischemic heart disease, unspecified: Secondary | ICD-10-CM

## 2018-08-15 LAB — RENAL FUNCTION PANEL
Albumin: 2.4 g/dL — ABNORMAL LOW (ref 3.5–5.0)
Anion gap: 12 (ref 5–15)
BUN: 38 mg/dL — ABNORMAL HIGH (ref 8–23)
CALCIUM: 7.7 mg/dL — AB (ref 8.9–10.3)
CO2: 26 mmol/L (ref 22–32)
Chloride: 97 mmol/L — ABNORMAL LOW (ref 98–111)
Creatinine, Ser: 3.53 mg/dL — ABNORMAL HIGH (ref 0.61–1.24)
GFR calc Af Amer: 20 mL/min — ABNORMAL LOW (ref >60)
GFR calc non Af Amer: 17 mL/min — ABNORMAL LOW (ref >60)
Glucose, Bld: 125 mg/dL — ABNORMAL HIGH (ref 70–99)
Phosphorus: 2.6 mg/dL (ref 2.5–4.6)
Potassium: 5 mmol/L (ref 3.5–5.1)
Sodium: 135 mmol/L (ref 135–145)

## 2018-08-15 LAB — CBC
HCT: 31.3 % — ABNORMAL LOW (ref 39.0–52.0)
Hemoglobin: 9.2 g/dL — ABNORMAL LOW (ref 13.0–17.0)
MCH: 22.8 pg — ABNORMAL LOW (ref 26.0–34.0)
MCHC: 29.4 g/dL — ABNORMAL LOW (ref 30.0–36.0)
MCV: 77.5 fL — ABNORMAL LOW (ref 80.0–100.0)
Platelets: 89 10*3/uL — ABNORMAL LOW (ref 150–400)
RBC: 4.04 MIL/uL — AB (ref 4.22–5.81)
RDW: 30.2 % — ABNORMAL HIGH (ref 11.5–15.5)
WBC: 11.9 10*3/uL — ABNORMAL HIGH (ref 4.0–10.5)
nRBC: 64.4 % — ABNORMAL HIGH (ref 0.0–0.2)

## 2018-08-15 LAB — POCT ACTIVATED CLOTTING TIME
ACTIVATED CLOTTING TIME: 202 s
Activated Clotting Time: 186 seconds
Activated Clotting Time: 191 seconds
Activated Clotting Time: 197 seconds
Activated Clotting Time: 197 seconds
Activated Clotting Time: 202 seconds
Activated Clotting Time: 208 seconds
Activated Clotting Time: 208 seconds
Activated Clotting Time: 208 seconds
Activated Clotting Time: 230 seconds
Activated Clotting Time: 230 seconds
Activated Clotting Time: 230 seconds
Activated Clotting Time: 235 seconds
Activated Clotting Time: 241 seconds
Activated Clotting Time: 252 seconds
Activated Clotting Time: 268 seconds

## 2018-08-15 LAB — COOXEMETRY PANEL
Carboxyhemoglobin: 1.5 % (ref 0.5–1.5)
Methemoglobin: 1.3 % (ref 0.0–1.5)
O2 Saturation: 39 %
Total hemoglobin: 9.6 g/dL — ABNORMAL LOW (ref 12.0–16.0)

## 2018-08-15 LAB — MAGNESIUM: Magnesium: 2.7 mg/dL — ABNORMAL HIGH (ref 1.7–2.4)

## 2018-08-15 NOTE — Progress Notes (Signed)
Paged md Akhter concerning pt's SBP in the 80s. Pt BP now 87/70 with a MAP of 77. Pt is on max Levo at 40 mcg. No new orders given. Will continue to monitor pt closely this shift.

## 2018-08-15 NOTE — Progress Notes (Addendum)
Patient ID: Lenord Fralix, male   DOB: [birth year], 63 y.o.   MRN: [id number]     Advanced Heart Failure Rounding Note  PCP-Cardiologist: No primary care provider on file.   Subjective:    Remains on CRRT.   Yesterday he was started on dobutamine 2.5 mcg with low CO-OX. Over night norepi was increased to 40 mcg. SBP remain in the 80s. CO-OX 39%.   Feels terrible. Denies SOB.    Echo 07/29/2018 LVEF 15%, with severe RV failure.   RHC 08/02/2018 On milrinone 0.125 and NE 14 RA = 13 RV = 51/18 PA = 50/24 (34) PCW = 25 (v = 35) Fick cardiac output/index = 5.42/2.59 Thermo CO/CI = 5.49/2.63 PVR = 1.6 WU FA sat = 97% PA sat = 54%, 58%  Objective:   Weight Range: 65.6 kg Body mass index is 18.08 kg/m.   Vital Signs:   Temp:  [96.5 F (35.8 C)-98 F (36.7 C)] 97.5 F (36.4 C) (02/12 0833) Pulse Rate:  [79-88] 79 (02/11 1630) Resp:  [11-32] 20 (02/12 0830) BP: (83-117)/(51-86) 89/72 (02/12 0830) SpO2:  [48 %-100 %] 100 % (02/12 0700) Weight:  [65.6 kg] 65.6 kg (02/12 0545) Last BM Date: 08/15/18  Weight change: Filed Weights   08/13/18 0500 08/14/18 0500 08/15/18 0545  Weight: 72.3 kg 67.7 kg 65.6 kg   Intake/Output:   Intake/Output Summary (Last 24 hours) at 08/15/2018 0849 Last data filed at 08/15/2018 0800 Gross per 24 hour  Intake 1694.56 ml  Output 2868 ml  Net -1173.44 ml    Physical Exam   CVP 6-8  General:  Appears chronically ill.  HEENT: normal Neck: supple. Prominent CV waves.  Carotids 2+ bilat; no bruits. No lymphadenopathy or thryomegaly appreciated. Cor: PMI nondisplaced. Regular rate & rhythm. No rubs, gallops or murmurs. Lungs: clear on 4 liters oxygen. Abdomen: soft, nontender, nondistended. No hepatosplenomegaly. No bruits or masses. Good bowel sounds. Extremities: no cyanosis, clubbing, rash, edema.  Neuro: alert & orientedx3, cranial nerves grossly intact. moves all 4 extremities w/o difficulty. Affect pleasant   Telemetry   SR 80s  personally reviewed.  Labs    CBC Recent Labs    08/13/18 0309 08/14/18 0544  WBC 10.5 11.1*  HGB 9.0* 9.9*  HCT 29.8* 32.7*  MCV 75.3* 76.2*  PLT 98* 678*   Basic Metabolic Panel Recent Labs    08/14/18 0544 08/15/18 0430  NA 136 135  K 5.3* 5.0  CL 100 97*  CO2 22 26  GLUCOSE 120* 125*  BUN 39* 38*  CREATININE 3.69* 3.53*  CALCIUM 7.8* 7.7*  MG 2.7* 2.7*  PHOS 3.8 2.6   Liver Function Tests Recent Labs    08/14/18 0544 08/15/18 0430  ALBUMIN 2.4* 2.4*   No results for input(s): LIPASE, AMYLASE in the last 72 hours. Cardiac Enzymes No results for input(s): CKTOTAL, CKMB, CKMBINDEX, TROPONINI in the last 72 hours.  BNP: BNP (last 3 results) Recent Labs    07/21/2018 1651  BNP 4,188.4*    ProBNP (last 3 results) No results for input(s): PROBNP in the last 8760 hours.   D-Dimer No results for input(s): DDIMER in the last 72 hours. Hemoglobin A1C No results for input(s): HGBA1C in the last 72 hours. Fasting Lipid Panel No results for input(s): CHOL, HDL, LDLCALC, TRIG, CHOLHDL, LDLDIRECT in the last 72 hours. Thyroid Function Tests No results for input(s): TSH, T4TOTAL, T3FREE, THYROIDAB in the last 72 hours.  Invalid input(s): FREET3  Other results:   Imaging  No results found.   Medications:     Scheduled Medications: . sodium chloride   Intravenous Once  . amiodarone  200 mg Oral BID  . aspirin EC  81 mg Oral Daily  . atorvastatin  80 mg Oral q1800  . chlorhexidine  15 mL Mouth Rinse BID  . Chlorhexidine Gluconate Cloth  6 each Topical Daily  . darbepoetin (ARANESP) injection - DIALYSIS  200 mcg Intravenous Q Thu-HD  . feeding supplement (ENSURE ENLIVE)  237 mL Oral BID BM  . feeding supplement (PRO-STAT SUGAR FREE 64)  30 mL Oral BID  . mouth rinse  15 mL Mouth Rinse BID  . mexiletine  150 mg Oral Q8H  . midodrine  15 mg Oral TID WC  . pantoprazole  40 mg Oral BID  . sodium chloride flush  3 mL Intravenous Q12H     Infusions: .  prismasol BGK 4/2.5 200 mL/hr at 08/14/18 0000  .  prismasol BGK 4/2.5 200 mL/hr at 08/14/18 0152  . sodium chloride    . sodium chloride Stopped (08/14/18 0727)  . sodium chloride    . sodium chloride    . DOBUTamine 2.5 mcg/kg/min (08/15/18 0800)  . heparin 10,000 units/ 20 mL infusion syringe 1,600 Units/hr (08/15/18 0703)  . norepinephrine (LEVOPHED) Adult infusion 40 mcg/min (08/15/18 0828)  . prismasol BGK 4/2.5 2,000 mL/hr at 08/15/18 0819    PRN Medications: Place/Maintain arterial line **AND** sodium chloride, sodium chloride, sodium chloride, sodium chloride, acetaminophen, heparin, loperamide HCl, magic mouthwash, nitroGLYCERIN, ondansetron (ZOFRAN) IV, sodium chloride, sodium chloride flush   Patient Profile   62 y.o.malewith past medical history of hypertension, hyperlipidemia admitted with recent but late presenting inferior lateral myocardial infarction, cardiogenic shock, acute renal failure and microcytic anemia. Echocardiogram showed severe LV and RV dysfunction and moderate mitral regurgitation.  Assessment/Plan   1. Acute systolic HF with cardiogenic shock due to OOH MI: Ischemic cardiomyopathy, echo 1/5 EF 15% severe RV dysfunction and moderate MR.   - Now on max norepi 40 mcg + dobutamine 2.5 mcg + midodrine 15 mg tid.  - CO-OX remains in the 30s. - No ACE/ARB/ARNI or b-blocker with shock and AKI 2. CAD: s/p OOH inferolateral MI. Initial trop 28, then trended down.  No chest pain.   - Continue ASA and statin. No b-blocker with shock.  - If renal recovery looks unlikely, will need coronary angiography.  He is anuric and now on iHD, suspect not going to have effective recovery.  3. AKI/ESRD with uremia: likely due to ATN and hypoperfusion. Initial BUN/CR 198/3.91. Baseline creatinine normal several months ago. Renal following CRRT started on 1/25, now on CVVHD. Anuric  CVP 6-8.  Should be able to run him even.  4. Acute hypoxic respiratory  failure: CXR with bilateral interstitial edema - Remains on 4L HFNC. Continue to wean oxygen as tolerated.  5. Microcytic anemia due to IDA - Iron stores low. Has received Feraheme - Will eventually need GI w/u - Continue protonix 6. Shock liver: Improving. No change.  7. VT: Quiescent on amiodarone gtt and PO mexilitene. No further VT.  8. L wrist pain: Suspect gout. Improved.  9. Leukocytosis: Covered for potential PNA with cefepime currently.      Briefly discussed GOC. Needs palliative care consult.   Amy Clegg NP-C  9:13 AM  Agree with above.  He remains on CRRT without any evidence of renal recovery. Feels nauseated and weak. On dobutamine and NE with escalating pressor requirements. Co-ox in the  30s despite pressor support. CVP 12  Jvp to jaw Cor perm cath in place RRR prominent s3 Lungs mild crackles Ab soft NT Ext cool no edema  He remains critically ill with poor cardiac output.cardiogenic shock despite 2 pressors. I was hopeful that his kidneys would recover to allow [place] to consider advanced HF therapies but he has had no signs of renal recovery despite nearly 2 weeks of aggressive care. Without renal recovery I fear there are no options for him. I have discussed this with him and he is realistic. Have asked for his family to come tomorrow to discuss further. Will place Palliative Consult. I will d/w Renal team today.  CRITICAL CARE Performed by: Glori Bickers  Total critical care time: 35 minutes  Critical care time was exclusive of separately billable procedures and treating other patients.  Critical care was necessary to treat or prevent imminent or life-threatening deterioration.  Critical care was time spent personally by me (independent of midlevel providers or residents) on the following activities: development of treatment plan with patient and/or surrogate as well as nursing, discussions with consultants, evaluation of patient's response to treatment,  examination of patient, obtaining history from patient or surrogate, ordering and performing treatments and interventions, ordering and review of laboratory studies, ordering and review of radiographic studies, pulse oximetry and re-evaluation of patient's condition.  Glori Bickers, MD  11:07 AM

## 2018-08-15 NOTE — Progress Notes (Signed)
[facility]    NEPHROLOGY PROGRESS NOTE  SUBJECTIVE: Patient feels poorly today still.  Nausea persists.  Afebrile.  Low CO-OX last pm prompting dobutamine, NE increased this AM. CRRT running.  Net I/Os yesterday -0.9L.  Per notes cardiology considering cath to determine if any salvageable myocardium.    OBJECTIVE:  Vitals:   08/15/18 1245 08/15/18 1300  BP: (!) 86/66 (!) 86/70  Pulse:    Resp: (!) 24 18  Temp:    SpO2:      Intake/Output Summary (Last 24 hours) at 08/15/2018 1313 Last data filed at 08/15/2018 1300 Gross per 24 hour  Intake 1713.42 ml  Output 3490 ml  Net -1776.58 ml      Genearl:  AAOx3, chronically ill HEENT: MMM [place] AT anicteric sclera CV:  Heart RRR (+) systolic murmur Lungs:  Sl inc wob with conversation Abd:  abd SNT/ND with normal BS GU:  Bladder non-palpable Extremities:  No LE edema. Skin:  No skin rash  MEDICATIONS:  . sodium chloride   Intravenous Once  . amiodarone  200 mg Oral BID  . aspirin EC  81 mg Oral Daily  . atorvastatin  80 mg Oral q1800  . chlorhexidine  15 mL Mouth Rinse BID  . Chlorhexidine Gluconate Cloth  6 each Topical Daily  . darbepoetin (ARANESP) injection - DIALYSIS  200 mcg Intravenous Q Thu-HD  . feeding supplement (ENSURE ENLIVE)  237 mL Oral BID BM  . feeding supplement (PRO-STAT SUGAR FREE 64)  30 mL Oral BID  . mouth rinse  15 mL Mouth Rinse BID  . mexiletine  150 mg Oral Q8H  . midodrine  15 mg Oral TID WC  . pantoprazole  40 mg Oral BID  . sodium chloride flush  3 mL Intravenous Q12H       LABS:   CBC Latest Ref Rng & Units 08/15/2018 08/14/2018 08/13/2018  WBC 4.0 - 10.5 K/uL 11.9(H) 11.1(H) 10.5  Hemoglobin 13.0 - 17.0 g/dL 9.2(L) 9.9(L) 9.0(L)  Hematocrit 39.0 - 52.0 % 31.3(L) 32.7(L) 29.8(L)  Platelets 150 - 400 K/uL 89(L) 104(L) 98(L)    CMP Latest Ref Rng & Units 08/15/2018 08/14/2018 08/13/2018  Glucose 70 - 99 mg/dL 125(H) 120(H) 108(H)  BUN 8 - 23 mg/dL 38(H) 39(H) 40(H)   Creatinine 0.61 - 1.24 mg/dL 3.53(H) 3.69(H) 3.72(H)  Sodium 135 - 145 mmol/L 135 136 132(L)  Potassium 3.5 - 5.1 mmol/L 5.0 5.3(H) 5.1  Chloride 98 - 111 mmol/L 97(L) 100 98  CO2 22 - 32 mmol/L 26 22 17(L)  Calcium 8.9 - 10.3 mg/dL 7.7(L) 7.8(L) 7.6(L)  Total Protein 6.5 - 8.1 g/dL - - -  Total Bilirubin 0.3 - 1.2 mg/dL - - -  Alkaline Phos 38 - 126 U/L - - -  AST 15 - 41 U/L - - -  ALT 0 - 44 U/L - - -    Lab Results  Component Value Date   CALCIUM 7.7 (L) 08/15/2018   CAION 1.00 (L) 07/06/2018   CAION 1.06 (L) 07/05/2018   PHOS 2.6 08/15/2018       Component Value Date/Time   COLORURINE YELLOW 07/15/2018 2208   APPEARANCEUR HAZY (A) 07/07/2018 2208   LABSPEC 1.013 07/05/2018 2208   PHURINE 5.0 07/06/2018 [place] 07/07/2018 2208   [place] 07/30/2018 2208   BILIRUBINUR NEGATIVE 07/15/2018 [place] 07/12/2018 2208   PROTEINUR NEGATIVE 07/21/2018 2208   NITRITE NEGATIVE 07/05/2018 2208   LEUKOCYTESUR NEGATIVE  07/23/2018 2208      Component Value Date/Time   HCO3 24.0 07/14/2018 0908   HCO3 25.7 07/12/2018 0908   TCO2 25 08/03/2018 0908   TCO2 27 07/07/2018 0908   ACIDBASEDEF 1.0 07/25/2018 0908   O2SAT 39.0 08/15/2018 0515       Component Value Date/Time   IRON 14 (L) 07/10/2018 1651   TIBC 458 (H) 07/31/2018 1651   FERRITIN 28 07/17/2018 1651   IRONPCTSAT 3 (L) 07/19/2018 1651       ASSESSMENT/PLAN:      1. Acute systolic heart failure ejection fraction 15% severe RV dysfunction with cardiogenic shock. Still requiring pressor support today. Tdoay cont CRRT for volume management.  Has minimal urine output  2. Coronary artery disease continues aspirin and statin.  LHC has been held to maximize chance for renal recovery.  It seems that his current level of cardiac function is not compatible with life outside ICU and if a LHC is thought to have potential to provide benefit it can be pursued.  I think the chance of renal  recovery is slim and not worth protecting if a contrast exposure could help him.    3. Acute kidney injurycrt 1.34 in August 2019secondary to acute tubular necrosis and hypoperfusion admission creatinine 3.91.Echogenic right kidney compatible medical renal disease. Minimal urine output.  In light of ongoing cardiogenic shock of protracted course I think renal recovery is unlikely.    4. Anemia- havestarteddarbepoetin. Completed RBC transfusion 1/26/2020and again 2/4.  5.  VT: on amio and mexilitine. Electrolytes have been ok.   Jannifer Hick MD [facility]

## 2018-08-15 NOTE — Consult Note (Signed)
Consultation Note Date: 08/15/2018   Patient Name: Samuel Bates  DOB: [birth year]  MRN: [id number]  Age / Sex: 63 yBateso., male  PCP: Samuel Halsted, FNP Referring Physician: Lelon Perla, MD  Reason for Consultation: Establishing goals of care and Psychosocial/spiritual support  HPI/Patient Profile: 62 yBateso. male  with past medical history of hypertension and hyperlipidemia who was admitted on 07/21/2018 with an MI that had most likely being going on for several days.   Work up revealed acute renal failure, an EF of 10-15%, and subsequently cardiogenic shock.  As of the date of this consult (hospital day 18) the patient is on CRRT, multiple pressors (still with soft blood pressure), he is having difficulty with nausea and vomiting.  He is eating very little.  Clinical Assessment and Goals of Care:  I have reviewed medical records including EPIC notes, labs and imaging, received report from his bedside RN, assessed the patient and then met at the bedside along with 2 of his 4 sons, Samuel Bates and Samuel Bates Kitchen, to discuss diagnosis prognosis, [place], EOL wishes, disposition and options.  I introduced Palliative Medicine as specialized medical care for people living with serious illness. It focuses on providing relief from the symptoms and stress of a serious illness. The goal is to improve quality of life for both the patient and the family.  We discussed a brief life review of the patient.  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  He has always enjoyed freshwater fishing and described seeking the quiet and peacefulness.    As far as functional and nutritional status prior to admission Samuel Bates was living alone.  He drove, grocery shop, and took care of all of his activities of daily living.  He  did not use a walker or any assistance for mobility.  Currently he is too ill to get out of bed.  Per the nurse he was up to chair last week.  He is unable to take much in the way of pBateso. nutrition.  Bradrick has chosen his son Samuel Bates Kitchen (his youngest son) as his decision making surrogate if he is unable to make his own decisions.  Current he is struggling with making a decision for or against cardiac cath.  We talked in general about the condition of his heart and the condition of his kidneys.  Samuel Bates Kitchen spoke up and requested to see his father's attending physician.  Samuel Bates Kitchen requests that Samuel Bates be transferred to [facility] for a second opinion.  I sensed that Samuel Bates Kitchen understands how ill his father is and feels he must do everything possible for him medically.  I talked with Samuel Bates Kitchen about how fragile his father is and how difficult transfer would be on CRRT.  Notes from the attending team indicate there will be a family meeting tomorrow.  PMT will follow with you to assist with goals of care as needed in coordination with the attending team.  Primary Decision Maker:  PATIENT  Surrogate decision maker is his youngest son, Samuel Bates Kitchen.    SUMMARY OF RECOMMENDATIONS    PMT will continue to follow and coordinate conversations with patient's family and the Attending medical team.  Code Status/Advance Care Planning:  Full code.  Not able to discuss today.   Symptom Management:   Patient biggest complaint was the need for 4-6 hours of uninterrupted sleep.   (This is likely not possible while on CRRT).  Additional Recommendations (Limitations, Scope, Preferences):  Son investigating transfer to [facility]  Palliative Prophylaxis:   Frequent Pain Assessment  Psycho-social/Spiritual:   Desire for further Chaplaincy support: welcomed.  Prognosis:   Very poor given low blood pressure on full pressor support, advanced heart failure and likely ESRD.  Discharge Planning: To Be  Determined      Primary Diagnoses: Present on Admission: . NSTEMI (non-ST elevated myocardial infarction) ([place])   I have reviewed the medical record, interviewed the patient and family, and examined the patient. The following aspects are pertinent.  Past Medical History:  Diagnosis Date  . Allergy   . Hx of adenomatous colonic polyps 03/07/2017  . Hyperlipidemia   . Hypertension    Social History   Socioeconomic History  . Marital status: Single    Spouse name: Not on file  . Number of children: 3  . Years of education: Not on file  . Highest education level: Not on file  Occupational History  . Not on file  Social Needs  . Financial resource strain: Not on file  . Food insecurity:    Worry: Not on file    Inability: Not on file  . Transportation needs:    Medical: Not on file    Non-medical: Not on file  Tobacco Use  . Smoking status: Current Every Day Smoker    Types: Cigars  . Smokeless tobacco: Never Used  . Tobacco comment: has cut back not daily  Substance and Sexual Activity  . Alcohol use: Yes    Alcohol/week: 2Bates0 standard drinks    Types: 2 Cans of beer per week    Comment: 2-3 beers per day  . Drug use: Yes    Types: Marijuana    Comment: had some Saturday 04/07/2018  . Sexual activity: Not on file  Lifestyle  . Physical activity:    Days per week: Not on file    Minutes per session: Not on file  . Stress: Not on file  Relationships  . Social connections:    Talks on phone: Not on file    Gets together: Not on file    Attends religious service: Not on file    Active member of club or organization: Not on file    Attends meetings of clubs or organizations: Not on file    Relationship status: Not on file  Other Topics Concern  . Not on file  Social History Narrative  . Not on file   Family History  Problem Relation Age of Onset  . Colon polyps Brother   . Stroke Brother   . COPD Father   . COPD Sister   . Colon cancer Neg Hx   .  Esophageal cancer Neg Hx   . Rectal cancer Neg Hx   . Stomach cancer Neg Hx   . Diabetes Neg Hx   . Colitis Neg Hx    Scheduled Meds: . sodium chloride   Intravenous Once  . amiodarone  200 mg Oral BID  .  aspirin EC  81 mg Oral Daily  . atorvastatin  80 mg Oral q1800  . chlorhexidine  15 mL Mouth Rinse BID  . Chlorhexidine Gluconate Cloth  6 each Topical Daily  . darbepoetin (ARANESP) injection - DIALYSIS  200 mcg Intravenous Q Thu-HD  . feeding supplement (ENSURE ENLIVE)  237 mL Oral BID BM  . feeding supplement (PRO-STAT SUGAR FREE 64)  30 mL Oral BID  . mouth rinse  15 mL Mouth Rinse BID  . mexiletine  150 mg Oral Q8H  . midodrine  15 mg Oral TID WC  . pantoprazole  40 mg Oral BID  . sodium chloride flush  3 mL Intravenous Q12H   Continuous Infusions: .  prismasol BGK 4/2Bates5 200 mL/hr at 08/15/18 0950  .  prismasol BGK 4/2Bates5 200 mL/hr at 08/15/18 1049  . sodium chloride    . sodium chloride Stopped (08/14/18 0727)  . sodium chloride    . sodium chloride    . DOBUTamine 2Bates5 mcg/kg/min (08/15/18 1400)  . heparin 10,000 units/ 20 mL infusion syringe 1,600 Units/hr (08/15/18 0953)  . norepinephrine (LEVOPHED) Adult infusion 40 mcg/min (08/15/18 1400)  . prismasol BGK 4/2Bates5 2,000 mL/hr at 08/15/18 1305   PRN Meds:.Place/Maintain arterial line **AND** sodium chloride, sodium chloride, sodium chloride, sodium chloride, acetaminophen, heparin, loperamide HCl, magic mouthwash, nitroGLYCERIN, ondansetron (ZOFRAN) IV, sodium chloride, sodium chloride flush Allergies  Allergen Reactions  . Penicillins Hives    Tolerated cefepime 1/20 Did it involve swelling of the face/tongue/throat, SOB, or low BP? YES Did it involve sudden or severe rash/hives, skin peeling, or any reaction on the inside of your mouth or nose? NO Did you need to seek medical attention at a hospital or doctor's office? YES When did it last happen? 30 years ago If all above answers are "NO", may proceed with  cephalosporin use.  . Simvastatin Other (See Comments)    insomnia   Review of Systems + for weakness, nausea, poor appetite, fatigue  Physical Exam  Thin extremely pleasant gentleman, lying comfortably in bed, speaks softly, awake, alert, coherent cv brady with systolic murmur resp no distress, no w/c/r Abdomen soft, nt, nd Lower ext no edema Skin no wounds or rash.  Vital Signs: BP (!) 84/63   Pulse (!) 37   Temp (!) 97Bates5 F (36Bates4 C) (Oral)   Resp 18   Ht '6\' 3"'  (1Bates905 m)   Wt 65Bates6 kg   SpO2 95%   BMI 18Bates08 kg/m  Pain Scale: 0-10   Pain Score: 0-No pain   SpO2: SpO2: 95 % O2 Device:SpO2: 95 % O2 Flow Rate: .O2 Flow Rate (L/min): 4 L/min  IO: Intake/output summary:   Intake/Output Summary (Last 24 hours) at 08/15/2018 1547 Last data filed at 08/15/2018 1400 Gross per 24 hour  Intake 1612Bates87 ml  Output 3140 ml  Net -1527Bates13 ml    LBM: Last BM Date: 08/15/18 Baseline Weight: Weight: 89Bates8 kg Most recent weight: Weight: 65Bates6 kg     Palliative Assessment/Data: 40%     Time In: 1:00 Time Out: 2:10 Time Total: 70 min. Greater than 50%  of this time was spent counseling and coordinating care related to the above assessment and plan.  Signed by: Florentina Jenny, PA-C Palliative Medicine Pager: [phone]  Please contact Palliative Medicine Team phone at [phone] for questions and concerns.  For individual provider: See Shea Evans

## 2018-08-16 ENCOUNTER — Inpatient Hospital Stay (HOSPITAL_COMMUNITY): Admission: EM | Disposition: E | Payer: Self-pay | Source: Home / Self Care | Attending: Cardiology

## 2018-08-16 DIAGNOSIS — I2510 Atherosclerotic heart disease of native coronary artery without angina pectoris: Secondary | ICD-10-CM

## 2018-08-16 HISTORY — PX: RIGHT/LEFT HEART CATH AND CORONARY ANGIOGRAPHY: CATH118266

## 2018-08-16 LAB — POCT I-STAT EG7
Acid-base deficit: 3 mmol/L — ABNORMAL HIGH (ref 0.0–2.0)
Acid-base deficit: 3 mmol/L — ABNORMAL HIGH (ref 0.0–2.0)
Bicarbonate: 22.3 mmol/L (ref 20.0–28.0)
Bicarbonate: 22.9 mmol/L (ref 20.0–28.0)
Calcium, Ion: 0.93 mmol/L — ABNORMAL LOW (ref 1.15–1.40)
Calcium, Ion: 0.95 mmol/L — ABNORMAL LOW (ref 1.15–1.40)
HCT: 30 % — ABNORMAL LOW (ref 39.0–52.0)
HCT: 31 % — ABNORMAL LOW (ref 39.0–52.0)
Hemoglobin: 10.2 g/dL — ABNORMAL LOW (ref 13.0–17.0)
Hemoglobin: 10.5 g/dL — ABNORMAL LOW (ref 13.0–17.0)
O2 Saturation: 34 %
O2 Saturation: 35 %
PO2 VEN: 22 mmHg — AB (ref 32.0–45.0)
Potassium: 4.9 mmol/L (ref 3.5–5.1)
Potassium: 5.1 mmol/L (ref 3.5–5.1)
Sodium: 131 mmol/L — ABNORMAL LOW (ref 135–145)
Sodium: 132 mmol/L — ABNORMAL LOW (ref 135–145)
TCO2: 24 mmol/L (ref 22–32)
TCO2: 24 mmol/L (ref 22–32)
pCO2, Ven: 41.4 mmHg — ABNORMAL LOW (ref 44.0–60.0)
pCO2, Ven: 41.5 mmHg — ABNORMAL LOW (ref 44.0–60.0)
pH, Ven: 7.339 (ref 7.250–7.430)
pH, Ven: 7.35 (ref 7.250–7.430)
pO2, Ven: 22 mmHg — CL (ref 32.0–45.0)

## 2018-08-16 LAB — POCT ACTIVATED CLOTTING TIME
ACTIVATED CLOTTING TIME: 191 s
Activated Clotting Time: 186 seconds
Activated Clotting Time: 191 seconds
Activated Clotting Time: 197 seconds

## 2018-08-16 LAB — CBC
HCT: 30.4 % — ABNORMAL LOW (ref 39.0–52.0)
Hemoglobin: 9 g/dL — ABNORMAL LOW (ref 13.0–17.0)
MCH: 22.9 pg — ABNORMAL LOW (ref 26.0–34.0)
MCHC: 29.6 g/dL — ABNORMAL LOW (ref 30.0–36.0)
MCV: 77.4 fL — ABNORMAL LOW (ref 80.0–100.0)
Platelets: 79 10*3/uL — ABNORMAL LOW (ref 150–400)
RBC: 3.93 MIL/uL — ABNORMAL LOW (ref 4.22–5.81)
RDW: 29.9 % — AB (ref 11.5–15.5)
WBC: 11 10*3/uL — ABNORMAL HIGH (ref 4.0–10.5)
nRBC: 92.9 % — ABNORMAL HIGH (ref 0.0–0.2)

## 2018-08-16 LAB — RENAL FUNCTION PANEL
ANION GAP: 15 (ref 5–15)
Albumin: 2.3 g/dL — ABNORMAL LOW (ref 3.5–5.0)
BUN: 29 mg/dL — ABNORMAL HIGH (ref 8–23)
CALCIUM: 7.6 mg/dL — AB (ref 8.9–10.3)
CO2: 21 mmol/L — ABNORMAL LOW (ref 22–32)
Chloride: 97 mmol/L — ABNORMAL LOW (ref 98–111)
Creatinine, Ser: 2.87 mg/dL — ABNORMAL HIGH (ref 0.61–1.24)
GFR calc Af Amer: 26 mL/min — ABNORMAL LOW (ref >60)
GFR calc non Af Amer: 22 mL/min — ABNORMAL LOW (ref >60)
Glucose, Bld: 108 mg/dL — ABNORMAL HIGH (ref 70–99)
Phosphorus: 2.5 mg/dL (ref 2.5–4.6)
Potassium: 5 mmol/L (ref 3.5–5.1)
SODIUM: 133 mmol/L — AB (ref 135–145)

## 2018-08-16 LAB — COOXEMETRY PANEL
Carboxyhemoglobin: 1.3 % (ref 0.5–1.5)
Methemoglobin: 1.9 % — ABNORMAL HIGH (ref 0.0–1.5)
O2 Saturation: 27.2 %
Total hemoglobin: 9.1 g/dL — ABNORMAL LOW (ref 12.0–16.0)

## 2018-08-16 LAB — POCT I-STAT 7, (LYTES, BLD GAS, ICA,H+H)
ACID-BASE DEFICIT: 4 mmol/L — AB (ref 0.0–2.0)
Bicarbonate: 20.7 mmol/L (ref 20.0–28.0)
CALCIUM ION: 0.98 mmol/L — AB (ref 1.15–1.40)
HCT: 31 % — ABNORMAL LOW (ref 39.0–52.0)
Hemoglobin: 10.5 g/dL — ABNORMAL LOW (ref 13.0–17.0)
O2 Saturation: 95 %
PH ART: 7.385 (ref 7.350–7.450)
Potassium: 5.2 mmol/L — ABNORMAL HIGH (ref 3.5–5.1)
Sodium: 129 mmol/L — ABNORMAL LOW (ref 135–145)
TCO2: 22 mmol/L (ref 22–32)
pCO2 arterial: 34.7 mmHg (ref 32.0–48.0)
pO2, Arterial: 75 mmHg — ABNORMAL LOW (ref 83.0–108.0)

## 2018-08-16 LAB — LACTIC ACID, PLASMA: Lactic Acid, Venous: 1.8 mmol/L (ref 0.5–1.9)

## 2018-08-16 LAB — MAGNESIUM: Magnesium: 2.6 mg/dL — ABNORMAL HIGH (ref 1.7–2.4)

## 2018-08-16 SURGERY — RIGHT/LEFT HEART CATH AND CORONARY ANGIOGRAPHY
Anesthesia: LOCAL

## 2018-08-16 MED ORDER — SODIUM CHLORIDE 0.9 % IV SOLN: 250.0000 mL | INTRAVENOUS | Status: DC | PRN

## 2018-08-16 MED ORDER — ASPIRIN 81 MG PO CHEW: 81.0000 mg | CHEWABLE_TABLET | ORAL | Status: DC

## 2018-08-16 MED ORDER — ALTEPLASE 2 MG IJ SOLR
2.0000 mg | Freq: Once | INTRAMUSCULAR | Status: AC
  Administered 2018-08-16: 2 mg

## 2018-08-16 MED ORDER — HEPARIN (PORCINE) IN NACL 1000-0.9 UT/500ML-% IV SOLN
INTRAVENOUS | Status: AC
  Filled 2018-08-16: qty 1000

## 2018-08-16 MED ORDER — ALTEPLASE 2 MG IJ SOLR
2.0000 mg | Freq: Once | INTRAMUSCULAR | Status: AC
  Administered 2018-08-16: 2 mg
  Filled 2018-08-16: qty 2

## 2018-08-16 MED ORDER — SODIUM CHLORIDE 0.9 % IV SOLN
250.0000 [IU]/h | INTRAVENOUS | Status: DC
  Administered 2018-08-17: 1250 [IU]/h via INTRAVENOUS_CENTRAL
  Administered 2018-08-17: 1750 [IU]/h via INTRAVENOUS_CENTRAL
  Administered 2018-08-17: 1250 [IU]/h via INTRAVENOUS_CENTRAL
  Administered 2018-08-18: 1700 [IU]/h via INTRAVENOUS_CENTRAL
  Administered 2018-08-18: 1750 [IU]/h via INTRAVENOUS_CENTRAL
  Administered 2018-08-18: 1300 [IU]/h via INTRAVENOUS_CENTRAL
  Administered 2018-08-19: 1700 [IU]/h via INTRAVENOUS_CENTRAL
  Administered 2018-08-19 (×2): 1750 [IU]/h via INTRAVENOUS_CENTRAL
  Administered 2018-08-19: 1550 [IU]/h via INTRAVENOUS_CENTRAL
  Administered 2018-08-20: 1150 [IU]/h via INTRAVENOUS_CENTRAL
  Filled 2018-08-16 (×14): qty 2

## 2018-08-16 MED ORDER — PROMETHAZINE HCL 12.5 MG RE SUPP
12.5000 mg | Freq: Four times a day (QID) | RECTAL | Status: DC | PRN
  Filled 2018-08-16: qty 1

## 2018-08-16 MED ORDER — SODIUM CHLORIDE 0.9% FLUSH: 3.0000 mL | INTRAVENOUS | Status: DC | PRN

## 2018-08-16 MED ORDER — PROMETHAZINE HCL 25 MG PO TABS
12.5000 mg | ORAL_TABLET | Freq: Four times a day (QID) | ORAL | Status: DC | PRN
  Administered 2018-08-17: 12.5 mg via ORAL
  Filled 2018-08-16 (×2): qty 1

## 2018-08-16 MED ORDER — ACETAMINOPHEN 325 MG PO TABS: 650.0000 mg | ORAL_TABLET | ORAL | Status: DC | PRN

## 2018-08-16 MED ORDER — LIDOCAINE HCL (PF) 1 % IJ SOLN
INTRAMUSCULAR | Status: AC
  Filled 2018-08-16: qty 30

## 2018-08-16 MED ORDER — ONDANSETRON HCL 4 MG/2ML IJ SOLN: 4.0000 mg | Freq: Four times a day (QID) | INTRAMUSCULAR | Status: DC | PRN

## 2018-08-16 MED ORDER — LIDOCAINE HCL (PF) 1 % IJ SOLN
INTRAMUSCULAR | Status: DC | PRN
  Administered 2018-08-16: 15 mL

## 2018-08-16 MED ORDER — SODIUM CHLORIDE 0.9% FLUSH: 3.0000 mL | Freq: Two times a day (BID) | INTRAVENOUS | Status: DC

## 2018-08-16 MED ORDER — SODIUM CHLORIDE 0.9% FLUSH
3.0000 mL | Freq: Two times a day (BID) | INTRAVENOUS | Status: DC
  Administered 2018-08-18 – 2018-08-19 (×2): 3 mL via INTRAVENOUS

## 2018-08-16 MED ORDER — SODIUM CHLORIDE 0.9 % IV SOLN: INTRAVENOUS | Status: DC

## 2018-08-16 MED ORDER — HEPARIN (PORCINE) 25000 UT/250ML-% IV SOLN
1500.0000 [IU]/h | INTRAVENOUS | Status: DC
  Filled 2018-08-16: qty 250

## 2018-08-16 MED ORDER — HEPARIN (PORCINE) IN NACL 1000-0.9 UT/500ML-% IV SOLN
INTRAVENOUS | Status: DC | PRN
  Administered 2018-08-16 (×2): 500 mL

## 2018-08-16 MED ORDER — IOHEXOL 350 MG/ML SOLN
INTRAVENOUS | Status: DC | PRN
  Administered 2018-08-16: 50 mL

## 2018-08-16 MED ORDER — SODIUM CHLORIDE 0.9 % IV SOLN
250.0000 [IU]/h | INTRAVENOUS | Status: DC
  Filled 2018-08-16: qty 2

## 2018-08-16 SURGICAL SUPPLY — 10 items
CATH INFINITI 5FR MULTPACK ANG (CATHETERS) ×1 IMPLANT
CATH SWAN GANZ 7F STRAIGHT (CATHETERS) ×1 IMPLANT
KIT HEART LEFT (KITS) ×2 IMPLANT
PACK CARDIAC CATHETERIZATION (CUSTOM PROCEDURE TRAY) ×2 IMPLANT
SHEATH PINNACLE 5F 10CM (SHEATH) ×1 IMPLANT
SHEATH PINNACLE 7F 10CM (SHEATH) ×1 IMPLANT
TRANSDUCER W/STOPCOCK (MISCELLANEOUS) ×2 IMPLANT
TUBING CIL FLEX 10 FLL-RA (TUBING) ×2 IMPLANT
WIRE EMERALD 3MM-J .025X260CM (WIRE) ×2 IMPLANT
WIRE EMERALD 3MM-J .035X150CM (WIRE) ×1 IMPLANT

## 2018-08-16 NOTE — Progress Notes (Signed)
Daily Progress Note   Patient Name: Samuel Bates       Date: 08/14/2018 DOB: [birth year]  Age: 63 y.o. MRN#: [id number] Attending Physician: Lelon Perla, MD Primary Care Physician: Saintclair Halsted, FNP Admit Date: 07/22/2018  Reason for Consultation/Follow-up: Non pain symptom management, Pain control and Psychosocial/spiritual support  Subjective: Chart reviewed.  Patient examined.  Had preliminary discussion with family (sister and 3 sons) regarding cath report.  Patient is in a positive/confused mood and states "I'm going to get up and walk out of here".  As patient seemed confused I spoke with sons outside of the room.  The decision making surrogate Marland Kitchen asks "with all the technology available isn't there anything else that can be done for him?"  I expressed to the sons that we need to hear what Dr. Haroldine Laws has to say, but I thought at this point we need to start focusing on his comfort and happiness.  Marland Kitchen kept his composure but was visibly upset and still looking for alternative treatments.    Patient reports issues with sleep (he wants to sleep 6 hours without interruption) and issues with nausea.  1 son requests that we give his father cranberry juice to help his kidneys.    Assessment: Patient now with ESRD, two completely occluded coronary arteries, GI Bleeding, advanced HF, on two pressors still with low BP.  Length of Stay: 19  Current Medications: Scheduled Meds:  . sodium chloride   Intravenous Once  . amiodarone  200 mg Oral BID  . aspirin EC  81 mg Oral Daily  . atorvastatin  80 mg Oral q1800  . chlorhexidine  15 mL Mouth Rinse BID  . Chlorhexidine Gluconate Cloth  6 each Topical Daily  . darbepoetin (ARANESP) injection - DIALYSIS  200 mcg Intravenous Q  Thu-HD  . feeding supplement (ENSURE ENLIVE)  237 mL Oral BID BM  . feeding supplement (PRO-STAT SUGAR FREE 64)  30 mL Oral BID  . mouth rinse  15 mL Mouth Rinse BID  . mexiletine  150 mg Oral Q8H  . midodrine  15 mg Oral TID WC  . pantoprazole  40 mg Oral BID  . sodium chloride flush  3 mL Intravenous Q12H  . sodium chloride flush  3 mL Intravenous Q12H  . sodium chloride flush  3 mL  Intravenous Q12H    Continuous Infusions: .  prismasol BGK 4/2.5 200 mL/hr at 08/15/18 0950  .  prismasol BGK 4/2.5 200 mL/hr at 08/15/18 1049  . sodium chloride    . sodium chloride Stopped (08/14/18 0727)  . sodium chloride    . sodium chloride    . sodium chloride    . sodium chloride    . DOBUTamine 2.5 mcg/kg/min (08/23/2018 1000)  . heparin    . norepinephrine (LEVOPHED) Adult infusion 50 mcg/min (08/05/2018 1451)  . prismasol BGK 4/2.5 2,000 mL/hr at 08/19/2018 0603    PRN Meds: Place/Maintain arterial line **AND** sodium chloride, sodium chloride, sodium chloride, sodium chloride, sodium chloride, sodium chloride, acetaminophen, heparin, loperamide HCl, magic mouthwash, nitroGLYCERIN, ondansetron (ZOFRAN) IV, sodium chloride, sodium chloride flush, sodium chloride flush, sodium chloride flush  Physical Exam        Thin, deconditioned, extremely pleasant man, awake, responsive with mild to moderate confusion Resp:  No distress  Vital Signs: BP 91/65   Pulse 87   Temp 98.6 F (37 C) (Oral)   Resp (!) 21   Ht 6\' 3"  (1.905 m)   Wt 67.3 kg   SpO2 99%   BMI 18.54 kg/m  SpO2: SpO2: 99 % O2 Device: O2 Device: Nasal Cannula O2 Flow Rate: O2 Flow Rate (L/min): 6 L/min  Intake/output summary:   Intake/Output Summary (Last 24 hours) at 08/07/2018 1731 Last data filed at 08/26/2018 1600 Gross per 24 hour  Intake 1349.55 ml  Output 1137 ml  Net 212.55 ml   LBM: Last BM Date: 08/10/2018 Baseline Weight: Weight: 89.8 kg Most recent weight: Weight: 67.3 kg       Palliative Assessment/Data:   20%    Flowsheet Rows     Most Recent Value  Intake Tab  Referral Department  Cardiology  Unit at Time of Referral  ICU  Palliative Care Primary Diagnosis  Cardiac  Date Notified  08/15/18  Palliative Care Type  New Palliative care  Reason for referral  Clarify Goals of Care  Date of Admission  07/12/2018  Date first seen by Palliative Care  08/15/18  # of days Palliative referral response time  0 Day(s)  # of days IP prior to Palliative referral  18  Clinical Assessment  Psychosocial & Spiritual Assessment  Palliative Care Outcomes      Patient Active Problem List   Diagnosis Date Noted  . ACS (acute coronary syndrome) ([place])   . Palliative care encounter   . Protein-calorie malnutrition, severe 08/14/2018  . Cardiogenic shock ([place]) 08/04/2018  . Acute gout 08/04/2018  . VT (ventricular tachycardia) ([place]) 08/04/2018  . NSTEMI (non-ST elevated myocardial infarction) ([place]) 07/20/2018  . Acute renal failure ([place])   . Hx of adenomatous colonic polyps 03/07/2017    Palliative Care Plan    Recommendations/Plan:  PMT will round tomorrow am (2/14) to institute comfort measures if appropriate.  I'm concerned that if he is taken off of support he may be become stable enough to leave the hospital  Will add medication for nausea and ask to limit interruptions over night  Goals of Care and Additional Recommendations:  Limitations on Scope of Treatment: Full Scope Treatment  Code Status:  Full code  Prognosis:  Given end stage renal disease, end stage heart failure, CAD and GIB, he is at high risk for an acute event that will end his life.  If he shifts to comfort measures only his prognosis will likely be hours to possibly days.  Discharge Planning:  To Be Determined  Care plan was discussed with bedside RN, family.  Thank you for allowing the Palliative Medicine Team to assist in the care of this patient.  Total time spent:  35 min.     Greater than 50%  of this  time was spent counseling and coordinating care related to the above assessment and plan.  Florentina Jenny, PA-C Palliative Medicine  Please contact Palliative MedicineTeam phone at [phone] for questions and concerns between 7 am - 7 pm.   Please see AMION for individual provider pager numbers.     '

## 2018-08-16 NOTE — Plan of Care (Signed)
  Problem: Health Behavior/Discharge Planning: Goal: Ability to manage health-related needs will improve Outcome: Not Progressing   Problem: Activity: Goal: Risk for activity intolerance will decrease Outcome: Not Progressing   

## 2018-08-16 NOTE — Progress Notes (Addendum)
ANTICOAGULATION CONSULT NOTE - Initial Consult  Pharmacy Consult for heparin Indication: chest pain/ACS  Allergies  Allergen Reactions  . Penicillins Hives    Tolerated cefepime 1/20 Did it involve swelling of the face/tongue/throat, SOB, or low BP? YES Did it involve sudden or severe rash/hives, skin peeling, or any reaction on the inside of your mouth or nose? NO Did you need to seek medical attention at a hospital or doctor's office? YES When did it last happen? 30 years ago If all above answers are "NO", may proceed with cephalosporin use.  . Simvastatin Other (See Comments)    insomnia    Patient Measurements: Height: 6\' 3"  (190.5 cm) Weight: 148 lb 5.9 oz (67.3 kg) IBW/kg (Calculated) : 84.5 Heparin Dosing Weight: 67kg  Vital Signs: Temp: 97.6 F (36.4 C) (02/13 1259) Temp Source: Oral (02/13 1259) BP: 90/61 (02/13 1300)  Labs: Recent Labs    08/14/18 0544 08/14/18 1021 08/15/18 0430 08/15/18 0948 08/22/2018 0327  HGB 9.9*  --   --  9.2* 9.0*  HCT 32.7*  --   --  31.3* 30.4*  PLT 104*  --   --  89* 79*  APTT 198* 89*  --   --   --   CREATININE 3.69*  --  3.53*  --  2.87*    Estimated Creatinine Clearance: 25.4 mL/min (A) (by C-G formula based on SCr of 2.87 mg/dL (H)).   Medical History: Past Medical History:  Diagnosis Date  . Allergy   . Hx of adenomatous colonic polyps 03/07/2017  . Hyperlipidemia   . Hypertension      Assessment: 9 yoM admitted with ACS managed medically complicated by renal failure. Pt now s/p cath with noted severe 2-vessel CAD and ongoing shock, pharmacy to begin on IV heparin 8hr after sheath pull (~1530). Pt previously on heparin via CRRT circuit but this was stopped after filter clotted. Pt recently therapeutic at 1500 units/hr so will begin infusion at this rate with no bolus.  Goal of Therapy:  Heparin level 0.3-0.7 units/ml Monitor platelets by anticoagulation protocol: Yes   Plan:  -Heparin 1500 units/hr with no  bolus at 2330 -Check 8hr heparin level  Arrie Senate, PharmD, BCPS Clinical Pharmacist [phone] Please check AMION for all [place] numbers 08/07/2018   Addendum: Spoke to Dr. Haroldine Laws, he only wants heparin for the sake of patency of the CRRT circuit.  Will switch to syringe of heparin to be added directly to the circuit as needed to maintain ACTs at goal.  [place] Crane, Vena Austria, BCPS, [place] Pharmacist Phone [phone]  08/22/2018 8:03 PM

## 2018-08-16 NOTE — Progress Notes (Signed)
Patient ID: Samuel Bates, male   DOB: Jul 17, 1955, 63 y.o.   MRN: [id number]     Advanced Heart Failure Rounding Note  PCP-Cardiologist: No primary care provider on file.   Subjective:    Off CRRT due to line clotting. Remains on dobutamine 2.5 NE 40. SBP low 90s  Complaining of ab cramping and some BRBPR. No CP   Objective:   Weight Range: 67.3 kg Body mass index is 18.54 kg/m.   Vital Signs:   Temp:  [97.3 F (36.3 C)-97.6 F (36.4 C)] 97.6 F (36.4 C) (02/13 1259) Pulse Rate:  [85-86] 85 (02/13 0205) Resp:  [13-28] 20 (02/13 1300) BP: (67-120)/(55-94) 90/61 (02/13 1300) SpO2:  [71 %-100 %] 98 % (02/13 0300) Weight:  [67.3 kg] 67.3 kg (02/13 0500) Last BM Date: 08/10/2018  Weight change: Filed Weights   08/14/18 0500 08/15/18 0545 08/28/2018 0500  Weight: 67.7 kg 67.6 kg 67.3 kg   Intake/Output:   Intake/Output Summary (Last 24 hours) at 08/22/2018 1404 Last data filed at 08/15/2018 1000 Gross per 24 hour  Intake 1217.03 ml  Output 1342 ml  Net -124.97 ml    Physical Exam   CVP 10 General:  Weak appearing. No resp difficulty HEENT: normal Neck: supple. JVP to jaw. RIJ tunneled cath Carotids 2+ bilat; no bruits. No lymphadenopathy or thryomegaly appreciated. Cor: PMI laterally displaced. Regular +s3. Lungs: clear Abdomen: soft, nontender, nondistended. No hepatosplenomegaly. No bruits or masses. Good bowel sounds. Extremities: no cyanosis, clubbing, rash, trace edema Neuro: alert & orientedx3, cranial nerves grossly intact. moves all 4 extremities w/o difficulty. Affect pleasant   Telemetry   SR 80-90s + PVCs personally reviewed.  Labs    CBC Recent Labs    08/15/18 0948 08/11/2018 0327  WBC 11.9* 11.0*  HGB 9.2* 9.0*  HCT 31.3* 30.4*  MCV 77.5* 77.4*  PLT 89* 79*   Basic Metabolic Panel Recent Labs    08/15/18 0430 08/09/2018 0327  NA 135 133*  K 5.0 5.0  CL 97* 97*  CO2 26 21*  GLUCOSE 125* 108*  BUN 38* 29*  CREATININE 3.53* 2.87*    CALCIUM 7.7* 7.6*  MG 2.7* 2.6*  PHOS 2.6 2.5   Liver Function Tests Recent Labs    08/15/18 0430 08/15/2018 0327  ALBUMIN 2.4* 2.3*   No results for input(s): LIPASE, AMYLASE in the last 72 hours. Cardiac Enzymes No results for input(s): CKTOTAL, CKMB, CKMBINDEX, TROPONINI in the last 72 hours.  BNP: BNP (last 3 results) Recent Labs    08/02/2018 1651  BNP 4,188.4*    ProBNP (last 3 results) No results for input(s): PROBNP in the last 8760 hours.   D-Dimer No results for input(s): DDIMER in the last 72 hours. Hemoglobin A1C No results for input(s): HGBA1C in the last 72 hours. Fasting Lipid Panel No results for input(s): CHOL, HDL, LDLCALC, TRIG, CHOLHDL, LDLDIRECT in the last 72 hours. Thyroid Function Tests No results for input(s): TSH, T4TOTAL, T3FREE, THYROIDAB in the last 72 hours.  Invalid input(s): FREET3  Other results:   Imaging    No results found.   Medications:     Scheduled Medications: . sodium chloride   Intravenous Once  . amiodarone  200 mg Oral BID  . aspirin  81 mg Oral Pre-Cath  . aspirin EC  81 mg Oral Daily  . atorvastatin  80 mg Oral q1800  . chlorhexidine  15 mL Mouth Rinse BID  . Chlorhexidine Gluconate Cloth  6 each Topical Daily  .  darbepoetin (ARANESP) injection - DIALYSIS  200 mcg Intravenous Q Thu-HD  . feeding supplement (ENSURE ENLIVE)  237 mL Oral BID BM  . feeding supplement (PRO-STAT SUGAR FREE 64)  30 mL Oral BID  . mouth rinse  15 mL Mouth Rinse BID  . mexiletine  150 mg Oral Q8H  . midodrine  15 mg Oral TID WC  . pantoprazole  40 mg Oral BID  . sodium chloride flush  3 mL Intravenous Q12H    Infusions: .  prismasol BGK 4/2.5 200 mL/hr at 08/15/18 0950  .  prismasol BGK 4/2.5 200 mL/hr at 08/15/18 1049  . sodium chloride    . sodium chloride Stopped (08/14/18 0727)  . sodium chloride    . sodium chloride    . sodium chloride    . DOBUTamine 2.5 mcg/kg/min (08/26/2018 1000)  . heparin 10,000 units/ 20 mL  infusion syringe 1,700 Units/hr (08/27/2018 0620)  . norepinephrine (LEVOPHED) Adult infusion 40 mcg/min (08/21/2018 1243)  . prismasol BGK 4/2.5 2,000 mL/hr at 08/15/2018 0603    PRN Medications: Place/Maintain arterial line **AND** sodium chloride, sodium chloride, sodium chloride, sodium chloride, acetaminophen, heparin, loperamide HCl, magic mouthwash, nitroGLYCERIN, ondansetron (ZOFRAN) IV, sodium chloride, sodium chloride flush   Patient Profile   63 y.o.malewith past medical history of hypertension, hyperlipidemia admitted with recent but late presenting inferior lateral myocardial infarction, cardiogenic shock, acute renal failure and microcytic anemia. Echocardiogram showed severe LV and RV dysfunction and moderate mitral regurgitation.  Assessment/Plan   1. Acute systolic HF with cardiogenic shock due to OOH MI: Ischemic cardiomyopathy, echo 1/5 EF 15% severe RV dysfunction and moderate MR.   - Now on norepi 40 mcg + dobutamine 2.5 mcg + midodrine 15 mg tid.  - CO-OX back down to 27% and symptoms suggestive of ischemic colitis - No ACE/ARB/ARNI or b-blocker with shock and AKI 2. CAD: s/p OOH inferolateral MI. Initial trop 28, then trended down.  No chest pain.   - Continue ASA and statin. No b-blocker with shock.  - As renal recovery looks unlikely, will need coronary angiography. Have d/w with him and his family as well as Renal. Willproceed today. 3. AKI/ESRD with uremia: likely due to ATN and hypoperfusion. Initial BUN/CR 198/3.91. Baseline creatinine normal several months ago. - Renal following CRRT started on 1/25, now on CVVHD. Anuric  - Renal feels that his kidneys are unlikely to recover. I agree. CRRT on hold with clotted cath 4. Acute hypoxic respiratory failure: CXR with bilateral interstitial edema - Remains on 4L HFNC. Continue to wean oxygen as tolerated.  5. Microcytic anemia due to IDA - Iron stores low. Has received Feraheme - Will eventually need GI w/u -  Continue protonix 6. Shock liver 7. VT: Quiescent on amiodarone gtt and PO mexilitene. No further VT.  8. Ab pain and BRBPR - suspect ischemic colitis   He remains critically ill with poor cardiac output/cardiogenic shock despite 2 pressors. I was hopeful that his kidneys would recover to allow [place] to consider advanced HF therapies but he has had no signs of renal recovery despite nearly 2 weeks of aggressive care. Without renal recovery he will require HD but his heart currently too weak to tolerate. The only option at this point would be to proceed with cath and hoping to find a high-grade proximal lesion which might improve his cardiac output with stenting. I think this is unlikely but it is our only hope. Not candidate for dual organ transplantation with age and recent smoking  history. Await cath results but suspect we are headed toward comfort. D/w family at length and also discussed opportunity to get a second opinion at academic center if they wanted.   CRITICAL CARE Performed by: Glori Bickers  Total critical care time: 45 minutes  Critical care time was exclusive of separately billable procedures and treating other patients.  Critical care was necessary to treat or prevent imminent or life-threatening deterioration.  Critical care was time spent personally by me (independent of midlevel providers or residents) on the following activities: development of treatment plan with patient and/or surrogate as well as nursing, discussions with consultants, evaluation of patient's response to treatment, examination of patient, obtaining history from patient or surrogate, ordering and performing treatments and interventions, ordering and review of laboratory studies, ordering and review of radiographic studies, pulse oximetry and re-evaluation of patient's condition.  Glori Bickers, MD  2:11 PM

## 2018-08-16 NOTE — Progress Notes (Signed)
IV team paged d/t no blood return from HD cath. CRRT is delayed until IV team can come to assess lumen status. Will continue to monitor pt closely.

## 2018-08-16 NOTE — Progress Notes (Signed)
  Cath results reviewed in detail with patient and his family.   I also discussed case with our interventional team, TCTS and the [facility]. We all agree that in light of his ESRD and recent tobacco use there are no durable options for survival.   I discussed code status and comfort care with patient and his family and they are discussing further.   Will restart CRRT now and he will remain full code unless family notifies [place] otherwise.   Additional CCT 60 minutes.   Glori Bickers, MD  9:32 PM

## 2018-08-16 NOTE — Interval H&P Note (Signed)
History and Physical Interval Note:  08/26/2018 2:15 PM  Samuel Bates  has presented today for surgery, with the diagnosis of cardiogenic shock  The various methods of treatment have been discussed with the patient and family. After consideration of risks, benefits and other options for treatment, the patient has consented to  Procedure(s): RIGHT/LEFT HEART CATH AND CORONARY ANGIOGRAPHY (N/A) and possible coronary angioplasty as a surgical intervention .  The patient's history has been reviewed, patient examined, no change in status, stable for surgery.  I have reviewed the patient's chart and labs.  Questions were answered to the patient's satisfaction.     Krisann Mckenna

## 2018-08-16 NOTE — Progress Notes (Signed)
[facility]    NEPHROLOGY PROGRESS NOTE  SUBJECTIVE: Patient feels ok today. Nausea persists.  Afebrile.   CRRT off this AM after filter clotted.  Net I/Os yesterday -0.4L.  He tells me a family meeting is planned with cardiology this AM.    OBJECTIVE:  Vitals:   08/18/2018 0900 08/13/2018 1000  BP: (!) 80/64 (!) 83/62  Pulse:    Resp: (!) 26 (!) 28  Temp:    SpO2:      Intake/Output Summary (Last 24 hours) at 08/26/2018 1153 Last data filed at 08/27/2018 1000 Gross per 24 hour  Intake 1457.02 ml  Output 1619 ml  Net -161.98 ml      Genearl:  AAOx3, chronically ill HEENT: MMM [place] AT anicteric sclera CV:  Heart RRR (+) systolic murmur Lungs:  Sl inc wob with conversation Abd:  abd SNT/ND with normal BS GU:  Bladder non-palpable Extremities:  No LE edema. Skin:  No skin rash  MEDICATIONS:  . sodium chloride   Intravenous Once  . amiodarone  200 mg Oral BID  . aspirin  81 mg Oral Pre-Cath  . aspirin EC  81 mg Oral Daily  . atorvastatin  80 mg Oral q1800  . chlorhexidine  15 mL Mouth Rinse BID  . Chlorhexidine Gluconate Cloth  6 each Topical Daily  . darbepoetin (ARANESP) injection - DIALYSIS  200 mcg Intravenous Q Thu-HD  . feeding supplement (ENSURE ENLIVE)  237 mL Oral BID BM  . feeding supplement (PRO-STAT SUGAR FREE 64)  30 mL Oral BID  . mouth rinse  15 mL Mouth Rinse BID  . mexiletine  150 mg Oral Q8H  . midodrine  15 mg Oral TID WC  . pantoprazole  40 mg Oral BID  . sodium chloride flush  3 mL Intravenous Q12H       LABS:   CBC Latest Ref Rng & Units 08/07/2018 08/15/2018 08/14/2018  WBC 4.0 - 10.5 K/uL 11.0(H) 11.9(H) 11.1(H)  Hemoglobin 13.0 - 17.0 g/dL 9.0(L) 9.2(L) 9.9(L)  Hematocrit 39.0 - 52.0 % 30.4(L) 31.3(L) 32.7(L)  Platelets 150 - 400 K/uL 79(L) 89(L) 104(L)    CMP Latest Ref Rng & Units 08/08/2018 08/15/2018 08/14/2018  Glucose 70 - 99 mg/dL 108(H) 125(H) 120(H)  BUN 8 - 23 mg/dL 29(H) 38(H) 39(H)  Creatinine 0.61 - 1.24 mg/dL  2.87(H) 3.53(H) 3.69(H)  Sodium 135 - 145 mmol/L 133(L) 135 136  Potassium 3.5 - 5.1 mmol/L 5.0 5.0 5.3(H)  Chloride 98 - 111 mmol/L 97(L) 97(L) 100  CO2 22 - 32 mmol/L 21(L) 26 22  Calcium 8.9 - 10.3 mg/dL 7.6(L) 7.7(L) 7.8(L)  Total Protein 6.5 - 8.1 g/dL - - -  Total Bilirubin 0.3 - 1.2 mg/dL - - -  Alkaline Phos 38 - 126 U/L - - -  AST 15 - 41 U/L - - -  ALT 0 - 44 U/L - - -    Lab Results  Component Value Date   CALCIUM 7.6 (L) 08/27/2018   CAION 1.00 (L) 07/06/2018   CAION 1.06 (L) 07/13/2018   PHOS 2.5 08/15/2018       Component Value Date/Time   COLORURINE YELLOW 08/01/2018 2208   APPEARANCEUR HAZY (A) 07/19/2018 2208   LABSPEC 1.013 07/08/2018 2208   PHURINE 5.0 07/17/2018 [place] 07/16/2018 [place] 07/22/2018 [place] 07/09/2018 [place] 07/17/2018 2208   PROTEINUR NEGATIVE 07/13/2018 2208   NITRITE NEGATIVE 07/25/2018 2208  LEUKOCYTESUR NEGATIVE 07/06/2018 2208      Component Value Date/Time   HCO3 24.0 07/28/2018 0908   HCO3 25.7 07/31/2018 0908   TCO2 25 07/20/2018 0908   TCO2 27 07/14/2018 0908   ACIDBASEDEF 1.0 07/04/2018 0908   O2SAT 27.2 08/13/2018 0524       Component Value Date/Time   IRON 14 (L) 07/27/2018 1651   TIBC 458 (H) 07/21/2018 1651   FERRITIN 28 07/18/2018 1651   IRONPCTSAT 3 (L) 07/14/2018 1651       ASSESSMENT/PLAN:      1. Acute systolic heart failure ejection fraction 15% severe RV dysfunction with cardiogenic shock. Still requiring pressor support today. Today cont CRRT for volume management.  Anuric.   2. Coronary artery disease continues aspirin and statin.  LHC has been held to maximize chance for renal recovery.  It seems that his current level of cardiac function is not compatible with life outside ICU and if a LHC is thought to have potential to provide benefit it can be pursued.  I think the chance of renal recovery is slim and not worth protecting if  a contrast exposure could help him.  I have let him know my thoughts in plain language.  3. Acute kidney injurycrt 1.34 in August 2019secondary to acute tubular necrosis and hypoperfusion admission creatinine 3.91.Echogenic right kidney compatible medical renal disease. Essentially anuric.  In light of ongoing cardiogenic shock of protracted course I think renal recovery is unlikely.  Dependent on CRRT currently.  Would not tolerate iHD.   4. Anemia- havestarteddarbepoetin. Completed RBC transfusion 1/26/2020and again 2/4.  5.  VT: on amio and mexilitine. Electrolytes have been ok.   Jannifer Hick MD [facility]

## 2018-08-16 NOTE — Progress Notes (Signed)
Nutrition Follow-up  DOCUMENTATION CODES:   Severe malnutrition in context of acute illness/injury  INTERVENTION:   - Will follow for GOC decisions  - Liberalize diet to Regular per discussion with nephrology and cardiology  - Continue Ensure Enlive po BID, each supplement provides 350 kcal and 20 grams of protein (encourage pt to consume in small portions)  - Continue Pro-stat 30 ml BID, each supplement provides 100 kcal and 15 grams of protein  NUTRITION DIAGNOSIS:   Severe Malnutrition related to acute illness (acute systolic heart failure with cardiogenic shock) as evidenced by moderate fat depletion, moderate muscle depletion.  Ongoing  GOAL:   Patient will meet greater than or equal to 90% of their needs  Unmet  MONITOR:   PO intake, Supplement acceptance, Weight trends, I & O's, Labs, GOC  REASON FOR ASSESSMENT:   LOS    ASSESSMENT:   63 year old male who presented to the ED on 1/25 with SOB. PMH significant for HTN, HLD. Pt found to have acute systolic CHF likely from recent MI and AKI.  Spoke with pt and family members at bedside. Pt not on CRRT at time of visit. Pt states that he is waiting for surgery. Pt denies any nausea at this time and states that he received nausea medication which has been helping him. Pt shares that he was able to consume half of a bowl of soup yesterday. Pt continues to be wary of consuming Ensure Enlive oral nutrition supplements after experiencing emesis. Pt states that he has not had anything to eat today.  Reviewed palliative care note from 2/12. Per heart failure MD note today, pt to undergo cardiac cath today in hopes to find high-grade proximal lesion which might improve cardiac output with stenting. If not, MD recommends comfort care.  Pt's son with questions regarding pt's weight loss. Explained negative fluid balance contributing to weight loss and also that pt has not been eating or drinking much due to N/V. Overall, pt with  32 lb weight loss since admission. Severe malnutrition in the context of acute malnutrition persists.  Meal Completion: 0-100% x last 8 meals (extremely varied).  Medications reviewed and include: Ensure Enlive BID (pt refusing), Pro-stat BID (pt refusing), Protonix, Levophed, Dobutamine  Labs reviewed: sodium 133 (L), magnesium 2.6 (H), hemoglobin 9.0 (L)  CRRT output: 2105 ml x 24 hours I/O's: -18.8 L since admission  Diet Order:   Diet Order            Diet NPO time specified  Diet effective now              EDUCATION NEEDS:   Education needs have been addressed  Skin:  Skin Assessment: Reviewed RN Assessment  Last BM:  2/13 small type 7  Height:   Ht Readings from Last 1 Encounters:  07/09/2018 6\' 3"  (1.905 m)    Weight:   Wt Readings from Last 1 Encounters:  08/15/2018 67.3 kg    Ideal Body Weight:  89.1 kg  BMI:  Body mass index is 18.54 kg/m.  Estimated Nutritional Needs:   Kcal:  2400-2600  Protein:  115-130 grams  Fluid:  per MD    Gaynell Face, MS, RD, LDN Inpatient Clinical Dietitian Pager: [phone] Weekend/After Hours: [phone]

## 2018-08-16 NOTE — H&P (View-Only) (Signed)
Patient ID: Benjerman Molinelli, male   DOB: 12-31-1955, 63 y.o.   MRN: [id number]     Advanced Heart Failure Rounding Note  PCP-Cardiologist: No primary care provider on file.   Subjective:    Off CRRT due to line clotting. Remains on dobutamine 2.5 NE 40. SBP low 90s  Complaining of ab cramping and some BRBPR. No CP   Objective:   Weight Range: 67.3 kg Body mass index is 18.54 kg/m.   Vital Signs:   Temp:  [97.3 F (36.3 C)-97.6 F (36.4 C)] 97.6 F (36.4 C) (02/13 1259) Pulse Rate:  [85-86] 85 (02/13 0205) Resp:  [13-28] 20 (02/13 1300) BP: (67-120)/(55-94) 90/61 (02/13 1300) SpO2:  [71 %-100 %] 98 % (02/13 0300) Weight:  [67.3 kg] 67.3 kg (02/13 0500) Last BM Date: 08/06/2018  Weight change: Filed Weights   08/14/18 0500 08/15/18 0545 08/22/2018 0500  Weight: 67.7 kg 67.6 kg 67.3 kg   Intake/Output:   Intake/Output Summary (Last 24 hours) at 08/28/2018 1404 Last data filed at 08/23/2018 1000 Gross per 24 hour  Intake 1217.03 ml  Output 1342 ml  Net -124.97 ml    Physical Exam   CVP 10 General:  Weak appearing. No resp difficulty HEENT: normal Neck: supple. JVP to jaw. RIJ tunneled cath Carotids 2+ bilat; no bruits. No lymphadenopathy or thryomegaly appreciated. Cor: PMI laterally displaced. Regular +s3. Lungs: clear Abdomen: soft, nontender, nondistended. No hepatosplenomegaly. No bruits or masses. Good bowel sounds. Extremities: no cyanosis, clubbing, rash, trace edema Neuro: alert & orientedx3, cranial nerves grossly intact. moves all 4 extremities w/o difficulty. Affect pleasant   Telemetry   SR 80-90s + PVCs personally reviewed.  Labs    CBC Recent Labs    08/15/18 0948 08/07/2018 0327  WBC 11.9* 11.0*  HGB 9.2* 9.0*  HCT 31.3* 30.4*  MCV 77.5* 77.4*  PLT 89* 79*   Basic Metabolic Panel Recent Labs    08/15/18 0430 08/09/2018 0327  NA 135 133*  K 5.0 5.0  CL 97* 97*  CO2 26 21*  GLUCOSE 125* 108*  BUN 38* 29*  CREATININE 3.53* 2.87*    CALCIUM 7.7* 7.6*  MG 2.7* 2.6*  PHOS 2.6 2.5   Liver Function Tests Recent Labs    08/15/18 0430 08/13/2018 0327  ALBUMIN 2.4* 2.3*   No results for input(s): LIPASE, AMYLASE in the last 72 hours. Cardiac Enzymes No results for input(s): CKTOTAL, CKMB, CKMBINDEX, TROPONINI in the last 72 hours.  BNP: BNP (last 3 results) Recent Labs    07/25/2018 1651  BNP 4,188.4*    ProBNP (last 3 results) No results for input(s): PROBNP in the last 8760 hours.   D-Dimer No results for input(s): DDIMER in the last 72 hours. Hemoglobin A1C No results for input(s): HGBA1C in the last 72 hours. Fasting Lipid Panel No results for input(s): CHOL, HDL, LDLCALC, TRIG, CHOLHDL, LDLDIRECT in the last 72 hours. Thyroid Function Tests No results for input(s): TSH, T4TOTAL, T3FREE, THYROIDAB in the last 72 hours.  Invalid input(s): FREET3  Other results:   Imaging    No results found.   Medications:     Scheduled Medications: . sodium chloride   Intravenous Once  . amiodarone  200 mg Oral BID  . aspirin  81 mg Oral Pre-Cath  . aspirin EC  81 mg Oral Daily  . atorvastatin  80 mg Oral q1800  . chlorhexidine  15 mL Mouth Rinse BID  . Chlorhexidine Gluconate Cloth  6 each Topical Daily  .  darbepoetin (ARANESP) injection - DIALYSIS  200 mcg Intravenous Q Thu-HD  . feeding supplement (ENSURE ENLIVE)  237 mL Oral BID BM  . feeding supplement (PRO-STAT SUGAR FREE 64)  30 mL Oral BID  . mouth rinse  15 mL Mouth Rinse BID  . mexiletine  150 mg Oral Q8H  . midodrine  15 mg Oral TID WC  . pantoprazole  40 mg Oral BID  . sodium chloride flush  3 mL Intravenous Q12H    Infusions: .  prismasol BGK 4/2.5 200 mL/hr at 08/15/18 0950  .  prismasol BGK 4/2.5 200 mL/hr at 08/15/18 1049  . sodium chloride    . sodium chloride Stopped (08/14/18 0727)  . sodium chloride    . sodium chloride    . sodium chloride    . DOBUTamine 2.5 mcg/kg/min (08/25/2018 1000)  . heparin 10,000 units/ 20 mL  infusion syringe 1,700 Units/hr (08/21/2018 0620)  . norepinephrine (LEVOPHED) Adult infusion 40 mcg/min (08/14/2018 1243)  . prismasol BGK 4/2.5 2,000 mL/hr at 08/15/2018 0603    PRN Medications: Place/Maintain arterial line **AND** sodium chloride, sodium chloride, sodium chloride, sodium chloride, acetaminophen, heparin, loperamide HCl, magic mouthwash, nitroGLYCERIN, ondansetron (ZOFRAN) IV, sodium chloride, sodium chloride flush   Patient Profile   62 y.o.malewith past medical history of hypertension, hyperlipidemia admitted with recent but late presenting inferior lateral myocardial infarction, cardiogenic shock, acute renal failure and microcytic anemia. Echocardiogram showed severe LV and RV dysfunction and moderate mitral regurgitation.  Assessment/Plan   1. Acute systolic HF with cardiogenic shock due to OOH MI: Ischemic cardiomyopathy, echo 1/5 EF 15% severe RV dysfunction and moderate MR.   - Now on norepi 40 mcg + dobutamine 2.5 mcg + midodrine 15 mg tid.  - CO-OX back down to 27% and symptoms suggestive of ischemic colitis - No ACE/ARB/ARNI or b-blocker with shock and AKI 2. CAD: s/p OOH inferolateral MI. Initial trop 28, then trended down.  No chest pain.   - Continue ASA and statin. No b-blocker with shock.  - As renal recovery looks unlikely, will need coronary angiography. Have d/w with him and his family as well as Renal. Willproceed today. 3. AKI/ESRD with uremia: likely due to ATN and hypoperfusion. Initial BUN/CR 198/3.91. Baseline creatinine normal several months ago. - Renal following CRRT started on 1/25, now on CVVHD. Anuric  - Renal feels that his kidneys are unlikely to recover. I agree. CRRT on hold with clotted cath 4. Acute hypoxic respiratory failure: CXR with bilateral interstitial edema - Remains on 4L HFNC. Continue to wean oxygen as tolerated.  5. Microcytic anemia due to IDA - Iron stores low. Has received Feraheme - Will eventually need GI w/u -  Continue protonix 6. Shock liver 7. VT: Quiescent on amiodarone gtt and PO mexilitene. No further VT.  8. Ab pain and BRBPR - suspect ischemic colitis   He remains critically ill with poor cardiac output/cardiogenic shock despite 2 pressors. I was hopeful that his kidneys would recover to allow [place] to consider advanced HF therapies but he has had no signs of renal recovery despite nearly 2 weeks of aggressive care. Without renal recovery he will require HD but his heart currently too weak to tolerate. The only option at this point would be to proceed with cath and hoping to find a high-grade proximal lesion which might improve his cardiac output with stenting. I think this is unlikely but it is our only hope. Not candidate for dual organ transplantation with age and recent smoking  history. Await cath results but suspect we are headed toward comfort. D/w family at length and also discussed opportunity to get a second opinion at academic center if they wanted.   CRITICAL CARE Performed by: Glori Bickers  Total critical care time: 45 minutes  Critical care time was exclusive of separately billable procedures and treating other patients.  Critical care was necessary to treat or prevent imminent or life-threatening deterioration.  Critical care was time spent personally by me (independent of midlevel providers or residents) on the following activities: development of treatment plan with patient and/or surrogate as well as nursing, discussions with consultants, evaluation of patient's response to treatment, examination of patient, obtaining history from patient or surrogate, ordering and performing treatments and interventions, ordering and review of laboratory studies, ordering and review of radiographic studies, pulse oximetry and re-evaluation of patient's condition.  Glori Bickers, MD  2:11 PM

## 2018-08-17 ENCOUNTER — Encounter (HOSPITAL_COMMUNITY): Payer: Self-pay | Admitting: Internal Medicine

## 2018-08-17 DIAGNOSIS — N185 Chronic kidney disease, stage 5: Secondary | ICD-10-CM

## 2018-08-17 LAB — POCT ACTIVATED CLOTTING TIME
ACTIVATED CLOTTING TIME: 175 s
Activated Clotting Time: 169 seconds
Activated Clotting Time: 175 seconds
Activated Clotting Time: 180 seconds
Activated Clotting Time: 180 seconds
Activated Clotting Time: 180 seconds
Activated Clotting Time: 180 seconds
Activated Clotting Time: 186 seconds
Activated Clotting Time: 197 seconds

## 2018-08-17 LAB — MAGNESIUM: Magnesium: 2.6 mg/dL — ABNORMAL HIGH (ref 1.7–2.4)

## 2018-08-17 LAB — COOXEMETRY PANEL
Carboxyhemoglobin: 1.3 % (ref 0.5–1.5)
Methemoglobin: 1.1 % (ref 0.0–1.5)
O2 Saturation: 33.4 %
TOTAL HEMOGLOBIN: 8.8 g/dL — AB (ref 12.0–16.0)

## 2018-08-17 LAB — CBC
HCT: 29.1 % — ABNORMAL LOW (ref 39.0–52.0)
Hemoglobin: 8.7 g/dL — ABNORMAL LOW (ref 13.0–17.0)
MCH: 23.4 pg — ABNORMAL LOW (ref 26.0–34.0)
MCHC: 29.9 g/dL — ABNORMAL LOW (ref 30.0–36.0)
MCV: 78.2 fL — ABNORMAL LOW (ref 80.0–100.0)
NRBC: 144.7 % — AB (ref 0.0–0.2)
Platelets: 59 10*3/uL — ABNORMAL LOW (ref 150–400)
RBC: 3.72 MIL/uL — AB (ref 4.22–5.81)
RDW: 30.2 % — ABNORMAL HIGH (ref 11.5–15.5)
WBC: 8.8 10*3/uL (ref 4.0–10.5)

## 2018-08-17 LAB — HEPARIN LEVEL (UNFRACTIONATED): HEPARIN UNFRACTIONATED: 0.16 [IU]/mL — AB (ref 0.30–0.70)

## 2018-08-17 MED ORDER — ALTEPLASE 2 MG IJ SOLR: 2.0000 mg | Freq: Once | INTRAMUSCULAR | Status: DC

## 2018-08-17 MED ORDER — ALTEPLASE 2 MG IJ SOLR
2.0000 mg | Freq: Once | INTRAMUSCULAR | Status: AC
  Administered 2018-08-17: 2 mg

## 2018-08-17 MED FILL — Heparin Sod (Porcine)-NaCl IV Soln 1000 Unit/500ML-0.9%: INTRAVENOUS | Qty: 500 | Status: AC

## 2018-08-17 NOTE — Progress Notes (Signed)
[name]    NEPHROLOGY PROGRESS NOTE  SUBJECTIVE:  Met with patient and family yesterday.  Had [name] yesterday, no intervention possible.  Dr Haroldine Laws d/w colleagues with other institutions and no medical options available.  Comfort care being arranged.   Pt seen in bed this AM - no particular concerns except asking how long the CRRT has to continue.  I let him know this decision was based on his [name].    OBJECTIVE:  Vitals:   08/17/18 1400 08/17/18 1415  BP:  (!) 81/57  Pulse:    Resp: (!) 24 (!) 21  Temp:    SpO2: 98%     Intake/Output Summary (Last 24 hours) at 08/17/2018 1523 Last data filed at 08/17/2018 1400 Gross per 24 hour  Intake 1571.71 ml  Output 1214 ml  Net 357.71 ml      Genearl:  AAOx3, chronically ill HEENT: MMM [place] AT anicteric sclera CV:  Heart RRR (+) systolic murmur Lungs:  Sl inc wob with conversation Abd:  abd SNT/ND with normal BS Extremities:  No LE edema. Skin:  No skin rash  MEDICATIONS:  . sodium chloride   Intravenous Once  . amiodarone  200 mg Oral BID  . aspirin EC  81 mg Oral Daily  . atorvastatin  80 mg Oral q1800  . chlorhexidine  15 mL Mouth Rinse BID  . Chlorhexidine Gluconate Cloth  6 each Topical Daily  . darbepoetin (ARANESP) injection - DIALYSIS  200 mcg Intravenous Q Thu-HD  . feeding supplement (ENSURE ENLIVE)  237 mL Oral BID BM  . feeding supplement (PRO-STAT SUGAR FREE 64)  30 mL Oral BID  . mouth rinse  15 mL Mouth Rinse BID  . mexiletine  150 mg Oral Q8H  . midodrine  15 mg Oral TID WC  . pantoprazole  40 mg Oral BID  . sodium chloride flush  3 mL Intravenous Q12H  . sodium chloride flush  3 mL Intravenous Q12H  . sodium chloride flush  3 mL Intravenous Q12H       LABS:   CBC Latest Ref Rng & Units 08/17/2018 08/04/2018 08/31/2018  WBC 4.0 - 10.5 K/uL 8.8 - -  Hemoglobin 13.0 - 17.0 g/dL 8.7(L) 10.5(L) 10.2(L)  Hematocrit 39.0 - 52.0 % 29.1(L) 31.0(L) 30.0(L)  Platelets 150 - 400 K/uL 59(L) - -     CMP Latest Ref Rng & Units 08/17/2018 08/06/2018 08/18/2018  Glucose 70 - 99 mg/dL 128(H) - -  BUN 8 - 23 mg/dL 43(H) - -  Creatinine 0.61 - 1.24 mg/dL 4.17(H) - -  Sodium 135 - 145 mmol/L 129(L) 131(L) 132(L)  Potassium 3.5 - 5.1 mmol/L 5.3(H) 5.1 4.9  Chloride 98 - 111 mmol/L 95(L) - -  CO2 22 - 32 mmol/L 22 - -  Calcium 8.9 - 10.3 mg/dL 7.7(L) - -  Total Protein 6.5 - 8.1 g/dL - - -  Total Bilirubin 0.3 - 1.2 mg/dL - - -  Alkaline Phos 38 - 126 U/L - - -  AST 15 - 41 U/L - - -  ALT 0 - 44 U/L - - -    Lab Results  Component Value Date   CALCIUM 7.7 (L) 08/17/2018   CAION 0.95 (L) 08/19/2018   PHOS 3.9 08/17/2018       Component Value Date/Time   COLORURINE YELLOW 07/16/2018 2208   APPEARANCEUR HAZY (A) 07/31/2018 2208   LABSPEC 1.013 07/06/2018 2208   PHURINE 5.0 07/16/2018 [place] 07/08/2018  [name] 07/15/2018 [name] 07/05/2018 [name] 07/14/2018 [name] 07/31/2018 2208   NITRITE NEGATIVE 07/05/2018 2208   LEUKOCYTESUR NEGATIVE 07/04/2018 2208      Component Value Date/Time   PHART 7.385 08/23/2018 1430   PCO2ART 34.7 08/31/2018 1430   PO2ART 75.0 (L) 08/18/2018 1430   HCO3 22.9 08/30/2018 1442   TCO2 24 08/05/2018 1442   ACIDBASEDEF 3.0 (H) 08/28/2018 1442   O2SAT 33.4 08/17/2018 0510       Component Value Date/Time   IRON 14 (L) 07/13/2018 1651   TIBC 458 (H) 07/24/2018 1651   FERRITIN 28 07/13/2018 1651   IRONPCTSAT 3 (L) 07/20/2018 1651       ASSESSMENT/PLAN:      1. Acute systolic heart failure ejection fraction 15% severe RV dysfunction with cardiogenic shock. Terminal condition.  Comfort care being recommended.   2. Coronary artery disease continues aspirin and statin.  LHC showed no interventions possible.   3. ESRD can continue CRRT as a life sustaining therapy until comfort measures pursued.  His circuit was clotting when I was in the room and  depending on timeline for comfort care can resume or not.   Let me know if I can help in any way.   Jannifer Hick MD [facility]

## 2018-08-17 NOTE — Progress Notes (Addendum)
Patient ID: Samuel Bates, male   DOB: [birth year], 63 y.o.   MRN: [id number]     Advanced Heart Failure Rounding Note  PCP-Cardiologist: No primary care provider on file.   Subjective:    Cath 2/13 with occluded RCA and LAD low output (co-ox 34%) and elevated filling pressures  Remains on NE 52 and dobutamine 2.5.  Feels weak. Mildly uremic. Nauseated. Anuric. Off CRRT due to clotted catheter  Objective:   Weight Range: 67.8 kg Body mass index is 18.68 kg/m.   Vital Signs:   Temp:  [97.3 F (36.3 C)-98.6 F (37 C)] 97.3 F (36.3 C) (02/14 0400) Pulse Rate:  [77-122] 77 (02/14 0345) Resp:  [10-31] 16 (02/14 0730) BP: (71-109)/(52-91) 71/54 (02/14 0730) SpO2:  [76 %-100 %] 99 % (02/14 0700) Weight:  [67.8 kg] 67.8 kg (02/14 0600) Last BM Date: 08/10/2018  Weight change: Filed Weights   08/15/18 0545 08/11/2018 0500 08/17/18 0600  Weight: 67.6 kg 67.3 kg 67.8 kg   Intake/Output:   Intake/Output Summary (Last 24 hours) at 08/17/2018 0759 Last data filed at 08/17/2018 0700 Gross per 24 hour  Intake 1151.83 ml  Output 868 ml  Net 283.83 ml    Physical Exam   General:  Weak appearing. No resp difficulty HEENT: normal Neck: supple. JVP to ear Carotids 2+ bilat; no bruits. No lymphadenopathy or thryomegaly appreciated. Cor: PMI laterally displaced. Regular rate & rhythm. +s3 Lungs: mild crackles Abdomen: soft, nontender, + distended. No hepatosplenomegaly. No bruits or masses. Good bowel sounds. Extremities: no cyanosis, clubbing, rash, 1+ edema Neuro: alert & orientedx3, cranial nerves grossly intact. moves all 4 extremities w/o difficulty. Affect pleasant   Telemetry   SR 70-80s + PVCs personally reviewed.  Labs    CBC Recent Labs    08/05/2018 0327  08/21/2018 1442 08/17/18 0248  WBC 11.0*  --   --  8.8  HGB 9.0*   < > 10.5* 8.7*  HCT 30.4*   < > 31.0* 29.1*  MCV 77.4*  --   --  78.2*  PLT 79*  --   --  59*   < > = values in this interval not displayed.    Basic Metabolic Panel Recent Labs    08/27/2018 0327  08/17/2018 1442 08/17/18 0248  NA 133*   < > 131* 129*  K 5.0   < > 5.1 5.3*  CL 97*  --   --  95*  CO2 21*  --   --  22  GLUCOSE 108*  --   --  128*  BUN 29*  --   --  43*  CREATININE 2.87*  --   --  4.17*  CALCIUM 7.6*  --   --  7.7*  MG 2.6*  --   --  2.6*  PHOS 2.5  --   --  3.9   < > = values in this interval not displayed.   Liver Function Tests Recent Labs    08/15/2018 0327 08/17/18 0248  ALBUMIN 2.3* 2.3*   No results for input(s): LIPASE, AMYLASE in the last 72 hours. Cardiac Enzymes No results for input(s): CKTOTAL, CKMB, CKMBINDEX, TROPONINI in the last 72 hours.  BNP: BNP (last 3 results) Recent Labs    07/16/2018 1651  BNP 4,188.4*    ProBNP (last 3 results) No results for input(s): PROBNP in the last 8760 hours.   D-Dimer No results for input(s): DDIMER in the last 72 hours. Hemoglobin A1C No results for input(s): HGBA1C in the  last 72 hours. Fasting Lipid Panel No results for input(s): CHOL, HDL, LDLCALC, TRIG, CHOLHDL, LDLDIRECT in the last 72 hours. Thyroid Function Tests No results for input(s): TSH, T4TOTAL, T3FREE, THYROIDAB in the last 72 hours.  Invalid input(s): FREET3  Other results:   Imaging    No results found.   Medications:     Scheduled Medications: . sodium chloride   Intravenous Once  . amiodarone  200 mg Oral BID  . aspirin EC  81 mg Oral Daily  . atorvastatin  80 mg Oral q1800  . chlorhexidine  15 mL Mouth Rinse BID  . Chlorhexidine Gluconate Cloth  6 each Topical Daily  . darbepoetin (ARANESP) injection - DIALYSIS  200 mcg Intravenous Q Thu-HD  . feeding supplement (ENSURE ENLIVE)  237 mL Oral BID BM  . feeding supplement (PRO-STAT SUGAR FREE 64)  30 mL Oral BID  . mouth rinse  15 mL Mouth Rinse BID  . mexiletine  150 mg Oral Q8H  . midodrine  15 mg Oral TID WC  . pantoprazole  40 mg Oral BID  . sodium chloride flush  3 mL Intravenous Q12H  . sodium  chloride flush  3 mL Intravenous Q12H  . sodium chloride flush  3 mL Intravenous Q12H    Infusions: .  prismasol BGK 4/2.5 200 mL/hr at 08/17/18 0021  .  prismasol BGK 4/2.5 200 mL/hr at 08/17/18 0020  . sodium chloride    . sodium chloride Stopped (08/14/18 0727)  . sodium chloride    . sodium chloride    . sodium chloride    . sodium chloride    . DOBUTamine 2.5 mcg/kg/min (08/17/18 0700)  . heparin 10,000 units/ 20 mL infusion syringe 1,750 Units/hr (08/17/18 0756)  . norepinephrine (LEVOPHED) Adult infusion 52 mcg/min (08/17/18 0700)  . prismasol BGK 4/2.5 2,000 mL/hr at 08/17/18 0415    PRN Medications: Place/Maintain arterial line **AND** sodium chloride, sodium chloride, sodium chloride, sodium chloride, sodium chloride, sodium chloride, acetaminophen, heparin, loperamide HCl, magic mouthwash, nitroGLYCERIN, ondansetron (ZOFRAN) IV, promethazine, promethazine, sodium chloride, sodium chloride flush, sodium chloride flush, sodium chloride flush   Patient Profile   63 y.o.malewith past medical history of hypertension, hyperlipidemia admitted with recent but late presenting inferior lateral myocardial infarction, cardiogenic shock, acute renal failure and microcytic anemia. Echocardiogram showed severe LV and RV dysfunction and moderate mitral regurgitation.  Assessment/Plan   1. Acute systolic HF with cardiogenic shock due to OOH MI: Ischemic cardiomyopathy, echo 1/5 EF 15% severe RV dysfunction and moderate MR.   - Now on norepi 52 mcg + dobutamine 2.5 mcg + midodrine 15 mg tid.  - Co-ox persistently low at 33% c/w cardiogenic shock despite high-dose NE and dobutamine.  - Cath 2/13 with occluded LAD and RCA - I discussed case with our interventional team, TCTS and the [facility]. We all agree that in light of his ESRD and recent tobacco use there are no durable options for survival.  - I discussed code status and comfort care with patient and his family and  they are discussing further.   2. CAD: s/p OOH inferolateral MI. Initial trop 28, then trended down.  No chest pain.   - Continue ASA and statin. No b-blocker with shock.  - Cath 2/13 with occluded LAD and RCA. No interventional options   3. AKI/ESRD with uremia: likely due to ATN and hypoperfusion. Initial BUN/CR 198/3.91. Baseline creatinine normal several months ago. - Renal following CRRT started on 1/25, now on CVVHD.  Anuric  - Renal feels that his kidneys are unlikely to recover. I agree. CRRT on hold with clotted cath  4. Acute hypoxic respiratory failure: CXR with bilateral interstitial edema - Remains on 4L HFNC. Continue to wean oxygen as tolerated.   5. Microcytic anemia due to IDA - Iron stores low. Has received Feraheme - Will eventually need GI w/u - Continue protonix  6. Shock liver  7. VT: Quiescent on amiodarone gtt and PO mexilitene. No further VT.   8. Ab pain and BRBPR - suspect ischemic colitis  9. Thrombocytopenia - PLTs dropping. No bleeding. Check HIT  He remains critically ill with poor cardiac output/cardiogenic shock despite 2 pressors. I was hopeful that his kidneys would recover to allow [place] to consider advanced HF therapies but he has had no signs of renal recovery despite nearly 2 weeks of aggressive care. Without renal recovery he will require HD but his heart currently too weak to tolerate. Cath showed no options for interventions. Comfort care only option. Family discussions ongoing,   CRITICAL CARE Performed by: Glori Bickers  Total critical care time: 35 minutes  Critical care time was exclusive of separately billable procedures and treating other patients.  Critical care was necessary to treat or prevent imminent or life-threatening deterioration.  Critical care was time spent personally by me (independent of midlevel providers or residents) on the following activities: development of treatment plan with patient and/or surrogate as well  as nursing, discussions with consultants, evaluation of patient's response to treatment, examination of patient, obtaining history from patient or surrogate, ordering and performing treatments and interventions, ordering and review of laboratory studies, ordering and review of radiographic studies, pulse oximetry and re-evaluation of patient's condition.  Glori Bickers, MD  7:59 AM

## 2018-08-17 NOTE — Progress Notes (Signed)
   08/17/18 1400  Clinical Encounter Type  Visited With Patient and family together  Visit Type Initial  Spoke with patient and son. Patient want son to get power of attorney papers for financial matters. Son is going to get forms and bring back for his father to sign. Patient is fully aware of his condition and wants this in place for his son to be able to handle business now and in the future. Provided spiritual support.

## 2018-08-17 NOTE — Progress Notes (Signed)
CRRT pump pressures increasing. Treatment stopped and blood returned to patient. Family requested to hold on restarting CRRT until they discuss goals of treatment. Dr. Haroldine Laws at bedside to evaluate patient and will return later to discuss decisions regarding patient code status. Will continue to monitor.

## 2018-08-17 NOTE — Progress Notes (Addendum)
Daily Progress Note   Patient Name: Samuel Bates       Date: 08/17/2018 DOB: [birth year]  Age: 63 y.o. MRN#: [id number] Attending Physician: Lelon Perla, MD Primary Care Physician: Saintclair Halsted, FNP Admit Date: 08/03/2018  Reason for Consultation/Follow-up: Establishing goals of care, Non pain symptom management and Psychosocial/spiritual support  Subjective: Spoke to patient and son at bedside this morning. We discussed ongoing family discussion about goals of care and advanced directives. Patient states that Dr. Haroldine Laws has given him a lot to think about. Samuel Bates says today he would like to, "drink a quart of fluid to see if he will be able to wake up his kidneys". He continues to complain of nausea.   Samuel Bates states, "I do not want a breathing tube". We discussed code status further today. We specifically discussed the process of CPR and what that would look like for Samuel Bates with his co-morbidities. Frankly, but compassionately explained that if his heart were to stop, CPR would cause suffering and damage with virtually no chance of survival due to his severe thrombocytopenia, GI bleed, heart failure and ESRD. Samuel Bates provided understanding that we are talking about DNR and states this is something he will have to think about.  Samuel Bates described a cousin that had a 3V CABG and asked why can't I have that.  We talked together about his hypotension and kidney failure.  I explained that he is not a candidate for general anesthesia or surgery.  Samuel Bates's son states they will be having a family meeting with Dr. Haroldine Laws around 3-4pm today and they would like to wait to discuss any further decisions until everyone is present.   Assessment: ESRD, advanced heart failure (EF of 10-15%), complete  occlusion of LAD and RCA, active GI bleed, low BP requiring two pressors.    Patient Profile/HPI: 63 y.o. male  with past medical history of hypertension and hyperlipidemia who was admitted on 07/14/2018 with an MI that had most likely being going on for several days.   Work up revealed acute renal failure, an EF of 10-15%, and subsequently cardiogenic shock. Patient remains on CRRT, multiple pressors (still with soft blood pressure), he is having difficulty with nausea and vomiting.  He is eating very little.   Length of Stay: 20  Current Medications: Scheduled Meds:  . sodium  chloride   Intravenous Once  . amiodarone  200 mg Oral BID  . aspirin EC  81 mg Oral Daily  . atorvastatin  80 mg Oral q1800  . chlorhexidine  15 mL Mouth Rinse BID  . Chlorhexidine Gluconate Cloth  6 each Topical Daily  . darbepoetin (ARANESP) injection - DIALYSIS  200 mcg Intravenous Q Thu-HD  . feeding supplement (ENSURE ENLIVE)  237 mL Oral BID BM  . feeding supplement (PRO-STAT SUGAR FREE 64)  30 mL Oral BID  . mouth rinse  15 mL Mouth Rinse BID  . mexiletine  150 mg Oral Q8H  . midodrine  15 mg Oral TID WC  . pantoprazole  40 mg Oral BID  . sodium chloride flush  3 mL Intravenous Q12H  . sodium chloride flush  3 mL Intravenous Q12H  . sodium chloride flush  3 mL Intravenous Q12H    Continuous Infusions: .  prismasol BGK 4/2.5 200 mL/hr at 08/17/18 0021  .  prismasol BGK 4/2.5 200 mL/hr at 08/17/18 0020  . sodium chloride    . sodium chloride Stopped (08/14/18 0727)  . sodium chloride    . sodium chloride    . sodium chloride    . sodium chloride    . DOBUTamine 2.5 mcg/kg/min (08/17/18 1100)  . heparin 10,000 units/ 20 mL infusion syringe 1,800 Units/hr (08/17/18 0809)  . norepinephrine (LEVOPHED) Adult infusion 52 mcg/min (08/17/18 1100)  . prismasol BGK 4/2.5 2,000 mL/hr at 08/17/18 0415    PRN Meds: Place/Maintain arterial line **AND** sodium chloride, sodium chloride, sodium chloride,  sodium chloride, sodium chloride, sodium chloride, acetaminophen, heparin, loperamide HCl, magic mouthwash, nitroGLYCERIN, ondansetron (ZOFRAN) IV, promethazine, promethazine, sodium chloride, sodium chloride flush, sodium chloride flush, sodium chloride flush  Physical Exam   General: elderly, thin male, in no acute distress Resp: regular rate, normal effort Cardiac: regular rate  Vital Signs: BP (!) 86/63   Pulse 77   Temp 97.6 F (36.4 C) (Oral)   Resp 14   Ht 6\' 3"  (1.905 m)   Wt 67.8 kg   SpO2 99%   BMI 18.68 kg/m  SpO2: SpO2: 99 % O2 Device: O2 Device: Nasal Cannula O2 Flow Rate: O2 Flow Rate (L/min): 6 L/min  Intake/output summary:   Intake/Output Summary (Last 24 hours) at 08/17/2018 1158 Last data filed at 08/17/2018 1100 Gross per 24 hour  Intake 1357.01 ml  Output 1214 ml  Net 143.01 ml   LBM: Last BM Date: 08/15/2018 Baseline Weight: Weight: 89.8 kg Most recent weight: Weight: 67.8 kg       Palliative Assessment/Data: 20%    Flowsheet Rows     Most Recent Value  Intake Tab  Referral Department  Cardiology  Unit at Time of Referral  ICU  Palliative Care Primary Diagnosis  Cardiac  Date Notified  08/15/18  Palliative Care Type  New Palliative care  Reason for referral  Clarify Goals of Care  Date of Admission  07/18/2018  Date first seen by Palliative Care  08/15/18  # of days Palliative referral response time  0 Day(s)  # of days IP prior to Palliative referral  18  Clinical Assessment  Psychosocial & Spiritual Assessment  Palliative Care Outcomes      Patient Active Problem List   Diagnosis Date Noted  . ACS (acute coronary syndrome) ([place])   . Palliative care encounter   . Protein-calorie malnutrition, severe 08/14/2018  . Cardiogenic shock ([place]) 08/04/2018  . Acute gout 08/04/2018  . VT (  ventricular tachycardia) ([place]) 08/04/2018  . NSTEMI (non-ST elevated myocardial infarction) ([place]) 07/27/2018  . Acute renal failure ([place])   . Hx of  adenomatous colonic polyps 03/07/2017    Palliative Care Plan    Recommendations/Plan:  LIMITED CODE: DO NOT INUBATE  Further family discussion about goals of care planned for this afternoon  We will follow up tomorrow  Full Scope Treatment at this time  Phenergan added yesterday for nausea PRN  Goals of Care and Additional Recommendations:  Limitations on Scope of Treatment: Full Scope Treatment  Code Status:  Limited code NO INTUBATION  Prognosis:   High risk for acute event due to CHF, severe CAD, ESRD, and thrombocytopenia. Will depend on decision to pursue comfort care measures. If comfort care, anticipate hospital death.   Discharge Planning:  To Be Determined  Care plan was discussed with patient, family, RN  Thank you for allowing the Palliative Medicine Team to assist in the care of this patient.  Total time spent:  35 minutes     Greater than 50%  of this time was spent counseling and coordinating care related to the above assessment and plan.   Ferrel Logan, PA-S 08/17/2018  Florentina Jenny, PA-C Palliative Medicine  Please contact Palliative MedicineTeam phone at [phone] for questions and concerns between 7 am - 7 pm.   Please see AMION for individual provider pager numbers.

## 2018-08-17 NOTE — Progress Notes (Signed)
  Long discussion tonight with patient and his family.   He remains critically ill on high-dose pressors. CRRT off today due to line clotting.   We discussed his situation at length. He understands that he has few options but is hopeful that he may get a miracle and have his kidney function return.   However he is clear that he would not want intubation or CPR. (shock ok). If getting worse would want to start comfort meds.   He would like to continue CRRT for now to give kidneys more time to recover as long as BP can tolerate.  Code Status changed.   Additional CCT 45 mins  Glori Bickers, MD  7:52 PM

## 2018-08-18 LAB — CBC
HCT: 29.1 % — ABNORMAL LOW (ref 39.0–52.0)
HEMOGLOBIN: 8.9 g/dL — AB (ref 13.0–17.0)
MCH: 23.5 pg — ABNORMAL LOW (ref 26.0–34.0)
MCHC: 30.6 g/dL (ref 30.0–36.0)
MCV: 77 fL — ABNORMAL LOW (ref 80.0–100.0)
Platelets: 45 10*3/uL — ABNORMAL LOW (ref 150–400)
RBC: 3.78 MIL/uL — ABNORMAL LOW (ref 4.22–5.81)
RDW: 30.1 % — ABNORMAL HIGH (ref 11.5–15.5)
WBC: 9.3 10*3/uL (ref 4.0–10.5)
nRBC: 173.6 % — ABNORMAL HIGH (ref 0.0–0.2)

## 2018-08-18 LAB — RENAL FUNCTION PANEL
Albumin: 2.3 g/dL — ABNORMAL LOW (ref 3.5–5.0)
Albumin: 2.3 g/dL — ABNORMAL LOW (ref 3.5–5.0)
Anion gap: 12 (ref 5–15)
Anion gap: 15 (ref 5–15)
BUN: 43 mg/dL — ABNORMAL HIGH (ref 8–23)
BUN: 48 mg/dL — ABNORMAL HIGH (ref 8–23)
CHLORIDE: 95 mmol/L — AB (ref 98–111)
CO2: 22 mmol/L (ref 22–32)
CO2: 20 mmol/L — ABNORMAL LOW (ref 22–32)
Calcium: 7.7 mg/dL — ABNORMAL LOW (ref 8.9–10.3)
Calcium: 7.6 mg/dL — ABNORMAL LOW (ref 8.9–10.3)
Chloride: 95 mmol/L — ABNORMAL LOW (ref 98–111)
Creatinine, Ser: 4.17 mg/dL — ABNORMAL HIGH (ref 0.61–1.24)
Creatinine, Ser: 4.56 mg/dL — ABNORMAL HIGH (ref 0.61–1.24)
GFR calc Af Amer: 17 mL/min — ABNORMAL LOW (ref >60)
GFR calc Af Amer: 15 mL/min — ABNORMAL LOW (ref >60)
GFR calc non Af Amer: 14 mL/min — ABNORMAL LOW (ref >60)
GFR calc non Af Amer: 13 mL/min — ABNORMAL LOW (ref >60)
Glucose, Bld: 128 mg/dL — ABNORMAL HIGH (ref 70–99)
Glucose, Bld: 119 mg/dL — ABNORMAL HIGH (ref 70–99)
Phosphorus: 3.9 mg/dL (ref 2.5–4.6)
Phosphorus: 4.7 mg/dL — ABNORMAL HIGH (ref 2.5–4.6)
Potassium: 5.3 mmol/L — ABNORMAL HIGH (ref 3.5–5.1)
Potassium: 5.7 mmol/L — ABNORMAL HIGH (ref 3.5–5.1)
Sodium: 129 mmol/L — ABNORMAL LOW (ref 135–145)
Sodium: 130 mmol/L — ABNORMAL LOW (ref 135–145)

## 2018-08-18 LAB — POCT ACTIVATED CLOTTING TIME
ACTIVATED CLOTTING TIME: 186 s
Activated Clotting Time: 180 seconds
Activated Clotting Time: 180 seconds
Activated Clotting Time: 180 seconds
Activated Clotting Time: 180 seconds
Activated Clotting Time: 169 seconds
Activated Clotting Time: 175 seconds
Activated Clotting Time: 175 seconds
Activated Clotting Time: 186 seconds
Activated Clotting Time: 191 seconds
Activated Clotting Time: 186 seconds
Activated Clotting Time: 191 seconds
Activated Clotting Time: 213 seconds
Activated Clotting Time: 208 seconds
Activated Clotting Time: 191 seconds

## 2018-08-18 LAB — COOXEMETRY PANEL
Carboxyhemoglobin: 1.3 % (ref 0.5–1.5)
Methemoglobin: 1.6 % — ABNORMAL HIGH (ref 0.0–1.5)
O2 Saturation: 35.6 %
TOTAL HEMOGLOBIN: 8.6 g/dL — AB (ref 12.0–16.0)

## 2018-08-18 LAB — MAGNESIUM: MAGNESIUM: 2.7 mg/dL — AB (ref 1.7–2.4)

## 2018-08-18 MED ORDER — HEPARIN SODIUM (PORCINE) 1000 UNIT/ML DIALYSIS: 1000.0000 [IU] | INTRAMUSCULAR | Status: DC | PRN

## 2018-08-18 MED ORDER — PRISMASOL BGK 0/2.5 32-2.5 MEQ/L IV SOLN
INTRAVENOUS | Status: DC
  Administered 2018-08-18 – 2018-08-20 (×15): via INTRAVENOUS_CENTRAL
  Filled 2018-08-18 (×18): qty 5000

## 2018-08-18 MED ORDER — PRISMASOL BGK 0/2.5 32-2.5 MEQ/L IV SOLN
INTRAVENOUS | Status: DC
  Administered 2018-08-18 – 2018-08-19 (×2): via INTRAVENOUS_CENTRAL
  Filled 2018-08-18 (×3): qty 5000

## 2018-08-18 NOTE — Progress Notes (Addendum)
NO CHARGE NOTE  Visited with [name] this morning. He reports he is tired and cold. Specifically, c/o frostbite sensation in hands and feet. Discussed we could treat this with pain medication, but the medicine would drop his blood pressure. He says he understands, but is not ready to switch to more comfortable measures.  He tells me today, "It is what it is. I have accepted that Jesus is my savior".   Provided emotional support.  Ferrel Logan, PA-S Florentina Jenny, PA-C Palliative Medicine Team 08/18/2018

## 2018-08-18 NOTE — Progress Notes (Signed)
[facility]    NEPHROLOGY PROGRESS NOTE  SUBJECTIVE:   Remains on CRRT, vasopressor/inotrope support hoping for miracle.   This AM having 'frostbite' sensation in fingers and toes no NE gtt 51s.   Sleepy this AM.  Overall comfortable.    OBJECTIVE:  Vitals:   08/18/18 0900 08/18/18 0930  BP: (!) 87/67 (!) 82/63  Pulse:    Resp: (!) 21 14  Temp:    SpO2:      Intake/Output Summary (Last 24 hours) at 08/18/2018 0951 Last data filed at 08/18/2018 0900 Gross per 24 hour  Intake 1386.14 ml  Output 697 ml  Net 689.14 ml      [place]:  sleepy, chronically ill HEENT: MMM [place] AT anicteric sclera CV:  Heart RRR (+) systolic murmur Lungs:  Sl inc wob with conversation Abd:  abd SNT/ND with normal BS Extremities:  No LE edema. Skin:  No skin rash  MEDICATIONS:  . sodium chloride   Intravenous Once  . amiodarone  200 mg Oral BID  . aspirin EC  81 mg Oral Daily  . atorvastatin  80 mg Oral q1800  . chlorhexidine  15 mL Mouth Rinse BID  . Chlorhexidine Gluconate Cloth  6 each Topical Daily  . darbepoetin (ARANESP) injection - DIALYSIS  200 mcg Intravenous Q Thu-HD  . feeding supplement (ENSURE ENLIVE)  237 mL Oral BID BM  . feeding supplement (PRO-STAT SUGAR FREE 64)  30 mL Oral BID  . mouth rinse  15 mL Mouth Rinse BID  . mexiletine  150 mg Oral Q8H  . midodrine  15 mg Oral TID WC  . pantoprazole  40 mg Oral BID  . sodium chloride flush  3 mL Intravenous Q12H  . sodium chloride flush  3 mL Intravenous Q12H       LABS:   CBC Latest Ref Rng & Units 08/18/2018 08/17/2018 08/08/2018  WBC 4.0 - 10.5 K/uL 9.3 8.8 -  Hemoglobin 13.0 - 17.0 g/dL 8.9(L) 8.7(L) 10.5(L)  Hematocrit 39.0 - 52.0 % 29.1(L) 29.1(L) 31.0(L)  Platelets 150 - 400 K/uL 45(L) 59(L) -    CMP Latest Ref Rng & Units 08/18/2018 08/17/2018 08/15/2018  Glucose 70 - 99 mg/dL 119(H) 128(H) -  BUN 8 - 23 mg/dL 48(H) 43(H) -  Creatinine 0.61 - 1.24 mg/dL 4.56(H) 4.17(H) -  Sodium 135 - 145 mmol/L  130(L) 129(L) 131(L)  Potassium 3.5 - 5.1 mmol/L 5.7(H) 5.3(H) 5.1  Chloride 98 - 111 mmol/L 95(L) 95(L) -  CO2 22 - 32 mmol/L 20(L) 22 -  Calcium 8.9 - 10.3 mg/dL 7.6(L) 7.7(L) -  Total Protein 6.5 - 8.1 g/dL - - -  Total Bilirubin 0.3 - 1.2 mg/dL - - -  Alkaline Phos 38 - 126 U/L - - -  AST 15 - 41 U/L - - -  ALT 0 - 44 U/L - - -    Lab Results  Component Value Date   CALCIUM 7.6 (L) 08/18/2018   CAION 0.95 (L) 08/06/2018   PHOS 4.7 (H) 08/18/2018       Component Value Date/Time   COLORURINE YELLOW 07/18/2018 2208   APPEARANCEUR HAZY (A) 07/11/2018 2208   LABSPEC 1.013 07/04/2018 2208   PHURINE 5.0 07/18/2018 [place] 07/17/2018 [place] 07/09/2018 [employer] 07/10/2018 [place] 07/24/2018 [place] 07/21/2018 2208   NITRITE NEGATIVE 07/10/2018 2208   LEUKOCYTESUR NEGATIVE 08/01/2018 2208  Component Value Date/Time   PHART 7.385 08/22/2018 1430   PCO2ART 34.7 08/17/2018 1430   PO2ART 75.0 (L) 08/05/2018 1430   HCO3 22.9 08/15/2018 1442   TCO2 24 08/19/2018 1442   ACIDBASEDEF 3.0 (H) 08/29/2018 1442   O2SAT 35.6 08/18/2018 0605       Component Value Date/Time   IRON 14 (L) 07/06/2018 1651   TIBC 458 (H) 07/17/2018 1651   FERRITIN 28 07/11/2018 1651   IRONPCTSAT 3 (L) 07/22/2018 1651       ASSESSMENT/PLAN:      1. Acute systolic heart failure ejection fraction 15% severe RV dysfunction with cardiogenic shock. Terminal condition.  Comfort care being recommended.  He is now limited resuscitation.   2. Coronary artery disease continues aspirin and statin.  LHC showed no interventions possible.   3. ESRD can continue CRRT as a life sustaining therapy until comfort measures pursued.  He's having some hyperkalemia which is likely related to poor tissue perfusion in setting of high dose NE.  Will switch to 2K dialysate to control.  Cardiology did dose with lokelma this AM.   Let  me know if I can help in any way.   Jannifer Hick MD [facility]

## 2018-08-18 NOTE — Progress Notes (Signed)
Patient ID: Samuel Bates, male   DOB: [birth year], 63 y.o.   MRN: [id number]     Advanced Heart Failure Rounding Note  PCP-Cardiologist: No primary care provider on file.   Subjective:    Cath 2/13 with occluded RCA and LAD low output (co-ox 34%) and elevated filling pressures  Remains on NE 52 and dobutamine 2.5.Co-ox remains 35%.  Continues to have problems with HD catheter. Weight up 3 pounds. CVP 17  K 5.7  PLTs 59-> 45k.   Weak. Still with ab pain and nausea. Developing neuropathy in fingers and toes. Says he is not uncomfortable.   Objective:   Weight Range: 69.3 kg Body mass index is 19.1 kg/m.   Vital Signs:   Temp:  [97.2 F (36.2 C)-98.8 F (37.1 C)] 97.9 F (36.6 C) (02/15 0846) Pulse Rate:  [83] 83 (02/15 0800) Resp:  [11-28] 18 (02/15 0830) BP: (71-95)/(53-76) 84/69 (02/15 0830) SpO2:  [92 %-100 %] 92 % (02/15 0800) Weight:  [69.3 kg] 69.3 kg (02/15 0600) Last BM Date: 08/05/2018  Weight change: Filed Weights   08/27/2018 0500 08/17/18 0600 08/18/18 0600  Weight: 67.3 kg 67.8 kg 69.3 kg   Intake/Output:   Intake/Output Summary (Last 24 hours) at 08/18/2018 0907 Last data filed at 08/18/2018 0900 Gross per 24 hour  Intake 1386.14 ml  Output 697 ml  Net 689.14 ml    Physical Exam   General:  Weak appearing. No resp difficulty HEENT: normal Neck: supple. no JVP to ear . Carotids 2+ bilat; no bruits. No lymphadenopathy or thryomegaly appreciated. Cor: PMI nondisplaced. Regular rate & rhythm. Prominent s3 Right chest perm cath Lungs: decreased at bases Abdomen: soft, nontender, nondistended. No hepatosplenomegaly. No bruits or masses. Good bowel sounds. Extremities: no cyanosis, clubbing, rash, 1+ edema Neuro: alert & orientedx3, cranial nerves grossly intact. moves all 4 extremities w/o difficulty. Affect pleasant   Telemetry   SR 80s+ PVCs personally reviewed.  Labs    CBC Recent Labs    08/17/18 0248 08/18/18 0325  WBC 8.8 9.3  HGB 8.7*  8.9*  HCT 29.1* 29.1*  MCV 78.2* 77.0*  PLT 59* 45*   Basic Metabolic Panel Recent Labs    08/17/18 0248 08/18/18 0325  NA 129* 130*  K 5.3* 5.7*  CL 95* 95*  CO2 22 20*  GLUCOSE 128* 119*  BUN 43* 48*  CREATININE 4.17* 4.56*  CALCIUM 7.7* 7.6*  MG 2.6* 2.7*  PHOS 3.9 4.7*   Liver Function Tests Recent Labs    08/17/18 0248 08/18/18 0325  ALBUMIN 2.3* 2.3*   No results for input(s): LIPASE, AMYLASE in the last 72 hours. Cardiac Enzymes No results for input(s): CKTOTAL, CKMB, CKMBINDEX, TROPONINI in the last 72 hours.  BNP: BNP (last 3 results) Recent Labs    07/21/2018 1651  BNP 4,188.4*    ProBNP (last 3 results) No results for input(s): PROBNP in the last 8760 hours.   D-Dimer No results for input(s): DDIMER in the last 72 hours. Hemoglobin A1C No results for input(s): HGBA1C in the last 72 hours. Fasting Lipid Panel No results for input(s): CHOL, HDL, LDLCALC, TRIG, CHOLHDL, LDLDIRECT in the last 72 hours. Thyroid Function Tests No results for input(s): TSH, T4TOTAL, T3FREE, THYROIDAB in the last 72 hours.  Invalid input(s): FREET3  Other results:   Imaging    No results found.   Medications:     Scheduled Medications: . sodium chloride   Intravenous Once  . amiodarone  200 mg Oral BID  .  aspirin EC  81 mg Oral Daily  . atorvastatin  80 mg Oral q1800  . chlorhexidine  15 mL Mouth Rinse BID  . Chlorhexidine Gluconate Cloth  6 each Topical Daily  . darbepoetin (ARANESP) injection - DIALYSIS  200 mcg Intravenous Q Thu-HD  . feeding supplement (ENSURE ENLIVE)  237 mL Oral BID BM  . feeding supplement (PRO-STAT SUGAR FREE 64)  30 mL Oral BID  . mouth rinse  15 mL Mouth Rinse BID  . mexiletine  150 mg Oral Q8H  . midodrine  15 mg Oral TID WC  . pantoprazole  40 mg Oral BID  . sodium chloride flush  3 mL Intravenous Q12H  . sodium chloride flush  3 mL Intravenous Q12H    Infusions: .  prismasol BGK 4/2.5 200 mL/hr at 08/17/18 0021  .   prismasol BGK 4/2.5 200 mL/hr at 08/17/18 0020  . sodium chloride    . sodium chloride Stopped (08/14/18 0727)  . sodium chloride    . sodium chloride    . sodium chloride    . DOBUTamine 2.5 mcg/kg/min (08/18/18 0830)  . heparin 10,000 units/ 20 mL infusion syringe 1,400 Units/hr (08/18/18 0854)  . norepinephrine (LEVOPHED) Adult infusion 56 mcg/min (08/18/18 0828)  . prismasol BGK 4/2.5 2,000 mL/hr at 08/18/18 0815    PRN Medications: Place/Maintain arterial line **AND** sodium chloride, sodium chloride, sodium chloride, sodium chloride, sodium chloride, acetaminophen, heparin, loperamide HCl, magic mouthwash, nitroGLYCERIN, ondansetron (ZOFRAN) IV, promethazine, promethazine, sodium chloride, sodium chloride flush, sodium chloride flush   Patient Profile   63 y.o.malewith past medical history of hypertension, hyperlipidemia admitted with recent but late presenting inferior lateral myocardial infarction, cardiogenic shock, acute renal failure and microcytic anemia. Echocardiogram showed severe LV and RV dysfunction and moderate mitral regurgitation.  Assessment/Plan   1. Acute systolic HF with cardiogenic shock due to OOH MI: Ischemic cardiomyopathy, echo 1/5 EF 15% severe RV dysfunction and moderate MR.   - Now on norepi 52 mcg + dobutamine 2.5 mcg + midodrine 15 mg tid.  - Co-ox persistently low at 53% c/w cardiogenic shock despite high-dose NE and dobutamine.  - Cath 2/13 with occluded LAD and RCA - I discussed case with our interventional team, TCTS and the [facility]. We all agree that in light of his ESRD and recent tobacco use there are no durable options for survival.  - I discussed code status and comfort care with patient and his family several times. Now limited code as below. Want to kee trying CRRT as tolerated to give kidneys more time to possibly recover   2. CAD: s/p OOH inferolateral MI. Initial trop 28, then trended down.  No chest pain.   - Continue  ASA and statin. No b-blocker with shock.  - Cath 2/13 with occluded LAD and RCA. No interventional options   3. AKI/ESRD with uremia: likely due to ATN and hypoperfusion. Initial BUN/CR 198/3.91. Baseline creatinine normal several months ago. - Renal following CRRT started on 1/25, now on CVVHD. Anuric  - Renal feels that his kidneys are unlikely to recover. I agree. - CRRT has been ineffective due to recurrent clotting of catheter  4. Acute hypoxic respiratory failure due to acute systolic HF: CXR with bilateral interstitial edema - Remains on HFNC.  - Continue to try to pull fluid with CRRT  5. Shock liver  6. VT: Quiescent on amiodarone gtt and PO mexilitene. No further VT.   7. Ab pain and BRBPR - suspect ischemic colitis  8. Thrombocytopenia - PLTs dropping. No bleeding. Check HIT. Heparin is only in HD circuit. Will switch to citrate - Discussedwith PharmD personally.  9. Hyperkalemia - continue CRRT as tolerated - wil lgive one dose lokelma  10. Limited code - No intubation, CPR or code meds - DC-CV, pressors and Bipap ok  He remains critically ill with poor cardiac output/cardiogenic shock despite 2 pressors. I was hopeful that his kidneys would recover to allow [place] to consider advanced HF therapies but he has had no signs of renal recovery despite over 3 weeks of aggressive care. Without renal recovery he will require HD but his heart currently too weak to tolerate. Cath showed no options for interventions. Now limited code. Will need to transition to comfort care soon particularly if HD ineffective.   CRITICAL CARE Performed by: Glori Bickers  Total critical care time: 45 minutes  Critical care time was exclusive of separately billable procedures and treating other patients.  Critical care was necessary to treat or prevent imminent or life-threatening deterioration.  Critical care was time spent personally by me (independent of midlevel providers or residents)  on the following activities: development of treatment plan with patient and/or surrogate as well as nursing, discussions with consultants, evaluation of patient's response to treatment, examination of patient, obtaining history from patient or surrogate, ordering and performing treatments and interventions, ordering and review of laboratory studies, ordering and review of radiographic studies, pulse oximetry and re-evaluation of patient's condition.  Glori Bickers, MD  9:07 AM

## 2018-08-19 LAB — CBC
HCT: 27.1 % — ABNORMAL LOW (ref 39.0–52.0)
Hemoglobin: 8.1 g/dL — ABNORMAL LOW (ref 13.0–17.0)
MCH: 23.6 pg — AB (ref 26.0–34.0)
MCHC: 29.9 g/dL — ABNORMAL LOW (ref 30.0–36.0)
MCV: 79 fL — ABNORMAL LOW (ref 80.0–100.0)
Platelets: 35 10*3/uL — ABNORMAL LOW (ref 150–400)
RBC: 3.43 MIL/uL — AB (ref 4.22–5.81)
RDW: 30.4 % — ABNORMAL HIGH (ref 11.5–15.5)
WBC: 9.9 10*3/uL (ref 4.0–10.5)
nRBC: 160.7 % — ABNORMAL HIGH (ref 0.0–0.2)

## 2018-08-19 LAB — RENAL FUNCTION PANEL
Albumin: 2.1 g/dL — ABNORMAL LOW (ref 3.5–5.0)
Anion gap: 11 (ref 5–15)
BUN: 32 mg/dL — ABNORMAL HIGH (ref 8–23)
CHLORIDE: 96 mmol/L — AB (ref 98–111)
CO2: 23 mmol/L (ref 22–32)
Calcium: 7.3 mg/dL — ABNORMAL LOW (ref 8.9–10.3)
Creatinine, Ser: 3.04 mg/dL — ABNORMAL HIGH (ref 0.61–1.24)
GFR calc Af Amer: 24 mL/min — ABNORMAL LOW (ref >60)
GFR, EST NON AFRICAN AMERICAN: 21 mL/min — AB (ref >60)
Glucose, Bld: 124 mg/dL — ABNORMAL HIGH (ref 70–99)
Phosphorus: 3.7 mg/dL (ref 2.5–4.6)
Potassium: 4.3 mmol/L (ref 3.5–5.1)
Sodium: 130 mmol/L — ABNORMAL LOW (ref 135–145)

## 2018-08-19 LAB — POCT ACTIVATED CLOTTING TIME
Activated Clotting Time: 208 seconds
Activated Clotting Time: 202 seconds
Activated Clotting Time: 208 seconds
Activated Clotting Time: 224 seconds
Activated Clotting Time: 202 seconds
Activated Clotting Time: 208 seconds
Activated Clotting Time: 202 seconds
Activated Clotting Time: 224 seconds
Activated Clotting Time: 252 seconds
Activated Clotting Time: 213 seconds

## 2018-08-19 LAB — COOXEMETRY PANEL
Carboxyhemoglobin: 1.2 % (ref 0.5–1.5)
Methemoglobin: 1.9 % — ABNORMAL HIGH (ref 0.0–1.5)
O2 Saturation: 23.7 %
TOTAL HEMOGLOBIN: 8.3 g/dL — AB (ref 12.0–16.0)

## 2018-08-19 LAB — MAGNESIUM: Magnesium: 2.5 mg/dL — ABNORMAL HIGH (ref 1.7–2.4)

## 2018-08-19 NOTE — Progress Notes (Signed)
I met with patient and his family.  In attendance were all four sons, sister, nieces and multiple cousins.  I updated family on patient's current medical status.  Patient confirmed having talked with both cardiology and nephrology and being told that he was unlikely to experience any clinical improvement.  Patient says that comfort care was recommended.  We talked at length about what comfort care would entail including stopping current medical treatment and transitioning to supportive care with aggressive symptom management.  Patient says he recognizes that he is likely approaching end-of-life.  One of patient's sons became quite angry during this conversation and said that he was not willing to hear or discuss anything about comfort care. He proceeded to call me a profane word and then left the room. Two of his brothers followed him out of the room. With the remaining family, we continued to talk about patient's poor prognosis and the option of transitioning to less aggressive measures. Patient's significant other seemed shocked to learn that patient might die even if aggressive care continued.   All questions answered to the best of my ability. Family say they were continuing to pray for a miracle and patient says he wants to continue current scope of treatment for now.   Plan: Continue current scope of treatment Will continue to follow and speak with patient/family about goals

## 2018-08-19 NOTE — Progress Notes (Signed)
[facility]    NEPHROLOGY PROGRESS NOTE  SUBJECTIVE:   Remains on CRRT, vasopressor/inotrope support.  Increasing O2 need.   This morning he generally feels worse.  Inquiring about what exactly would be given if switch to comfort care.  Discussed meds.     OBJECTIVE:  Vitals:   08/19/18 0742 08/19/18 0745  BP:    Pulse:  88  Resp:  (!) 24  Temp:    SpO2: (!) 85% (!) 88%    Intake/Output Summary (Last 24 hours) at 08/19/2018 6578 Last data filed at 08/19/2018 0800 Gross per 24 hour  Intake 1603.11 ml  Output 1805 ml  Net -201.89 ml      Genearl:  sleepy, chronically ill HEENT: MMM [place] AT anicteric sclera CV:  Heart RRR (+) systolic murmur Lungs:  Sl inc wob with conversation Abd:  abd SNT/ND with normal BS Extremities:  No LE edema. Skin:  Cyanosis digits  MEDICATIONS:  . sodium chloride   Intravenous Once  . amiodarone  200 mg Oral BID  . aspirin EC  81 mg Oral Daily  . atorvastatin  80 mg Oral q1800  . chlorhexidine  15 mL Mouth Rinse BID  . Chlorhexidine Gluconate Cloth  6 each Topical Daily  . darbepoetin (ARANESP) injection - DIALYSIS  200 mcg Intravenous Q Thu-HD  . feeding supplement (ENSURE ENLIVE)  237 mL Oral BID BM  . feeding supplement (PRO-STAT SUGAR FREE 64)  30 mL Oral BID  . mouth rinse  15 mL Mouth Rinse BID  . mexiletine  150 mg Oral Q8H  . midodrine  15 mg Oral TID WC  . pantoprazole  40 mg Oral BID  . sodium chloride flush  3 mL Intravenous Q12H  . sodium chloride flush  3 mL Intravenous Q12H       LABS:   CBC Latest Ref Rng & Units 08/19/2018 08/18/2018 08/17/2018  WBC 4.0 - 10.5 K/uL 9.9 9.3 8.8  Hemoglobin 13.0 - 17.0 g/dL 8.1(L) 8.9(L) 8.7(L)  Hematocrit 39.0 - 52.0 % 27.1(L) 29.1(L) 29.1(L)  Platelets 150 - 400 K/uL 35(L) 45(L) 59(L)    CMP Latest Ref Rng & Units 08/19/2018 08/18/2018 08/17/2018  Glucose 70 - 99 mg/dL 124(H) 119(H) 128(H)  BUN 8 - 23 mg/dL 32(H) 48(H) 43(H)  Creatinine 0.61 - 1.24 mg/dL 3.04(H)  4.56(H) 4.17(H)  Sodium 135 - 145 mmol/L 130(L) 130(L) 129(L)  Potassium 3.5 - 5.1 mmol/L 4.3 5.7(H) 5.3(H)  Chloride 98 - 111 mmol/L 96(L) 95(L) 95(L)  CO2 22 - 32 mmol/L 23 20(L) 22  Calcium 8.9 - 10.3 mg/dL 7.3(L) 7.6(L) 7.7(L)  Total Protein 6.5 - 8.1 g/dL - - -  Total Bilirubin 0.3 - 1.2 mg/dL - - -  Alkaline Phos 38 - 126 U/L - - -  AST 15 - 41 U/L - - -  ALT 0 - 44 U/L - - -    Lab Results  Component Value Date   CALCIUM 7.3 (L) 08/19/2018   CAION 0.95 (L) 08/21/2018   PHOS 3.7 08/19/2018       Component Value Date/Time   COLORURINE YELLOW 07/26/2018 2208   APPEARANCEUR HAZY (A) 07/27/2018 2208   LABSPEC 1.013 07/11/2018 2208   PHURINE 5.0 08/03/2018 [place] 07/16/2018 [place] 07/21/2018 [place] 07/16/2018 [place] 07/06/2018 2208   PROTEINUR NEGATIVE 07/07/2018 2208   NITRITE NEGATIVE 07/20/2018 2208   LEUKOCYTESUR NEGATIVE 07/05/2018 2208  Component Value Date/Time   PHART 7.385 08/18/2018 1430   PCO2ART 34.7 08/10/2018 1430   PO2ART 75.0 (L) 08/06/2018 1430   HCO3 22.9 08/11/2018 1442   TCO2 24 08/09/2018 1442   ACIDBASEDEF 3.0 (H) 08/19/2018 1442   O2SAT 23.7 08/19/2018 0330       Component Value Date/Time   IRON 14 (L) 07/25/2018 1651   TIBC 458 (H) 07/23/2018 1651   FERRITIN 28 08/01/2018 1651   IRONPCTSAT 3 (L) 07/14/2018 1651       ASSESSMENT/PLAN:      1. Acute systolic heart failure ejection fraction 15% severe RV dysfunction with cardiogenic shock. Terminal condition.  Comfort care being recommended.  He is now limited resuscitation.   2. Coronary artery disease continues aspirin and statin.  LHC showed no interventions possible.   3. ESRD can continue CRRT as a life sustaining therapy until comfort measures pursued.  K controlled now.    I encouraged him to gather his family this morning.  RN is going to call them.  Let me know if I can help in any way.    Jannifer Hick MD [facility]

## 2018-08-19 NOTE — Plan of Care (Signed)
  Problem: Coping: Goal: Ability to identify and develop effective coping behavior will improve Outcome: Progressing Note:  Patient with very large support system. Excellent family support.    Problem: Clinical Measurements: Goal: Ability to maintain clinical measurements within normal limits will improve 08/19/2018 1823 by Boris Lown, RN Outcome: Not Progressing Note:  Patient requiring increasing pressor support.  08/19/2018 1821 by Boris Lown, RN Outcome: Not Progressing Goal: Respiratory complications will improve 08/19/2018 1823 by Boris Lown, RN Outcome: Not Progressing Note:  Patient requiring O2 support. 08/19/2018 1821 by Boris Lown, RN Outcome: Not Progressing   Problem: Nutrition: Goal: Adequate nutrition will be maintained 08/19/2018 1823 by Boris Lown, RN Outcome: Not Progressing Note:  Patient with very poor PO intake. Only taking intermittent sips of liquids, not eating anything solid. 08/19/2018 1821 by Boris Lown, RN Outcome: Not Progressing   Problem: Fluid Volume: Goal: Fluid volume balance will be maintained or improved 08/19/2018 1823 by Boris Lown, RN Outcome: Not Progressing Note:  Not able to remove fluid on CRRT due to low BP's.  08/19/2018 1821 by Boris Lown, RN Outcome: Not Progressing   Problem: Role Relationship: Goal: Family's ability to cope with current situation will improve Outcome: Not Progressing Note:  Patient's sons are having a hard time coping, but patient believes they will support him in his decisions when he decides to transition to comfort care. Patient states he's not yet ready, but believes he will be soon.

## 2018-08-19 NOTE — Progress Notes (Signed)
[place]  [phone] [phone]   Name: Samuel Bates Date: 08/19/2018 MRN: [id number]  DOB: [birth year]  Patient Care Team: Saintclair Halsted, FNP as PCP - General (Family Medicine)    REASON FOR CONSULTATION: 63 y.o.malewith past medical history of hypertension and hyperlipidemiawho was admitted on1/25/2020with an MI that had most likely being going on for several days. Work up revealed acute renal failure, an EF of 10-15% and cardiogenic shock. Patient remains on CRRT and multiple pressors, without any clinical improvement over multiple days. His condition is felt to be terminal and palliative care was consulted to help address goals.    CODE STATUS: Limited code  PAST MEDICAL HISTORY: Past Medical History:  Diagnosis Date  . Allergy   . Hx of adenomatous colonic polyps 03/07/2017  . Hyperlipidemia   . Hypertension     PAST SURGICAL HISTORY:  Past Surgical History:  Procedure Laterality Date  . COLONOSCOPY    . DIALYSIS/PERMA CATHETER INSERTION N/A 07/26/2018   Procedure: DIALYSIS/PERMA CATHETER INSERTION;  Surgeon: Jolaine Artist, MD;  Location: [facility];  Service: Cardiovascular;  Laterality: N/A;  . IR FLUORO GUIDE CV LINE RIGHT  08/07/2018  . IR US GUIDE VASC ACCESS RIGHT  08/07/2018  . POLYPECTOMY    . RIGHT HEART CATH N/A 07/14/2018   Procedure: RIGHT HEART CATH;  Surgeon: Jolaine Artist, MD;  Location: [facility];  Service: Cardiovascular;  Laterality: N/A;  . RIGHT/LEFT HEART CATH AND CORONARY ANGIOGRAPHY N/A 08/22/2018   Procedure: RIGHT/LEFT HEART CATH AND CORONARY ANGIOGRAPHY;  Surgeon: Jolaine Artist, MD;  Location: [facility];  Service: Cardiovascular;  Laterality: N/A;  . TONSILLECTOMY     age 59    HEMATOLOGY/ONCOLOGY HISTORY:   No history exists.    ALLERGIES:  is allergic to penicillins and simvastatin.  MEDICATIONS:  Current  Facility-Administered Medications  Medication Dose Route Frequency Provider Last Rate Last Dose  . 0.9 %  sodium chloride infusion (Manually program via Guardrails IV Fluids)   Intravenous Once Bensimhon, Shaune Pascal, MD      . 0.9 %  sodium chloride infusion   Intra-arterial PRN Bensimhon, Shaune Pascal, MD      . 0.9 %  sodium chloride infusion   Intravenous PRN Bensimhon, Shaune Pascal, MD   Stopped at 08/14/18 [phone]  . 0.9 %  sodium chloride infusion  100 mL Intravenous PRN Bensimhon, Shaune Pascal, MD      . 0.9 %  sodium chloride infusion  100 mL Intravenous PRN Bensimhon, Shaune Pascal, MD      . 0.9 %  sodium chloride infusion  250 mL Intravenous PRN Bensimhon, Shaune Pascal, MD      . acetaminophen (TYLENOL) tablet 650 mg  650 mg Oral Q4H PRN Bensimhon, Shaune Pascal, MD      . amiodarone (PACERONE) tablet 200 mg  200 mg Oral BID Bensimhon, Shaune Pascal, MD   200 mg at 08/18/18 2119  . atorvastatin (LIPITOR) tablet 80 mg  80 mg Oral q1800 Bensimhon, Shaune Pascal, MD   80 mg at 08/17/18 1707  . chlorhexidine (PERIDEX) 0.12 % solution 15 mL  15 mL Mouth Rinse BID Bensimhon, Shaune Pascal, MD   15 mL at 08/18/18 2119  . Chlorhexidine Gluconate Cloth 2 % PADS 6 each  6 each Topical Daily Bensimhon, Shaune Pascal, MD   6 each at 08/18/18 1700  . Darbepoetin Alfa (ARANESP) injection 200 mcg  200  mcg Intravenous Q Thu-HD Bensimhon, Shaune Pascal, MD   Stopped at 08/10/2018 1613  . DOBUTamine (DOBUTREX) infusion 4000 mcg/mL  2.5 mcg/kg/min Intravenous Titrated Bensimhon, Shaune Pascal, MD 2.54 mL/hr at 08/19/18 1000 2.5 mcg/kg/min at 08/19/18 1000  . feeding supplement (ENSURE ENLIVE) (ENSURE ENLIVE) liquid 237 mL  237 mL Oral BID BM Bensimhon, Shaune Pascal, MD   237 mL at 08/14/18 1238  . feeding supplement (PRO-STAT SUGAR FREE 64) liquid 30 mL  30 mL Oral BID Bensimhon, Shaune Pascal, MD   30 mL at 08/18/18 2118  . heparin 10,000 units/ 20 mL infusion syringe  250-3,000 Units/hr CRRT Continuous Lelon Perla, MD 3.5 mL/hr at 08/19/18 1004 1,750 Units/hr at  08/19/18 1004  . heparin injection 1,000-6,000 Units  1,000-6,000 Units CRRT PRN Bensimhon, Shaune Pascal, MD   3,800 Units at 08/18/18 1420  . loperamide HCl (IMODIUM) 1 MG/7.5ML suspension 2 mg  2 mg Oral PRN Bensimhon, Shaune Pascal, MD   4 mg at 08/14/18 0086  . magic mouthwash  10 mL Oral TID PRN Bensimhon, Shaune Pascal, MD   10 mL at 08/05/2018 0207  . MEDLINE mouth rinse  15 mL Mouth Rinse BID Bensimhon, Shaune Pascal, MD   15 mL at 08/12/2018 0847  . mexiletine (MEXITIL) capsule 150 mg  150 mg Oral Q8H Bensimhon, Shaune Pascal, MD   150 mg at 08/18/18 2119  . midodrine (PROAMATINE) tablet 15 mg  15 mg Oral TID WC Bensimhon, Shaune Pascal, MD   15 mg at 08/18/18 1742  . nitroGLYCERIN (NITROSTAT) SL tablet 0.4 mg  0.4 mg Sublingual Q5 Min x 3 PRN Bensimhon, Shaune Pascal, MD      . norepinephrine (LEVOPHED) 16 mg in 250m premix infusion  0-80 mcg/min Intravenous Titrated Bensimhon, DShaune Pascal MD 56.3 mL/hr at 08/19/18 1000 60 mcg/min at 08/19/18 1000  . ondansetron (ZOFRAN) injection 4 mg  4 mg Intravenous Q6H PRN Bensimhon, DShaune Pascal MD   4 mg at 08/18/18 1305  . pantoprazole (PROTONIX) EC tablet 40 mg  40 mg Oral BID Bensimhon, DShaune Pascal MD   40 mg at 08/18/18 2119  . prismasol BGK 2/2.5 dialysis solution   CRRT Continuous KJustin Mend MD 1,500 mL/hr at 08/19/18 0725    . prismasol BGK 2/2.5 replacement solution   CRRT Continuous KJustin Mend MD 200 mL/hr at 08/18/18 1106    . prismasol BGK 2/2.5 replacement solution   CRRT Continuous KJustin Mend MD 200 mL/hr at 08/18/18 1106    . promethazine (PHENERGAN) suppository 12.5 mg  12.5 mg Rectal Q6H PRN Dellinger, MBobby Rumpf PA-C      . promethazine (PHENERGAN) tablet 12.5 mg  12.5 mg Oral Q6H PRN Dellinger, Marianne L, PA-C   12.5 mg at 08/17/18 1748  . sodium chloride 0.9 % primer fluid for CRRT   CRRT PRN Bensimhon, DShaune Pascal MD      . sodium chloride flush (NS) 0.9 % injection 3 mL  3 mL Intravenous Q12H Bensimhon, DShaune Pascal MD   3 mL at 08/14/18 2111  .  sodium chloride flush (NS) 0.9 % injection 3 mL  3 mL Intravenous PRN Bensimhon, DShaune Pascal MD      . sodium chloride flush (NS) 0.9 % injection 3 mL  3 mL Intravenous Q12H Bensimhon, DShaune Pascal MD   3 mL at 08/18/18 2125  . sodium chloride flush (NS) 0.9 % injection 3 mL  3 mL Intravenous PRN Bensimhon, DShaune Pascal MD  VITAL SIGNS: BP (!) 83/64   Pulse 86   Temp (!) 97.3 F (36.3 C) (Oral)   Resp (!) 26   Ht '6\' 3"'  (1.905 m)   Wt 72 kg   SpO2 97%   BMI 19.84 kg/m  Filed Weights   08/17/18 0600 08/18/18 0600 08/19/18 0500  Weight: 67.8 kg 69.3 kg 72 kg    Estimated body mass index is 19.84 kg/m as calculated from the following:   Height as of this encounter: '6\' 3"'  (1.905 m).   Weight as of this encounter: 72 kg.  LABS: CBC:    Component Value Date/Time   WBC 9.9 08/19/2018 0315   HGB 8.1 (L) 08/19/2018 0315   HCT 27.1 (L) 08/19/2018 0315   PLT 35 (L) 08/19/2018 0315   MCV 79.0 (L) 08/19/2018 0315   NEUTROABS 78.6 (H) 07/24/2018 1651   LYMPHSABS 8.7 (H) 07/31/2018 1651   MONOABS 11.5 (H) 07/21/2018 1651   EOSABS 0.0 07/30/2018 1651   BASOSABS 0.1 07/16/2018 1651   Comprehensive Metabolic Panel:    Component Value Date/Time   NA 130 (L) 08/19/2018 0315   K 4.3 08/19/2018 0315   CL 96 (L) 08/19/2018 0315   CO2 23 08/19/2018 0315   BUN 32 (H) 08/19/2018 0315   CREATININE 3.04 (H) 08/19/2018 0315   GLUCOSE 124 (H) 08/19/2018 0315   CALCIUM 7.3 (L) 08/19/2018 0315   AST 74 (H) 08/07/2018 0422   ALT 185 (H) 08/07/2018 0422   ALKPHOS 78 08/07/2018 0422   BILITOT 2.0 (H) 08/07/2018 0422   PROT 5.8 (L) 08/07/2018 0422   ALBUMIN 2.1 (L) 08/19/2018 0315    RADIOGRAPHIC STUDIES: Dg Chest 2 View  Result Date: 07/31/2018 CLINICAL DATA:  Shortness of breath for the past week with intermittent chest pain. EXAM: CHEST - 2 VIEW COMPARISON:  05/10/2006. FINDINGS: Stable enlarged cardiac silhouette. Interval band like area of patchy opacity in the right mid lung zone. Clear  left lung. Stable mild diffuse prominence of the interstitial markings with normal vascularity. Decreased peribronchial thickening. Mild thoracolumbar spine degenerative changes. IMPRESSION: 1. Interval band like area of patchy opacity in the right mid lung zone. This could represent pneumonia, patchy atelectasis or small area of pulmonary infarction. 2. Stable cardiomegaly and mild chronic interstitial lung disease. Electronically Signed   By: Claudie Revering M.D.   On: 07/05/2018 14:36   US Renal  Result Date: 07/29/2018 CLINICAL DATA:  Acute kidney injury EXAM: RENAL / URINARY TRACT ULTRASOUND COMPLETE COMPARISON:  None. FINDINGS: Right Kidney: Renal measurements: 11.1 cm. Echogenicity mildly increased. No mass or hydronephrosis visualized. Upper pole cyst measures 1.6 x 1.3 x 1.5 cm. Left Kidney: Not visualized due to inability of patient to be positioned adequately. Bladder: Decompressed by Foley catheter. IMPRESSION: 1. Mildly echogenic right kidney, compatible with chronic medical renal disease. 2. Nonvisualization of the left kidney. Electronically Signed   By: Ulyses Jarred M.D.   On: 07/29/2018 03:55   Ir Fluoro Guide Cv Line Right  Result Date: 08/07/2018 INDICATION: 63 year old male with a history of renal failure EXAM: TUNNELED PICC LINE WITH ULTRASOUND AND FLUOROSCOPIC GUIDANCE MEDICATIONS: Vancomycin 1 gm IV. The antibiotic was given in an appropriate time interval prior to skin puncture. ANESTHESIA/SEDATION: Versed 1.5 mg IV; Fentanyl 75 mcg IV; Moderate Sedation Time:  17 minutes The patient was continuously monitored during the procedure by the interventional radiology nurse under my direct supervision. FLUOROSCOPY TIME:  Fluoroscopy Time: 0 minutes 36 seconds (3 mGy). COMPLICATIONS: None PROCEDURE: The  procedure, risks, benefits, and alternatives were explained to the patient. Questions regarding the procedure were encouraged and answered. The patient understands and consents to the procedure.  The right neck and chest was prepped with chlorhexidine, and draped in the usual sterile fashion using maximum barrier technique (cap and mask, sterile gown, sterile gloves, large sterile sheet, hand hygiene and cutaneous antiseptic). Antibiotic prophylaxis was initiated with 1.0g vancomycin administered IV within 1 hour prior to skin incision. Local anesthesia was attained by infiltration with 1% lidocaine without epinephrine. Ultrasound demonstrated patency of the right internal jugular vein, and this was documented with an image. Under real-time ultrasound guidance, this vein was accessed with a 21 gauge micropuncture needle and image documentation was performed. A small dermatotomy was made at the access site with an 11 scalpel. A 0.018" wire was advanced into the SVC and the access needle exchanged for a 38F micropuncture vascular sheath. The 0.018" wire was then removed and a 0.035" wire advanced into the IVC. Upon withdrawal of the 018 wire, the wire was marked for appropriate length of the internal portion of the catheter. A 23 cm tip to cuff split tip catheter was selected. The skin and subcutaneous tissues of the right chest wall were then generously infiltrated with 1% lidocaine without epinephrine from the chest wall to the puncture site at the right IJ. The hemodialysis catheter was then back tunneled from the right chest wall incision to the dermatotomy at the right neck. The venous access site was then serially dilated and a peel away vascular sheath placed over the wire. The wire was removed and the catheter was placed through the peel-away sheath. The catheter tip is positioned in the upper right atrium. This was documented with a spot image. Both ports of the hemodialysis catheter were then tested for excellent function. The ports were then locked with heparinized lock. The incision at the neck was then sealed with Dermabond. Patient tolerated the procedure well and remained hemodynamically stable  throughout. No complications were encountered and no significant blood loss was encountered. FINDINGS: After catheter placement, the tip lies within the superior cavoatrial junction. The catheter aspirates and flushes normally and is ready for immediate use. IMPRESSION: Status post right IJ tunneled hemodialysis catheter measuring 23 cm tip to cuff. Catheter ready for use. Signed, Dulcy Fanny. Dellia Nims, RPVI [facility] Electronically Signed   By: Corrie Mckusick D.O.   On: 08/07/2018 14:03   Ir US Guide Vasc Access Right  Result Date: 08/07/2018 INDICATION: 63 year old male with a history of renal failure EXAM: TUNNELED PICC LINE WITH ULTRASOUND AND FLUOROSCOPIC GUIDANCE MEDICATIONS: Vancomycin 1 gm IV. The antibiotic was given in an appropriate time interval prior to skin puncture. ANESTHESIA/SEDATION: Versed 1.5 mg IV; Fentanyl 75 mcg IV; Moderate Sedation Time:  17 minutes The patient was continuously monitored during the procedure by the interventional radiology nurse under my direct supervision. FLUOROSCOPY TIME:  Fluoroscopy Time: 0 minutes 36 seconds (3 mGy). COMPLICATIONS: None PROCEDURE: The procedure, risks, benefits, and alternatives were explained to the patient. Questions regarding the procedure were encouraged and answered. The patient understands and consents to the procedure. The right neck and chest was prepped with chlorhexidine, and draped in the usual sterile fashion using maximum barrier technique (cap and mask, sterile gown, sterile gloves, large sterile sheet, hand hygiene and cutaneous antiseptic). Antibiotic prophylaxis was initiated with 1.0g vancomycin administered IV within 1 hour prior to skin incision. Local anesthesia was attained by infiltration with  1% lidocaine without epinephrine. Ultrasound demonstrated patency of the right internal jugular vein, and this was documented with an image. Under real-time ultrasound guidance,  this vein was accessed with a 21 gauge micropuncture needle and image documentation was performed. A small dermatotomy was made at the access site with an 11 scalpel. A 0.018" wire was advanced into the SVC and the access needle exchanged for a 8F micropuncture vascular sheath. The 0.018" wire was then removed and a 0.035" wire advanced into the IVC. Upon withdrawal of the 018 wire, the wire was marked for appropriate length of the internal portion of the catheter. A 23 cm tip to cuff split tip catheter was selected. The skin and subcutaneous tissues of the right chest wall were then generously infiltrated with 1% lidocaine without epinephrine from the chest wall to the puncture site at the right IJ. The hemodialysis catheter was then back tunneled from the right chest wall incision to the dermatotomy at the right neck. The venous access site was then serially dilated and a peel away vascular sheath placed over the wire. The wire was removed and the catheter was placed through the peel-away sheath. The catheter tip is positioned in the upper right atrium. This was documented with a spot image. Both ports of the hemodialysis catheter were then tested for excellent function. The ports were then locked with heparinized lock. The incision at the neck was then sealed with Dermabond. Patient tolerated the procedure well and remained hemodynamically stable throughout. No complications were encountered and no significant blood loss was encountered. FINDINGS: After catheter placement, the tip lies within the superior cavoatrial junction. The catheter aspirates and flushes normally and is ready for immediate use. IMPRESSION: Status post right IJ tunneled hemodialysis catheter measuring 23 cm tip to cuff. Catheter ready for use. Signed, Dulcy Fanny. Dellia Nims, RPVI [facility] Electronically Signed   By: Corrie Mckusick D.O.   On: 08/07/2018 14:03   Dg Chest Port 1  View  Result Date: 08/10/2018 CLINICAL DATA:  Increased oxygen requirement, shortness of breath EXAM: PORTABLE CHEST 1 VIEW COMPARISON:  08/09/2018 FINDINGS: No significant change in AP portable examination with diffuse interstitial pulmonary opacity, likely edema, gross cardiomegaly, and vascular catheters. A left neck vascular catheter remains with tip directed likely into the azygos vein. Unchanged gross cardiomegaly and/or pericardial effusion. IMPRESSION: No significant change in AP portable examination with diffuse interstitial pulmonary opacity, likely edema, gross cardiomegaly, and vascular catheters. A left neck vascular catheter remains with tip directed likely into the azygos vein. Unchanged gross cardiomegaly and/or pericardial effusion. Electronically Signed   By: Eddie Candle M.D.   On: 08/10/2018 09:13   Dg Chest Port 1 View  Result Date: 08/09/2018 CLINICAL DATA:  Hypoxia, history of hypertension, dialysis. EXAM: PORTABLE CHEST 1 VIEW COMPARISON:  Chest x-rays dated 08/03/2018 and 07/27/2018. FINDINGS: Stable cardiomegaly. RIGHT-sided dialysis catheter in place with tip adequately positioned at the level of the lower SVC. LEFT IJ central line has been repositioned with tip now at the level of the upper SVC. Increased central pulmonary vascular congestion and bilateral interstitial edema. No pleural effusion or pneumothorax seen. Osseous structures about the chest are unremarkable. L[place]er IMPRESSION: 1. Cardiomegaly with central pulmonary vascular congestion and bilateral interstitial edema indicating CHF/volume overload. 2. LEFT IJ central line has been repositioned with tip now at the level of the upper SVC. Electronically Signed   By: Franki Cabot M.D.   On: 08/09/2018 10:40   Dg  Chest Port 1 View  Result Date: 08/03/2018 CLINICAL DATA:  Hypoxia. EXAM: PORTABLE CHEST 1 VIEW COMPARISON:  07/29/2018. FINDINGS: Interim placement right IJ line, its tip is over the superior vena cava. Left IJ  line noted with tip over the right innominate vein. Mediastinum unremarkable. Stable cardiomegaly. No focal infiltrate. No pleural effusion or pneumothorax. Left costophrenic angle not completely imaged. IMPRESSION: 1. Interim placement right IJ line, its tip is over the superior vena cava. Left IJ line noted, tip is now over the right innominate vein. 2. Severe cardiomegaly. No pulmonary venous congestion. No focal infiltrate. Electronically Signed   By: Marcello Moores  Register   On: 08/03/2018 11:09   Dg Chest Port 1 View  Result Date: 07/29/2018 CLINICAL DATA:  Encounter for central line placement. EXAM: PORTABLE CHEST 1 VIEW COMPARISON:  07/29/2018 FINDINGS: The there is been interval placement of a left IJ catheter with tip projecting over the SVC. No pneumothorax identified. Cardiac enlargement is stable. No pleural effusion or edema identified. No airspace opacities. IMPRESSION: 1. No complications after left IJ catheter placement. The catheter tip projects over the SVC. 2. Cardiac enlargement. Electronically Signed   By: Kerby Moors M.D.   On: 07/29/2018 13:27   Dg Chest Port 1 View  Result Date: 07/29/2018 CLINICAL DATA:  Respiratory failure EXAM: PORTABLE CHEST 1 VIEW COMPARISON:  Yesterday FINDINGS: Cardiomegaly. Mild atelectasis or scarring along the right minor fissure. Interstitial coarsening without Kerley lines or effusion. No pneumothorax. Aortic tortuosity. IMPRESSION: Cardiomegaly and vascular congestion. Electronically Signed   By: Monte Fantasia M.D.   On: 07/29/2018 06:53    PERFORMANCE STATUS (ECOG) : 4 - Bedbound  Review of Systems As noted above. Otherwise, a complete review of systems is negative.  Physical Exam General: NAD, frail appearing, thin Cardiovascular: regular rate and rhythm Pulmonary: unlabored Skin: no rashes Neurological: Weakness but otherwise nonfocal  IMPRESSION: Patient remains critically ill on CRRT and high-dose pressors/inotropes. He has had no  significant clinical improvement and his condition is perceived to be terminal by cardiology/nephrology. Comfort care is being discussed.   I met with patient and his significant other. Patient requested that I return later in the afternoon when his sons are here. Will try to meet with family today to clarify goals.   PLAN: Family meeting   Time Total: 30 minutes  Visit consisted of counseling and education dealing with the complex and emotionally intense issues of symptom management and palliative care in the setting of serious and potentially life-threatening illness.Greater than 50%  of this time was spent counseling and coordinating care related to the above assessment and plan.  Signed by: Altha Harm, PhD, NP-C [phone] (Work Cell)

## 2018-08-19 NOTE — Progress Notes (Signed)
Patient ID: Samuel Bates, male   DOB: May 12, 1956, 63 y.o.   MRN: [id number]     Advanced Heart Failure Rounding Note  PCP-Cardiologist: No primary care provider on file.   Subjective:    Cath 2/13 with occluded RCA and LAD low output (co-ox 34%) and elevated filling pressures  Remains on NE 60 and dobutamine 2.5. Co-ox lower at 24%.  CVVH ongoing but having to run even due to hypotension.  CVP 16-18.  Weight up 6 pounds.   PLTs 59-> 45k -> 35k.   Feels fatigued and weak.   Objective:   Weight Range: 72 kg Body mass index is 19.84 kg/m.   Vital Signs:   Temp:  [97.3 F (36.3 C)-99.1 F (37.3 C)] 97.3 F (36.3 C) (02/16 0808) Pulse Rate:  [79-116] 88 (02/16 0745) Resp:  [12-29] 23 (02/16 0930) BP: (71-92)/(44-69) 88/62 (02/16 0930) SpO2:  [85 %-100 %] 99 % (02/16 0930) Weight:  [72 kg] 72 kg (02/16 0500) Last BM Date: 08/18/18  Weight change: Filed Weights   08/17/18 0600 08/18/18 0600 08/19/18 0500  Weight: 67.8 kg 69.3 kg 72 kg   Intake/Output:   Intake/Output Summary (Last 24 hours) at 08/19/2018 0952 Last data filed at 08/19/2018 0900 Gross per 24 hour  Intake 1543.11 ml  Output 1814 ml  Net -270.89 ml    Physical Exam   General: Frail Neck: JVP 14-16, no thyromegaly or thyroid nodule.  Lungs: Dependent crackles CV: Lateral PMI.  Heart regular S1/S2, +S3, no murmur.  No peripheral edema.   Abdomen: Soft, nontender, no hepatosplenomegaly, no distention.  Skin: Intact without lesions or rashes.  Neurologic: Alert and oriented x 3.  Psych: Normal affect. Extremities: No clubbing or cyanosis.  HEENT: Normal.    Telemetry   NSR 80s personally reviewed.  Labs    CBC Recent Labs    08/18/18 0325 08/19/18 0315  WBC 9.3 9.9  HGB 8.9* 8.1*  HCT 29.1* 27.1*  MCV 77.0* 79.0*  PLT 45* 35*   Basic Metabolic Panel Recent Labs    08/18/18 0325 08/19/18 0315  NA 130* 130*  K 5.7* 4.3  CL 95* 96*  CO2 20* 23  GLUCOSE 119* 124*  BUN 48* 32*    CREATININE 4.56* 3.04*  CALCIUM 7.6* 7.3*  MG 2.7* 2.5*  PHOS 4.7* 3.7   Liver Function Tests Recent Labs    08/18/18 0325 08/19/18 0315  ALBUMIN 2.3* 2.1*   No results for input(s): LIPASE, AMYLASE in the last 72 hours. Cardiac Enzymes No results for input(s): CKTOTAL, CKMB, CKMBINDEX, TROPONINI in the last 72 hours.  BNP: BNP (last 3 results) Recent Labs    07/29/2018 1651  BNP 4,188.4*    ProBNP (last 3 results) No results for input(s): PROBNP in the last 8760 hours.   D-Dimer No results for input(s): DDIMER in the last 72 hours. Hemoglobin A1C No results for input(s): HGBA1C in the last 72 hours. Fasting Lipid Panel No results for input(s): CHOL, HDL, LDLCALC, TRIG, CHOLHDL, LDLDIRECT in the last 72 hours. Thyroid Function Tests No results for input(s): TSH, T4TOTAL, T3FREE, THYROIDAB in the last 72 hours.  Invalid input(s): FREET3  Other results:   Imaging    No results found.   Medications:     Scheduled Medications: . sodium chloride   Intravenous Once  . amiodarone  200 mg Oral BID  . aspirin EC  81 mg Oral Daily  . atorvastatin  80 mg Oral q1800  . chlorhexidine  15 mL  Mouth Rinse BID  . Chlorhexidine Gluconate Cloth  6 each Topical Daily  . darbepoetin (ARANESP) injection - DIALYSIS  200 mcg Intravenous Q Thu-HD  . feeding supplement (ENSURE ENLIVE)  237 mL Oral BID BM  . feeding supplement (PRO-STAT SUGAR FREE 64)  30 mL Oral BID  . mouth rinse  15 mL Mouth Rinse BID  . mexiletine  150 mg Oral Q8H  . midodrine  15 mg Oral TID WC  . pantoprazole  40 mg Oral BID  . sodium chloride flush  3 mL Intravenous Q12H  . sodium chloride flush  3 mL Intravenous Q12H    Infusions: . sodium chloride    . sodium chloride Stopped (08/14/18 0727)  . sodium chloride    . sodium chloride    . sodium chloride    . DOBUTamine 2.5 mcg/kg/min (08/19/18 0800)  . heparin 10,000 units/ 20 mL infusion syringe 1,750 Units/hr (08/19/18 0408)  .  norepinephrine (LEVOPHED) Adult infusion 60 mcg/min (08/19/18 0934)  . prismasol BGK 2/2.5 dialysis solution 1,500 mL/hr at 08/19/18 0725  . prismasol BGK 2/2.5 replacement solution 200 mL/hr at 08/18/18 1106  . prismasol BGK 2/2.5 replacement solution 200 mL/hr at 08/18/18 1106    PRN Medications: Place/Maintain arterial line **AND** sodium chloride, sodium chloride, sodium chloride, sodium chloride, sodium chloride, acetaminophen, heparin, loperamide HCl, magic mouthwash, nitroGLYCERIN, ondansetron (ZOFRAN) IV, promethazine, promethazine, sodium chloride, sodium chloride flush, sodium chloride flush   Patient Profile   62 y.o.malewith past medical history of hypertension, hyperlipidemia admitted with recent but late presenting inferior lateral myocardial infarction, cardiogenic shock, acute renal failure and microcytic anemia. Echocardiogram showed severe LV and RV dysfunction and moderate mitral regurgitation.  Assessment/Plan   1. Acute systolic HF with cardiogenic shock due to OOH MI: Ischemic cardiomyopathy, echo 1/5 EF 15% severe RV dysfunction and moderate MR.   - Now on norepi 60 mcg + dobutamine 2.5 mcg + midodrine 15 mg tid.  - Co-ox markedly low at 24% c/w cardiogenic shock despite high-dose NE and dobutamine.  - CVP 16-18, unable to pull fluid via CVVH due to hypotension.  - Cath 2/13 with occluded LAD and RCA - Dr. Haroldine Laws has discussed case with our interventional team, [facility] and the [facility]. In light of his ESRD and recent tobacco use there are no durable options for survival.  - No good options for improvement and very poor prognosis.  I suspect that the patient will expire over the next 24 hrs even with full support.  Palliative care is following.  We discussed the situation this morning, and I recommended comfort care.  He is going to have his family come in.  Palliative care to see and facilitate transition to comfort care if they choose this route.    2. CAD: s/p OOH inferolateral MI. Initial trop 28, then trended down.  No chest pain.   - Continue statin. No b-blocker with shock. ASA stopped with plts down to 35K.  - Cath 2/13 with occluded LAD and RCA. No interventional options   3. AKI/ESRD with uremia: likely due to ATN and hypoperfusion. Initial BUN/CR 198/3.91. Baseline creatinine normal several months ago. - Renal following CRRT started on 1/25, now on CVVHD. Anuric  - Renal feels that his kidneys are unlikely to recover. I agree. - Unable to pull fluid with hypotension.  - If patient opts for comfort care today, stop CVVH.   4. Acute hypoxic respiratory failure due to acute systolic HF: CXR with bilateral interstitial edema -  Remains on HFNC.  - Continue to try to pull fluid with CRRT until we transition to comfort care.   5. Shock liver  6. VT: Quiescent on amiodarone gtt and PO mexilitene. No further VT.   7. Ab pain and BRBPR - suspect ischemic colitis  8. Thrombocytopenia - PLTs dropping. HIT sent.  Will hold ASA now that 35K.   9. Hyperkalemia - continue CRRT as tolerated, K 4.3 after Lokelma yesterday.   10. Limited code - No intubation, CPR or code meds - DC-CV, pressors and Bipap ok - See above discussion about possible transition to comfort care today.   CRITICAL CARE Performed by: Loralie Champagne  Total critical care time: 35 minutes  Critical care time was exclusive of separately billable procedures and treating other patients.  Critical care was necessary to treat or prevent imminent or life-threatening deterioration.  Critical care was time spent personally by me (independent of midlevel providers or residents) on the following activities: development of treatment plan with patient and/or surrogate as well as nursing, discussions with consultants, evaluation of patient's response to treatment, examination of patient, obtaining history from patient or surrogate, ordering and performing treatments and  interventions, ordering and review of laboratory studies, ordering and review of radiographic studies, pulse oximetry and re-evaluation of patient's condition.  Loralie Champagne, MD  9:52 AM

## 2018-08-20 DIAGNOSIS — Z7189 Other specified counseling: Secondary | ICD-10-CM

## 2018-08-20 LAB — RENAL FUNCTION PANEL
Albumin: 1.9 g/dL — ABNORMAL LOW (ref 3.5–5.0)
Anion gap: 14 (ref 5–15)
BUN: 25 mg/dL — ABNORMAL HIGH (ref 8–23)
CO2: 20 mmol/L — AB (ref 22–32)
Calcium: 6.9 mg/dL — ABNORMAL LOW (ref 8.9–10.3)
Chloride: 92 mmol/L — ABNORMAL LOW (ref 98–111)
Creatinine, Ser: 2.55 mg/dL — ABNORMAL HIGH (ref 0.61–1.24)
GFR calc Af Amer: 30 mL/min — ABNORMAL LOW (ref >60)
GFR calc non Af Amer: 26 mL/min — ABNORMAL LOW (ref >60)
GLUCOSE: 292 mg/dL — AB (ref 70–99)
Phosphorus: 4.1 mg/dL (ref 2.5–4.6)
Potassium: 3.8 mmol/L (ref 3.5–5.1)
Sodium: 126 mmol/L — ABNORMAL LOW (ref 135–145)

## 2018-08-20 LAB — POCT ACTIVATED CLOTTING TIME
Activated Clotting Time: 191 seconds
Activated Clotting Time: 191 seconds
Activated Clotting Time: 197 seconds
Activated Clotting Time: 213 seconds
Activated Clotting Time: 213 seconds
Activated Clotting Time: 219 seconds
Activated Clotting Time: 224 seconds
Activated Clotting Time: 224 seconds
Activated Clotting Time: 241 seconds
Activated Clotting Time: 246 seconds
Activated Clotting Time: 257 seconds

## 2018-08-20 LAB — COOXEMETRY PANEL
Carboxyhemoglobin: 1.2 % (ref 0.5–1.5)
Methemoglobin: 2 % — ABNORMAL HIGH (ref 0.0–1.5)
O2 Saturation: 33.4 %
Total hemoglobin: 7.9 g/dL — ABNORMAL LOW (ref 12.0–16.0)

## 2018-08-20 LAB — MAGNESIUM: MAGNESIUM: 2.4 mg/dL (ref 1.7–2.4)

## 2018-08-20 MED ORDER — HYDROMORPHONE BOLUS VIA INFUSION
1.0000 mg | INTRAVENOUS | Status: DC | PRN
  Administered 2018-08-20: 2 mg via INTRAVENOUS
  Filled 2018-08-20: qty 2

## 2018-08-20 MED ORDER — HYDROMORPHONE HCL 1 MG/ML IJ SOLN
0.5000 mg | INTRAMUSCULAR | Status: DC | PRN
  Administered 2018-08-20: 0.5 mg via INTRAVENOUS
  Filled 2018-08-20: qty 0.5

## 2018-08-20 MED ORDER — LORAZEPAM 2 MG/ML IJ SOLN: 0.5000 mg | INTRAMUSCULAR | Status: DC | PRN

## 2018-08-20 MED ORDER — ALTEPLASE 2 MG IJ SOLR
2.0000 mg | Freq: Once | INTRAMUSCULAR | Status: AC
  Administered 2018-08-20: 2 mg

## 2018-08-20 MED ORDER — GLYCOPYRROLATE 0.2 MG/ML IJ SOLN
0.2000 mg | INTRAMUSCULAR | Status: DC
  Administered 2018-08-20: 0.2 mg via INTRAVENOUS
  Filled 2018-08-20: qty 1

## 2018-08-20 MED ORDER — SODIUM CHLORIDE 0.9 % IV SOLN
2.0000 mg/h | INTRAVENOUS | Status: DC
  Administered 2018-08-20: 2 mg/h via INTRAVENOUS
  Filled 2018-08-20: qty 2.5

## 2018-08-28 ENCOUNTER — Telehealth (HOSPITAL_COMMUNITY): Payer: Self-pay | Admitting: *Deleted

## 2018-08-28 NOTE — Telephone Encounter (Signed)
Death certificate signed and left at front desk for funeral home to pick up. Funeral home aware and will pick up tomorrow.

## 2018-09-02 NOTE — Progress Notes (Addendum)
Palliative:  HPI: 63 yo male with PMH HTN, HLD admitted 07/11/2018 with MI with symptoms over past ~5 days. EF found to be 10-15% and critically ill in ICU requiring CRRT, inotrope, and vasopressor with continued low co-ox, hypotension, and now worsening pain s/t to poor perfusion to extremities.   I have discussed with Samuel Bates and multiple family members at bedside (3 sons, 1 sister, cousins). Samuel Bates is very clear that his increasing pain and discomfort is now making aggressive measures not beneficial for him to continue. We discussed transition to comfort care with stopping CRRT and vasopressors and initiating hydromorphone infusion to ensure comfort. I was clear with himself and his family that prognosis could be only hours. I encouraged them to have everyone at bedside they desire before initiating this process. Family are appropriately tearful but accepting of patient wishes for transition to comfort care recognizing that he is at the end of his life.   Exam: Alert, oriented x 3. Pleasant. Weak. Cachectic. No distress.   Plan: - CRRT to be stopped now per patient wishes - PRN hydromorphone for pain control until full comfort measures begin - Robinul scheduled with anticipation of overload with CHF and stopping CRRT - Hydromorphone infusion and bolus scheduled to begin this afternoon ~1700 per patient wishes (stop vasopressors/inotropes at this time once he is more comfortable) - Once family arrives and patient ready pursue full comfort measures per patient wishes (reaffirmed this decision/plan with family this afternoon)  36 min  Vinie Sill, NP Palliative Medicine Team Pager # [phone] (M-F 8a-5p) Team Phone # [phone] (Nights/Weekends)

## 2018-09-02 NOTE — Progress Notes (Signed)
Received call back from [facility] in house fellow.  Patient's death reported.

## 2018-09-02 NOTE — Progress Notes (Signed)
Patient ID: Samuel Bates, male   DOB: 01-Aug-1955, 63 y.o.   MRN: [id number]     Advanced Heart Failure Rounding Note  PCP-Cardiologist: No primary care provider on file.   Subjective:    Cath 2/13 with occluded RCA and LAD low output (co-ox 34%) and elevated filling pressures  NE up to 68 and dobutamine 2.5. Co-ox 33%.  CVVH ongoing now slightly negative. CVP 15.  Weight down 2 pounds.   PLTs 59-> 45k -> 35k. -> pending   Feels weak. Severe pain in fingertips and toes. No CP or SOB.   Objective:   Weight Range: 70.9 kg Body mass index is 19.54 kg/m.   Vital Signs:   Temp:  [97.3 F (36.3 C)-97.5 F (36.4 C)] 97.3 F (36.3 C) (02/17 0730) Pulse Rate:  [41-88] 41 (02/17 0830) Resp:  [14-33] 20 (02/17 0830) BP: (70-98)/(40-83) 93/70 (02/17 0830) SpO2:  [84 %-100 %] 92 % (02/17 0830) Weight:  [70.9 kg] 70.9 kg (02/17 0500) Last BM Date: Sep 01, 2018  Weight change: Filed Weights   08/18/18 0600 08/19/18 0500 September 01, 2018 0500  Weight: 69.3 kg 72 kg 70.9 kg   Intake/Output:   Intake/Output Summary (Last 24 hours) at 09-01-2018 0847 Last data filed at September 01, 2018 0800 Gross per 24 hour  Intake 1673.66 ml  Output 1441 ml  Net 232.66 ml    Physical Exam   General:  Weak appearing. No resp difficulty HEENT: normal Neck: supple. JVP to jaw. Carotids 2+ bilat; no bruits. No lymphadenopathy or thryomegaly appreciated. Cor: PMI nondisplaced. Regular rate & rhythm. +s3  Right chest HD cath Lungs: clear Abdomen: soft, nontender, nondistended. No hepatosplenomegaly. No bruits or masses. Good bowel sounds. Extremities: no cyanosis, clubbing, rash, edema Neuro: alert & orientedx3, cranial nerves grossly intact. moves all 4 extremities w/o difficulty. Affect pleasant  Telemetry   NSR 80s personally reviewed.  Labs    CBC Recent Labs    08/18/18 0325 08/19/18 0315  WBC 9.3 9.9  HGB 8.9* 8.1*  HCT 29.1* 27.1*  MCV 77.0* 79.0*  PLT 45* 35*   Basic Metabolic Panel Recent  Labs    08/18/18 0325 08/19/18 0315 01-Sep-2018 0500  NA 130* 130* 126*  K 5.7* 4.3 3.8  CL 95* 96* 92*  CO2 20* 23 20*  GLUCOSE 119* 124* 292*  BUN 48* 32* 25*  CREATININE 4.56* 3.04* 2.55*  CALCIUM 7.6* 7.3* 6.9*  MG 2.7* 2.5*  --   PHOS 4.7* 3.7 4.1   Liver Function Tests Recent Labs    08/19/18 0315 09-01-18 0500  ALBUMIN 2.1* 1.9*   No results for input(s): LIPASE, AMYLASE in the last 72 hours. Cardiac Enzymes No results for input(s): CKTOTAL, CKMB, CKMBINDEX, TROPONINI in the last 72 hours.  BNP: BNP (last 3 results) Recent Labs    07/14/2018 1651  BNP 4,188.4*    ProBNP (last 3 results) No results for input(s): PROBNP in the last 8760 hours.   D-Dimer No results for input(s): DDIMER in the last 72 hours. Hemoglobin A1C No results for input(s): HGBA1C in the last 72 hours. Fasting Lipid Panel No results for input(s): CHOL, HDL, LDLCALC, TRIG, CHOLHDL, LDLDIRECT in the last 72 hours. Thyroid Function Tests No results for input(s): TSH, T4TOTAL, T3FREE, THYROIDAB in the last 72 hours.  Invalid input(s): FREET3  Other results:   Imaging    No results found.   Medications:     Scheduled Medications: . sodium chloride   Intravenous Once  . amiodarone  200 mg Oral  BID  . atorvastatin  80 mg Oral q1800  . chlorhexidine  15 mL Mouth Rinse BID  . Chlorhexidine Gluconate Cloth  6 each Topical Daily  . darbepoetin (ARANESP) injection - DIALYSIS  200 mcg Intravenous Q Thu-HD  . feeding supplement (ENSURE ENLIVE)  237 mL Oral BID BM  . feeding supplement (PRO-STAT SUGAR FREE 64)  30 mL Oral BID  . mouth rinse  15 mL Mouth Rinse BID  . mexiletine  150 mg Oral Q8H  . midodrine  15 mg Oral TID WC  . pantoprazole  40 mg Oral BID  . sodium chloride flush  3 mL Intravenous Q12H  . sodium chloride flush  3 mL Intravenous Q12H    Infusions: . sodium chloride    . sodium chloride Stopped (08/14/18 0727)  . sodium chloride    . sodium chloride    . sodium  chloride    . DOBUTamine 2.5 mcg/kg/min (09-03-2018 0800)  . heparin 10,000 units/ 20 mL infusion syringe 1,150 Units/hr (Sep 03, 2018 0800)  . norepinephrine (LEVOPHED) Adult infusion 68 mcg/min (09-03-2018 0800)  . prismasol BGK 2/2.5 dialysis solution 1,500 mL/hr at 09/03/18 0754  . prismasol BGK 2/2.5 replacement solution 200 mL/hr at 08/19/18 1220  . prismasol BGK 2/2.5 replacement solution 200 mL/hr at 08/19/18 1221    PRN Medications: Place/Maintain arterial line **AND** sodium chloride, sodium chloride, sodium chloride, sodium chloride, sodium chloride, acetaminophen, heparin, loperamide HCl, magic mouthwash, nitroGLYCERIN, ondansetron (ZOFRAN) IV, promethazine, promethazine, sodium chloride, sodium chloride flush, sodium chloride flush   Patient Profile   63 y.o.malewith past medical history of hypertension, hyperlipidemia admitted with recent but late presenting inferior lateral myocardial infarction, cardiogenic shock, acute renal failure and microcytic anemia. Echocardiogram showed severe LV and RV dysfunction and moderate mitral regurgitation.  Assessment/Plan   1. Acute systolic HF with cardiogenic shock due to OOH MI: Ischemic cardiomyopathy, echo 1/5 EF 15% severe RV dysfunction and moderate MR.   - Now on norepi 68 mcg + dobutamine 2.5 mcg + midodrine 15 mg tid.  - Co-ox markedly low at 33% c/w cardiogenic shock despite high-dose NE and dobutamine.  - Now with intractable ischemic pain in fingers and toes. Will cut NE down. If pain persists or SBP drops only option will be to transition to comfort care later today.  - CVP 15, remains on CRRT for now - Cath 2/13 with occluded LAD and RCA - Dr. Haroldine Laws has discussed case with our interventional team, [facility] and the [facility]. In light of his ESRD and recent tobacco use there are no durable options for survival.  - No good options for improvement and very poor prognosis.  I suspect that the patient will expire over  the next 24 hrs even with full support.  Palliative care is following.  We discussed the situation this morning, and I recommended comfort care.  He is going to have his family come in.  Palliative care to see and facilitate transition to comfort care if they choose this route.   2. CAD: s/p OOH inferolateral MI. Initial trop 28, then trended down.  No chest pain.   - Continue statin. No b-blocker with shock. ASA stopped with plts down to 35K.  - Cath 2/13 with occluded LAD and RCA. No interventional options   3. AKI/ESRD with uremia: likely due to ATN and hypoperfusion. Initial BUN/CR 198/3.91. Baseline creatinine normal several months ago. - Renal following CRRT started on 1/25, now on CVVHD. Anuric  - Renal feels that his  kidneys are unlikely to recover. I agree - If patient opts for comfort care today, stop CVVH.   4. Acute hypoxic respiratory failure due to acute systolic HF: CXR with bilateral interstitial edema - Remains on HFNC.  - Continue to try to pull fluid with CRRT until we transition to comfort care.   5. Shock liver  6. VT: Quiescent on amiodarone gtt and PO mexilitene. No further VT.   7. Ab pain and BRBPR - suspect ischemic colitis  8. Thrombocytopenia - PLTs dropping. HIT sent.  Off ASA now that 35K.   9. Hyperkalemia - continue CRRT as tolerated, K 3.8 today with CRRT   10. Limited code - No intubation, CPR or code meds - DC-CV, pressors and Bipap ok - See above discussion about possible transition to comfort care today.   CRITICAL CARE Performed by: Glori Bickers  Total critical care time: 35 minutes  Critical care time was exclusive of separately billable procedures and treating other patients.  Critical care was necessary to treat or prevent imminent or life-threatening deterioration.  Critical care was time spent personally by me (independent of midlevel providers or residents) on the following activities: development of treatment plan with  patient and/or surrogate as well as nursing, discussions with consultants, evaluation of patient's response to treatment, examination of patient, obtaining history from patient or surrogate, ordering and performing treatments and interventions, ordering and review of laboratory studies, ordering and review of radiographic studies, pulse oximetry and re-evaluation of patient's condition.   Glori Bickers, MD  8:47 AM

## 2018-09-02 NOTE — Progress Notes (Signed)
30 ml of Dilaudid drip wasted in Proofreader with 2nd RN witness, Writer.

## 2018-09-02 NOTE — Progress Notes (Signed)
Patient noted to be asystole on the monitor.  On assessment no respirations noted, no heart sounds.  Confirmed by Talbert Forest, RN.  Patient deceased. Time of death 68. Jenkins Rouge, MD and [facility] in house fellow notified via text page. Awaiting call back.Marland Kitchen

## 2018-09-02 NOTE — Discharge Summary (Addendum)
  Advanced Heart Failure Death Summary  Death Summary   Patient ID: Samuel Bates MRN: [id number], DOB/AGE: 63/12/1955 63 y.o. Admit date: 07/21/2018 D/C date:     08/21/2018   Primary Discharge Diagnoses:  1. Acute biventricular CHF with cardiogenic shock due to OOH MI:  2. Ischemic cardiomyopathy 3. CAD: s/p OOH inferolateral MI 4. AKI/ESRD requiring dialysis with uremia 5. Acute hypoxic respiratory failure due to acute systolic HF 6. Shock liver 7. VT 8. Ab pain and BRBPR -> Suspected ischemic colitis 9. Thrombocytopenia 10. Hyperkalemia  Hospital Course:   Harlyn Italiano was a 63 y.o. male with past medical history of HTN and HLD.  He was admitted 07/12/2018 with a late presenting inferior lateral MI complicated by cardiogenic shock, acute renal failure, lactic acidosis, and microcytic anemia.   Echo on arrival showed significant, biventricular dysfunction with an LVEF or 10-15% and severely reduced RV. Initial troponin 38.    Initial BUN/CR 198/3.91 so no coronary angiography performed initially and patient started on CRT 1/25 via RFV cath after unsuccessful attempts to get IJ access bilaterally. Hypoxic respiratory failure noted in setting of CHF and started on HFNC. Initial coox 29%, but drawn from femoral line. Started on Milrinone and NE.    He had several episodes of VT and was started on amiodarone. Milrinone switched to Dobutamine.   Pt continued to require increasing pressor support, and showed no renal recovery, remaining essentially anuric (apart from several days of oliguria) on CVVHD. Shock liver somewhat improved with pressor support.  Pt gradually weaned off of inotrope support, but continued to require midodrine and NE.  Dobutamine added back however on 2/11 with Coox 35.6% despite NE.   With no renal recovery, declared as ESRD and taken for Right heart cath and coronary angiography 2/13 which showed occluded RCA and LAD and low output despite pressor support.    Pt  discussed with interventional team here at [facility], T[facility], and the [facility]. It was felt that in light of the patients ESRD and recent tobacco use, he had no durable options for survival. This was discussed with family, and continued with a longer discussion 08/17/2018 evening by Dr. Haroldine Laws. Pt elected to be DNR/DNI with the potential for comfort meds if he started to get worse.   Over the following days, Pt developed worsening abdominal pain and severe pain in fingertips and toes, all indicated ischemia due to increased pressor support with patient requiring Norepinephrine up to 68 mcg. Pt had long discussion with family and palliative care team, and decided to begin comfort care at approximately 1700 on 07/16/19. Pt passed away within several hours with family at bedside. Noted to be asystole with no breath or heart sounds at [place].    Duration of Discharge Encounter: Greater than 35 minutes   Signed, Shirley Friar, PA-C 08/21/2018, 7:06 AM  Agree with above.   Glori Bickers, MD  5:25 PM

## 2018-09-02 DEATH — deceased

## 2019-04-26 IMAGING — US US RENAL
1 series · 14 of 25 positions shown · non-contrast
Comparison: None.

CLINICAL DATA: Acute kidney injury

EXAM:
RENAL / URINARY TRACT ULTRASOUND COMPLETE

[Series 1: us renal · 14 of 26 slices shown]
[im 1/26]
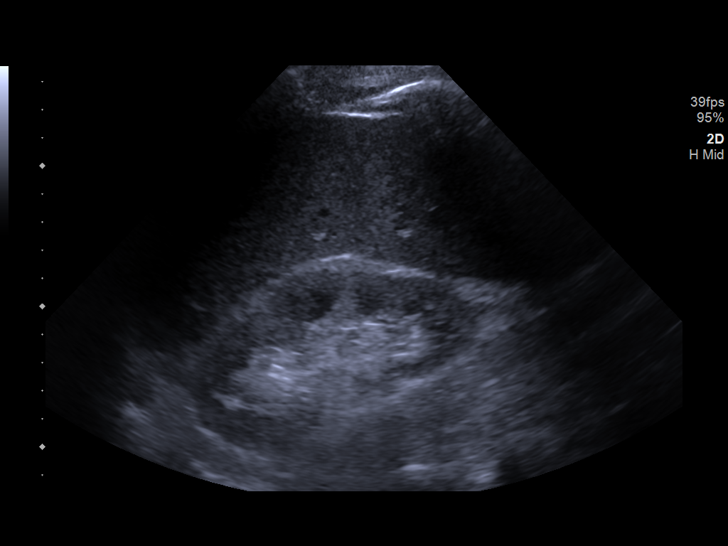
[im 3/26]
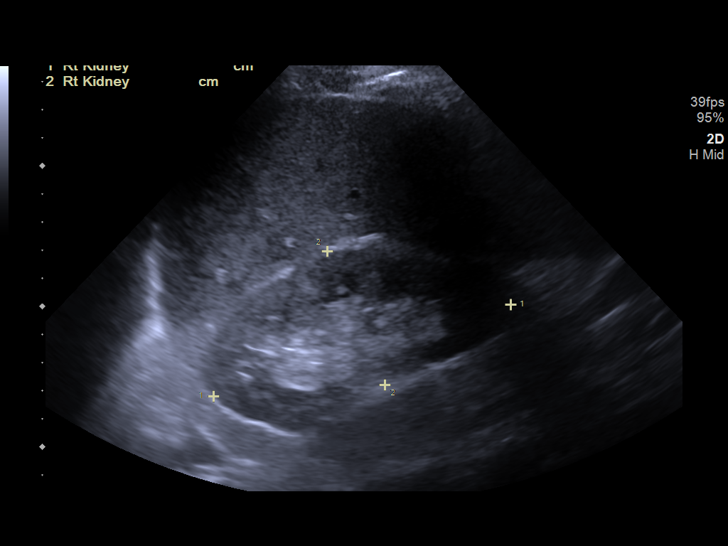
[im 5/26]
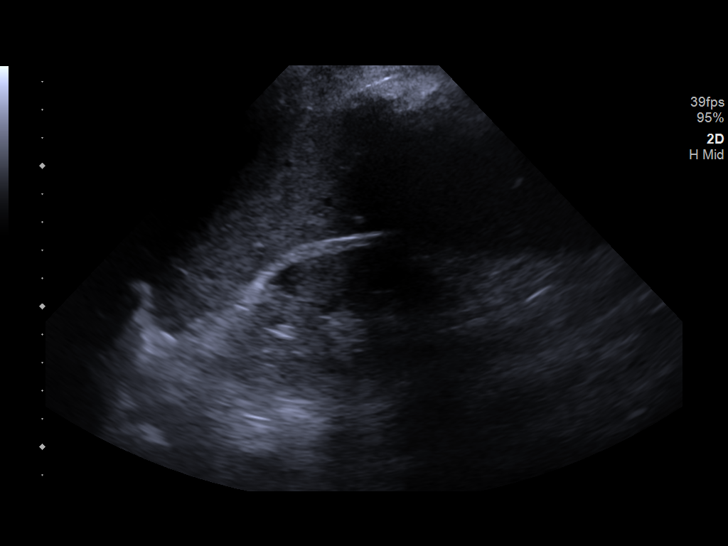
[im 7/26]
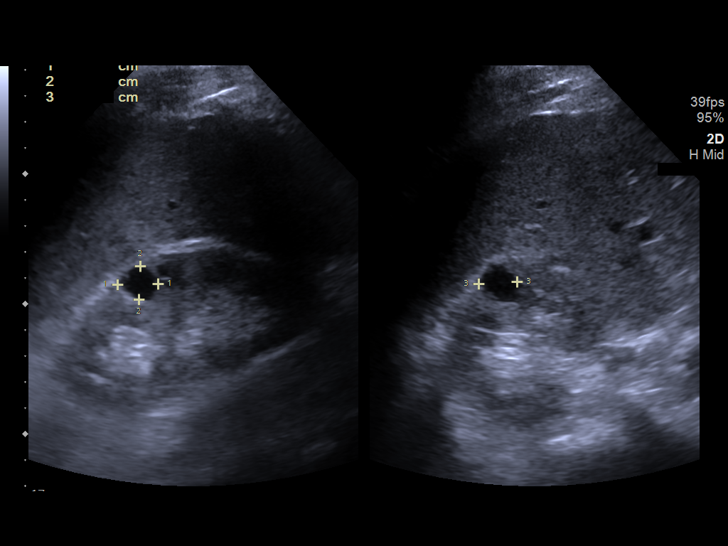
[im 9/26]
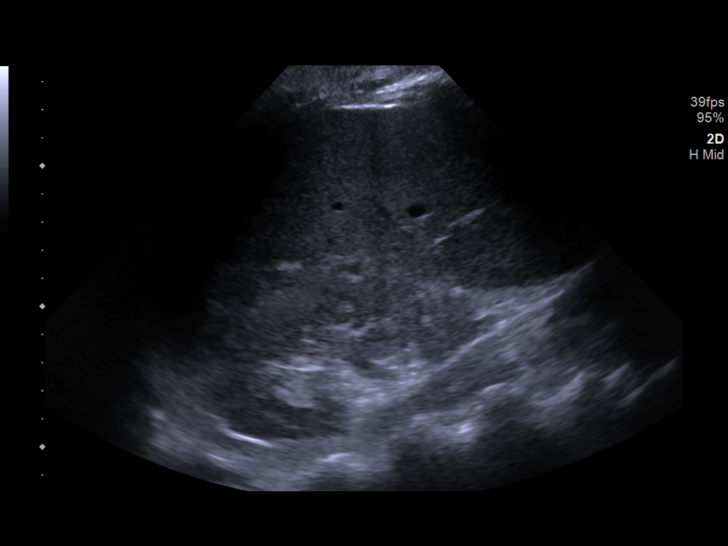
[im 10/26]
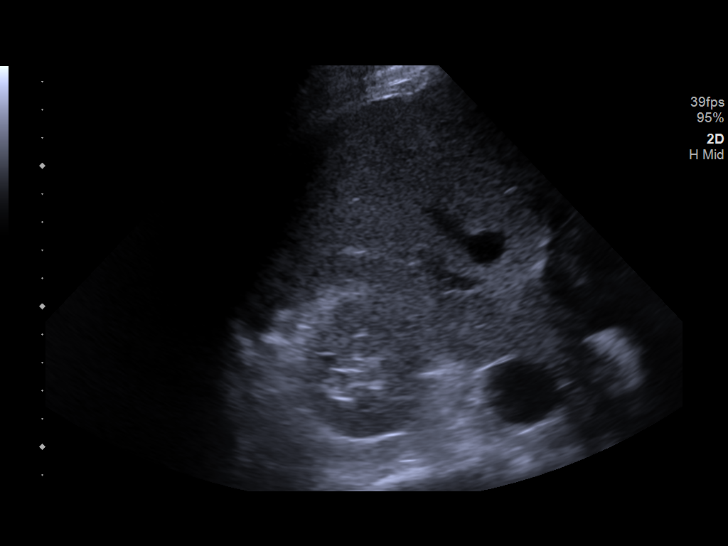
[im 12/26]
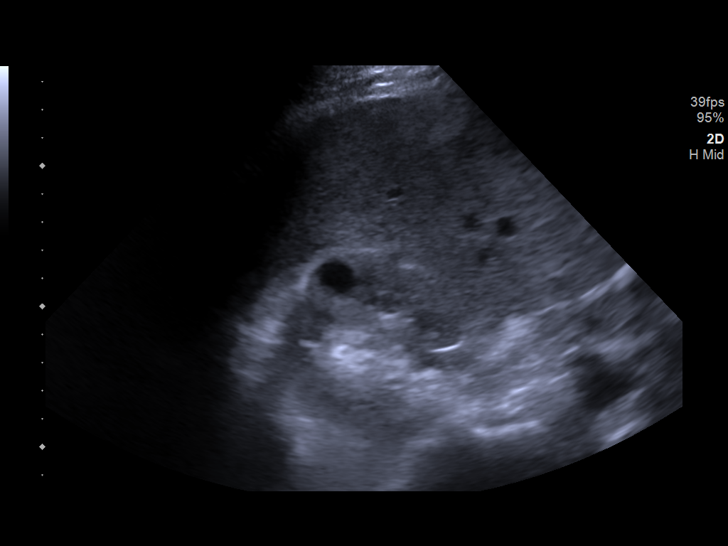
[im 14/26]
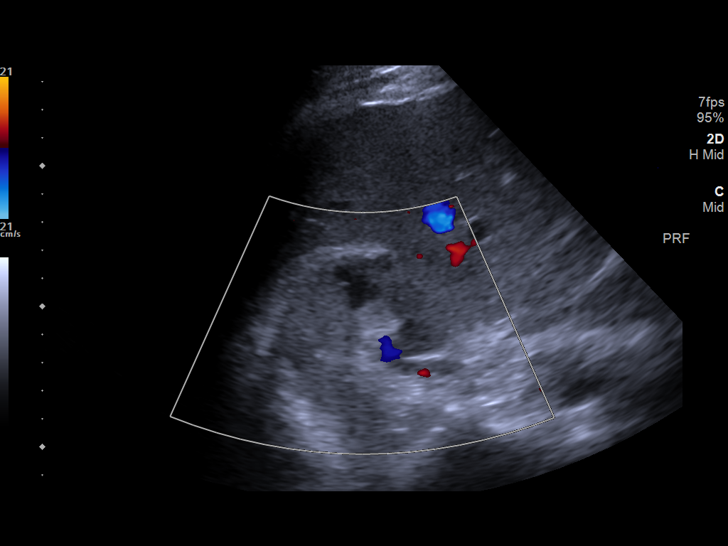
[im 16/26]
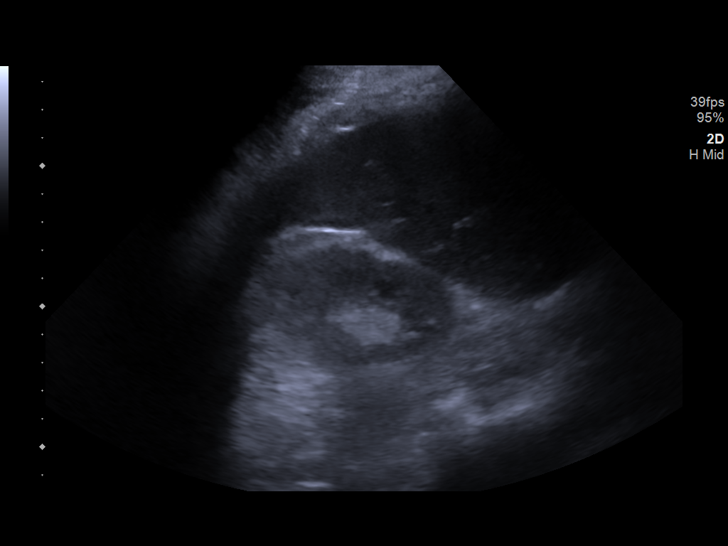
[im 17/26]
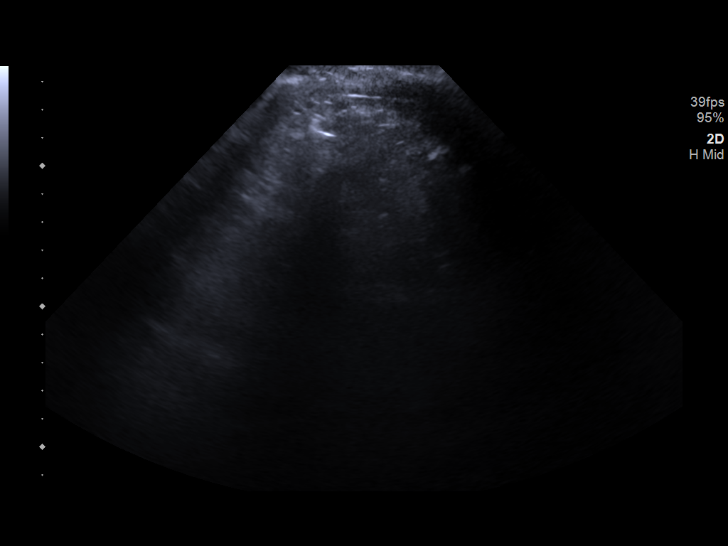
[im 19/26]
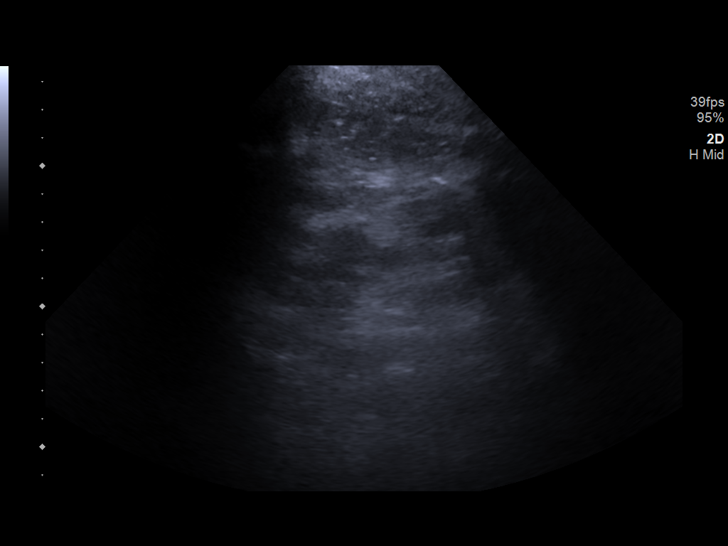
[im 21/26]
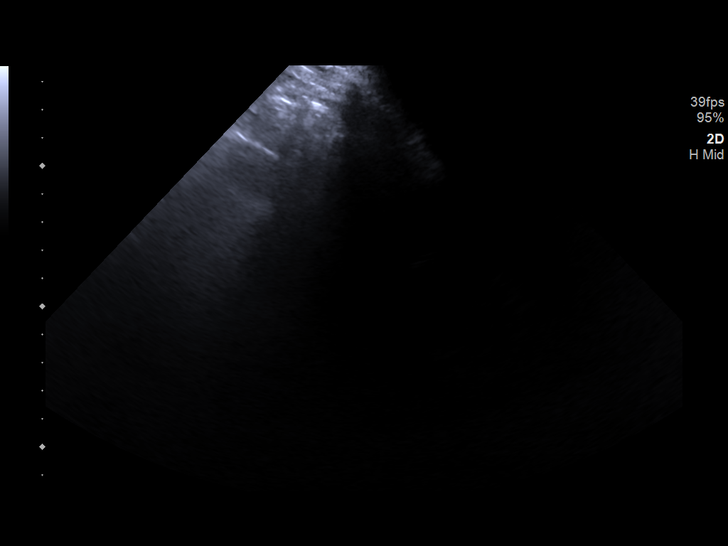
[im 23/26]
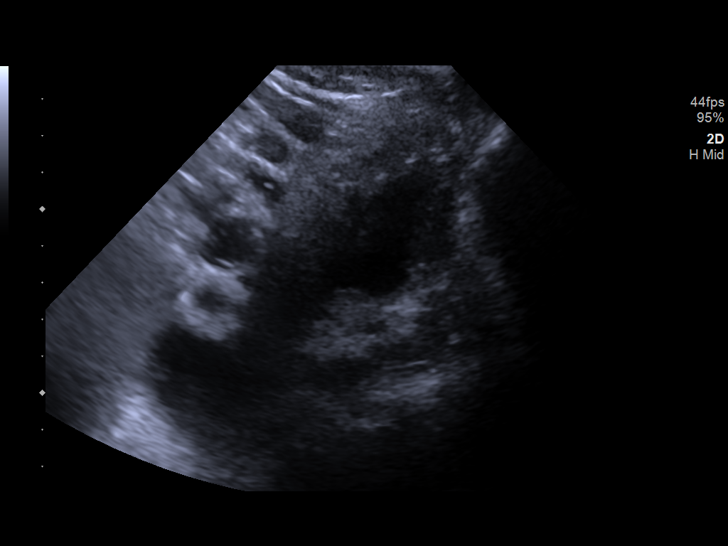
[im 26/26]
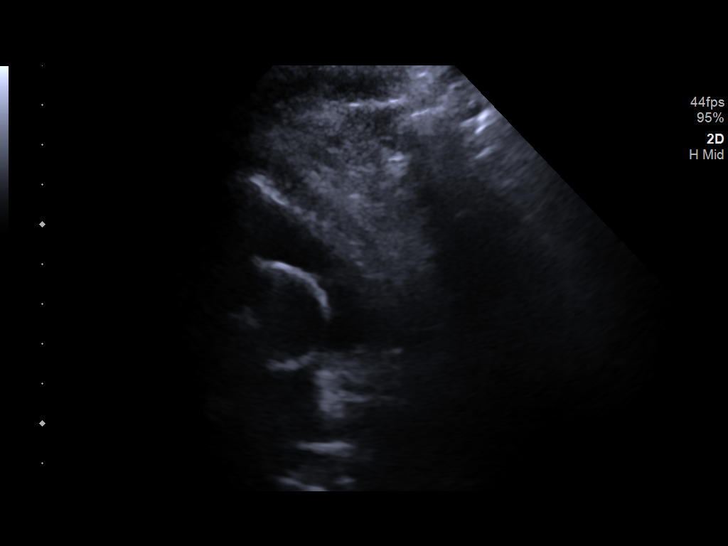

[14 of 25 positions shown; findings below may reference images not displayed]

FINDINGS: Right Kidney:

Renal measurements: 11.1 cm. Echogenicity mildly increased. No mass
or hydronephrosis visualized. Upper pole cyst measures 1.6 x 1.3 x
1.5 cm.

Left Kidney:

Not visualized due to inability of patient to be positioned
adequately.

Bladder:

Decompressed by Foley catheter.
IMPRESSION: 1. Mildly echogenic right kidney, compatible with chronic medical
renal disease.
2. Nonvisualization of the left kidney.

## 2019-05-05 IMAGING — US IR FLUORO GUIDE CV LINE*R*
1 series · 1 of 1 positions shown · non-contrast
Comparison: none

INDICATION: 62-year-old male with a history of renal failure

[Series 1: ir fluoro guide cv line*right* · 1 of 1 slices shown]
[im 1/1]
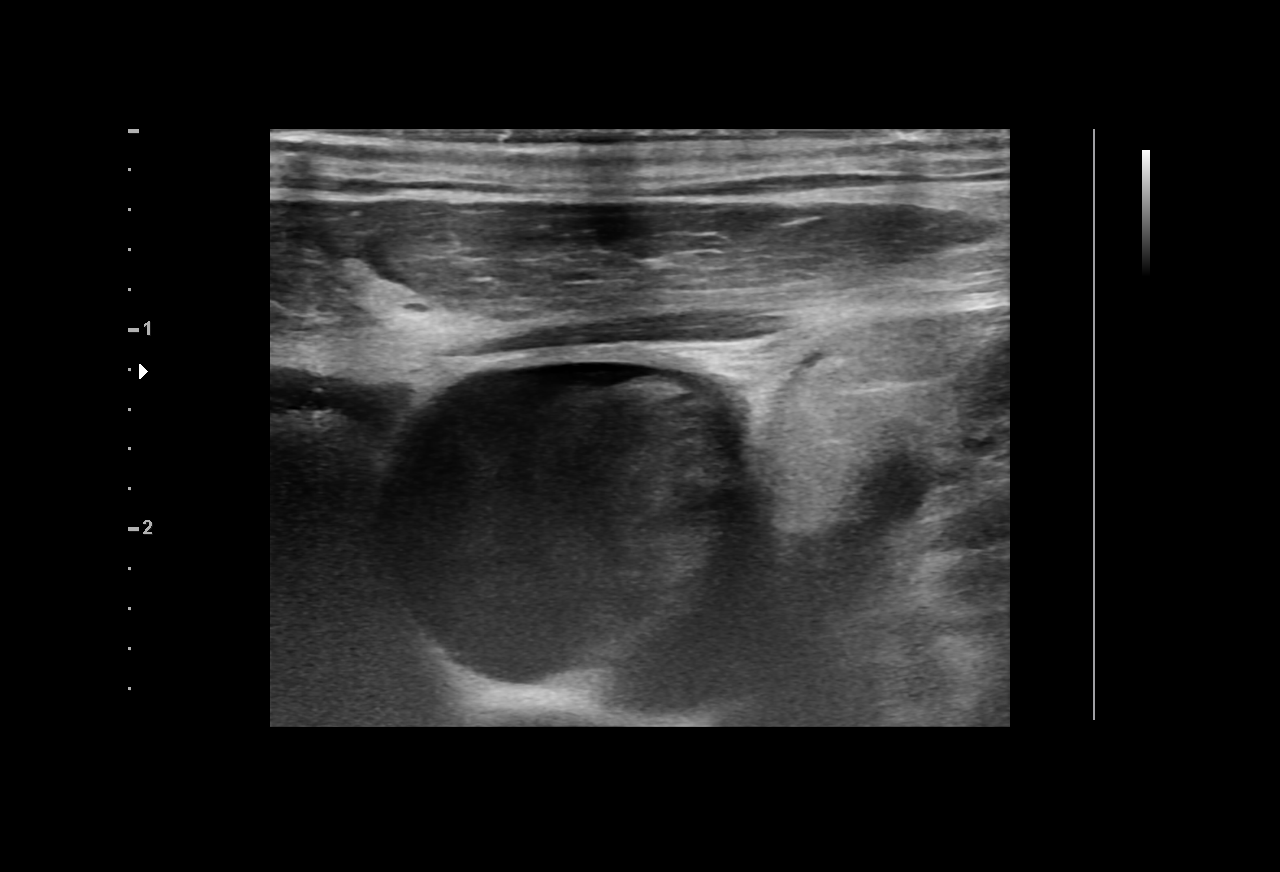

[1 of 1 positions shown; findings below may reference images not displayed]

EXAM:
TUNNELED PICC LINE WITH ULTRASOUND AND FLUOROSCOPIC GUIDANCE

MEDICATIONS:
Vancomycin 1 gm IV. The antibiotic was given in an appropriate time
interval prior to skin puncture.

ANESTHESIA/SEDATION:
Versed 1.5 mg IV; Fentanyl 75 mcg IV;

Moderate Sedation Time:  17 minutes

The patient was continuously monitored during the procedure by the
interventional radiology nurse under my direct supervision.

FLUOROSCOPY TIME:  Fluoroscopy Time: 0 minutes 36 seconds (3 mGy).

COMPLICATIONS:
None

PROCEDURE:
The procedure, risks, benefits, and alternatives were explained to
the patient. Questions regarding the procedure were encouraged and
answered. The patient understands and consents to the procedure.

The right neck and chest was prepped with chlorhexidine, and draped
in the usual sterile fashion using maximum barrier technique (cap
and mask, sterile gown, sterile gloves, large sterile sheet, hand
hygiene and cutaneous antiseptic). Antibiotic prophylaxis was
initiated with 1.0g vancomycin administered IV within 1 hour prior
to skin incision. Local anesthesia was attained by infiltration with
1% lidocaine without epinephrine.

Ultrasound demonstrated patency of the right internal jugular vein,
and this was documented with an image. Under real-time ultrasound
guidance, this vein was accessed with a 21 gauge micropuncture
needle and image documentation was performed. A small dermatotomy
was made at the access site with an 11 scalpel. A 0.018" wire was
advanced into the SVC and the access needle exchanged for a 4F
micropuncture vascular sheath. The 0.018" wire was then removed and
a 0.035" wire advanced into the IVC. Upon withdrawal of the 018
wire, the wire was marked for appropriate length of the internal
portion of the catheter.

A 23 cm tip to cuff split tip catheter was selected. The skin and
subcutaneous tissues of the right chest wall were then generously
infiltrated with 1% lidocaine without epinephrine from the chest
wall to the puncture site at the right IJ. The hemodialysis catheter
was then back tunneled from the right chest wall incision to the
dermatotomy at the right neck.

The venous access site was then serially dilated and a peel away
vascular sheath placed over the wire. The wire was removed and the
catheter was placed through the peel-away sheath. The catheter tip
is positioned in the upper right atrium. This was documented with a
spot image. Both ports of the hemodialysis catheter were then tested
for excellent function. The ports were then locked with heparinized
lock.

The incision at the neck was then sealed with Dermabond.

Patient tolerated the procedure well and remained hemodynamically
stable throughout.

No complications were encountered and no significant blood loss was
encountered.
FINDINGS: After catheter placement, the tip lies within the superior
cavoatrial junction. The catheter aspirates and flushes normally and
is ready for immediate use.
IMPRESSION: Status post right IJ tunneled hemodialysis catheter measuring 23 cm
tip to cuff. Catheter ready for use.

## 2019-05-07 IMAGING — DX DG CHEST 1V PORT
1 series · 1 of 1 positions shown · non-contrast
Comparison: Chest x-rays dated 08/03/2018 and 07/28/2018.

CLINICAL DATA: Hypoxia, history of hypertension, dialysis.

EXAM:
PORTABLE CHEST 1 VIEW

[chest ap]
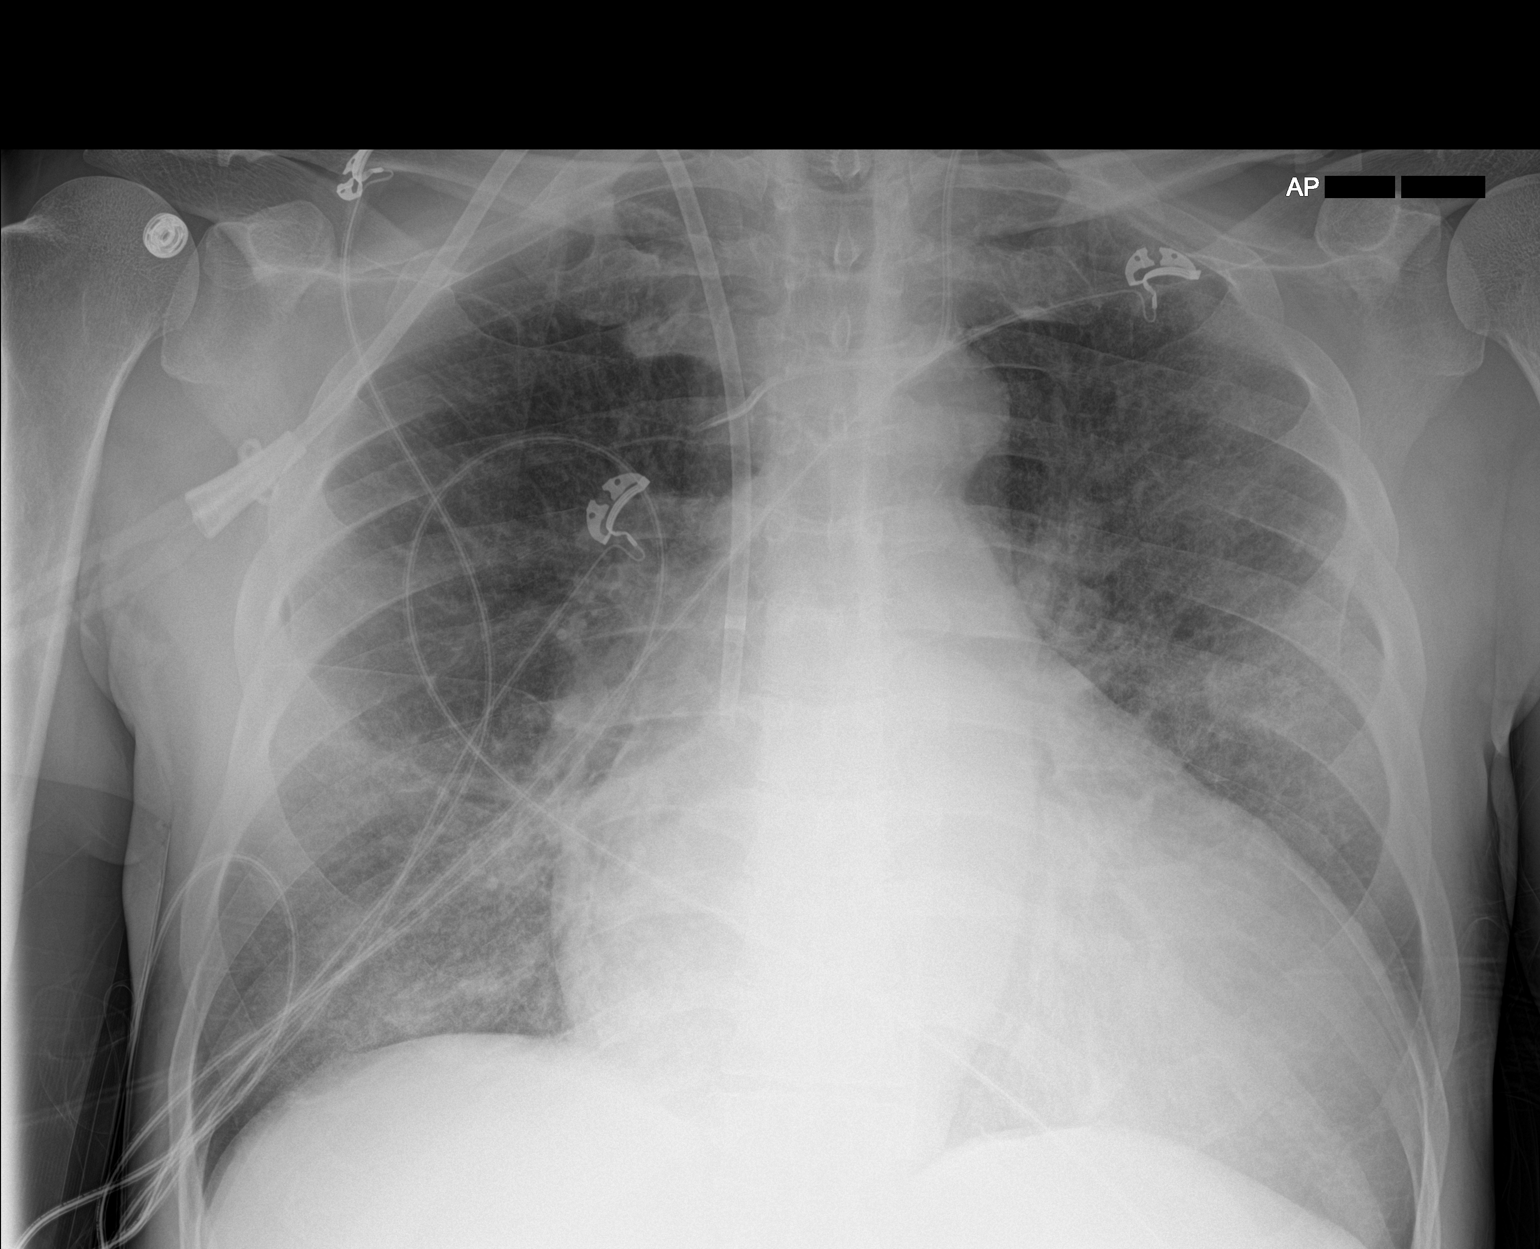

[1 of 1 positions shown; findings below may reference images not displayed]

FINDINGS: Stable cardiomegaly. RIGHT-sided dialysis catheter in place with tip
adequately positioned at the level of the lower SVC. LEFT IJ central
line has been repositioned with tip now at the level of the upper
SVC.

Increased central pulmonary vascular congestion and bilateral
interstitial edema. No pleural effusion or pneumothorax seen.
Osseous structures about the chest are unremarkable. Don Lolito
IMPRESSION: 1. Cardiomegaly with central pulmonary vascular congestion and
bilateral interstitial edema indicating CHF/volume overload.
2. LEFT IJ central line has been repositioned with tip now at the
level of the upper SVC.

## 2019-05-08 IMAGING — DX DG CHEST 1V PORT
1 series · 1 of 1 positions shown · non-contrast
Comparison: 08/09/2018

CLINICAL DATA: Increased oxygen requirement, shortness of breath

EXAM:
PORTABLE CHEST 1 VIEW

[chest ap]
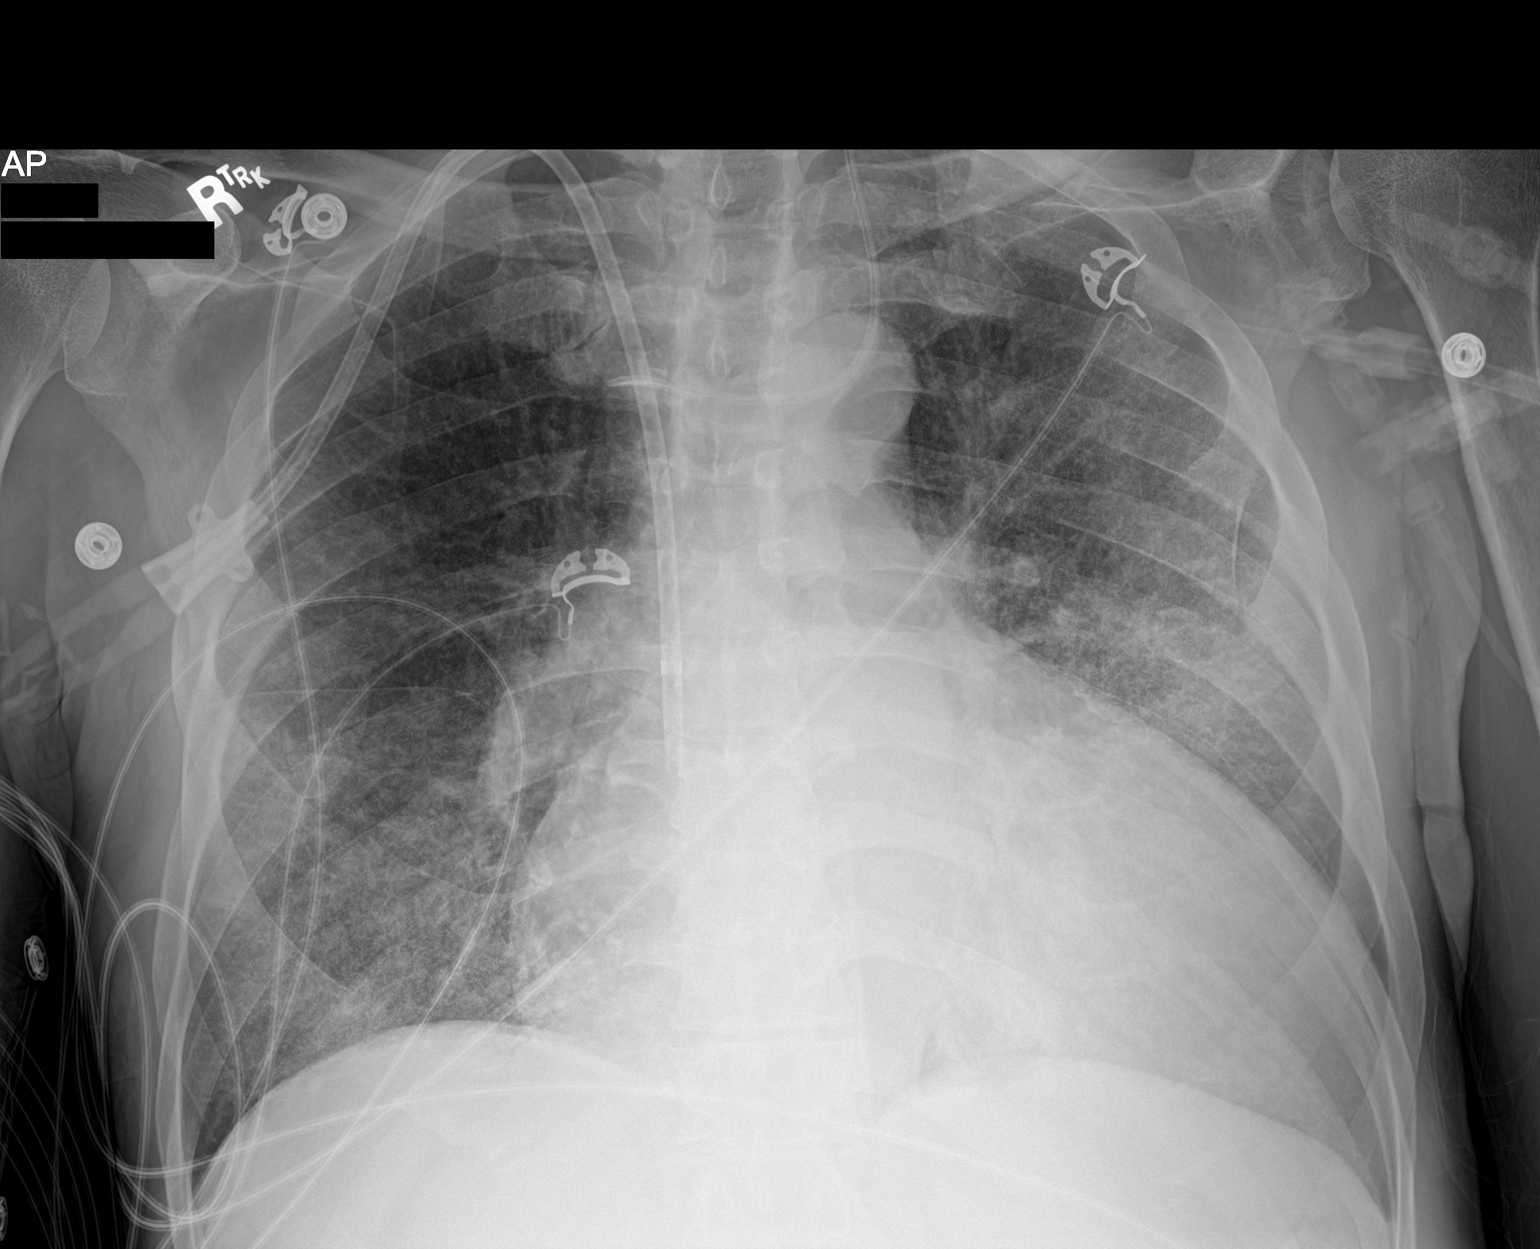

[1 of 1 positions shown; findings below may reference images not displayed]

FINDINGS: No significant change in AP portable examination with diffuse
interstitial pulmonary opacity, likely edema, gross cardiomegaly,
and vascular catheters. A left neck vascular catheter remains with
tip directed likely into the azygos vein. Unchanged gross
cardiomegaly and/or pericardial effusion.
IMPRESSION: No significant change in AP portable examination with diffuse
interstitial pulmonary opacity, likely edema, gross cardiomegaly,
and vascular catheters. A left neck vascular catheter remains with
tip directed likely into the azygos vein. Unchanged gross
cardiomegaly and/or pericardial effusion.
